# Patient Record
Sex: Male | Born: 1945 | Race: Black or African American | Hispanic: No | Smoking: Current every day smoker
Health system: Southern US, Community
[De-identification: ages and names within clinical notes are randomized; demographics above are authoritative.]

## PROBLEM LIST (undated history)

## (undated) DIAGNOSIS — L409 Psoriasis, unspecified: Secondary | ICD-10-CM

## (undated) DIAGNOSIS — F329 Major depressive disorder, single episode, unspecified: Secondary | ICD-10-CM

## (undated) DIAGNOSIS — E119 Type 2 diabetes mellitus without complications: Secondary | ICD-10-CM

## (undated) DIAGNOSIS — L989 Disorder of the skin and subcutaneous tissue, unspecified: Secondary | ICD-10-CM

## (undated) HISTORY — DX: Type 2 diabetes mellitus without complications: E11.9

## (undated) HISTORY — DX: Psoriasis, unspecified: L40.9

## (undated) HISTORY — DX: Major depressive disorder, single episode, unspecified: F32.9

---

## 1993-04-12 HISTORY — PX: COLONOSCOPY: SHX174

## 2011-09-29 ENCOUNTER — Ambulatory Visit: Payer: Self-pay | Admitting: Family Medicine

## 2012-05-11 ENCOUNTER — Emergency Department: Payer: Self-pay | Admitting: Emergency Medicine

## 2012-05-21 LAB — WOUND CULTURE

## 2014-09-16 ENCOUNTER — Encounter (INDEPENDENT_AMBULATORY_CARE_PROVIDER_SITE_OTHER): Payer: Self-pay

## 2014-09-16 ENCOUNTER — Encounter: Payer: Self-pay | Admitting: Internal Medicine

## 2014-09-16 ENCOUNTER — Ambulatory Visit (INDEPENDENT_AMBULATORY_CARE_PROVIDER_SITE_OTHER): Payer: PPO | Admitting: Internal Medicine

## 2014-09-16 VITALS — BP 136/70 | HR 104 | Ht 69.0 in | Wt 95.4 lb

## 2014-09-16 DIAGNOSIS — E119 Type 2 diabetes mellitus without complications: Secondary | ICD-10-CM

## 2014-09-16 DIAGNOSIS — R634 Abnormal weight loss: Secondary | ICD-10-CM | POA: Diagnosis not present

## 2014-09-16 DIAGNOSIS — L409 Psoriasis, unspecified: Secondary | ICD-10-CM

## 2014-09-16 MED ORDER — CEPHALEXIN 500 MG PO CAPS
500.0000 mg | ORAL_CAPSULE | Freq: Four times a day (QID) | ORAL | Status: DC
Start: 2014-09-16 — End: 2014-12-15

## 2014-09-16 MED ORDER — METFORMIN HCL 500 MG PO TABS: 500.0000 mg | ORAL_TABLET | Freq: Two times a day (BID) | ORAL | Status: DC

## 2014-09-16 NOTE — Progress Notes (Signed)
Date:  09/16/2014   Name:  Stephen [name].   DOB:  1946/03/14   MRN:  [id number]  History of Present Illness:  This is a 69 y.o. male who is presenting with bleeding.  He fell yesterday in his house and hit his left side.  He noticed bruising and bleeding on ribs.  Now there is tenderness but no bleeding. It hurts to breath but he does not think his ribs are broken.  Excessive weight loss - weight 128 lbs in 07/2013 (last visit).  Diabetes - currently on metformin daily.  He only checks BS occasionally - sometimes 130 often higher.  The highest recent reading is 190.  His weight is down 35 lbs which he attributes to poor appetite.  He does not have any financial issues that are contributing.  No results found for: HGBA1C  Psoriasis - severe involvement of both hands.  In the past only responded to keflex 500 mg qid for 2 weeks.  Currently his only applying onion juice.  Cortisone topically has not worked for him.  HPI  Review of Systems:  Review of Systems  Constitutional: Positive for appetite change and unexpected weight change.  Respiratory: Negative for cough, chest tightness and shortness of breath.   Gastrointestinal: Negative for nausea, vomiting, abdominal pain, diarrhea and blood in stool.  Skin: Positive for rash (thickening of both palms).  Neurological: Negative for syncope and light-headedness.  Psychiatric/Behavioral: Positive for sleep disturbance. Negative for dysphoric mood.    Patient Active Problem List   Diagnosis Date Noted  . Diabetes 09/16/2014  . Psoriasis 09/16/2014  . Excessive weight loss 09/16/2014    Prior to Admission medications   Medication Sig Start Date End Date Taking? Authorizing Provider  metFORMIN (GLUCOPHAGE) 500 MG tablet Take 1 mg by mouth 2 (two) times daily with a meal.   Yes Historical Provider, MD    No Known Allergies  No past surgical history on file.  History  Substance Use Topics  . Smoking status: Current Some Day Smoker    . Smokeless tobacco: Not on file  . Alcohol Use: 0.0 oz/week    0 Standard drinks or equivalent per week     Comment: socially     Medication list has been reviewed and updated.  Physical Examination:  Physical Exam  Constitutional: He is oriented to person, place, and time. He appears cachectic.  Neck: No thyromegaly present.  Cardiovascular: Normal rate, regular rhythm, normal heart sounds and intact distal pulses.   Pulmonary/Chest: No respiratory distress. He has no wheezes. He has no rales.  Abdominal: Soft. Bowel sounds are normal. He exhibits no mass. There is no tenderness.  Musculoskeletal: Normal range of motion. He exhibits tenderness (over left axillary ribs). He exhibits no edema.  Lymphadenopathy:    He has no cervical adenopathy.  Neurological: He is alert and oriented to person, place, and time.  Skin: No purpura noted. Rash is not pustular.       BP 136/70 mmHg  Pulse 104  Ht 5' 9"  (1.753 m)  Wt 95 lb 6.4 oz (43.273 kg)  BMI 14.08 kg/m2  Assessment and Plan: 1. Psoriasis Severe palmar involvement - pt encouraged to avoid prolonged occussion and stop onion juice - cephALEXin (KEFLEX) 500 MG capsule; Take 1 capsule (500 mg total) by mouth 4 (four) times daily.  Dispense: 40 capsule; Refill: 2 - CBC w/Diff  2. Type 2 diabetes mellitus without complication Likely very poor control with weight loss Pt is not concerned  but I explained that better control is needed - will add medication if needed - HgB A1c - Comp Met (CMET)  3. Excessive weight loss Patient attributes this to decreased appetite - TSH   [name], MD [facility]  09/16/2014   [name], MD [facility]  09/16/2014

## 2014-09-17 LAB — COMPREHENSIVE METABOLIC PANEL
ALBUMIN: 4.3 g/dL (ref 3.5–4.8)
ALT: 17 IU/L (ref 0–44)
AST: 21 IU/L (ref 0–40)
Albumin/Globulin Ratio: 1.9 (ref 1.1–2.5)
Alkaline Phosphatase: 106 IU/L (ref 39–117)
BUN / CREAT RATIO: 14 (ref 10–22)
BUN: 15 mg/dL (ref 8–27)
Bilirubin Total: 0.7 mg/dL (ref 0.0–1.2)
CALCIUM: 9.7 mg/dL (ref 8.6–10.2)
CO2: 23 mmol/L (ref 18–29)
CREATININE: 1.08 mg/dL (ref 0.76–1.27)
Chloride: 86 mmol/L — ABNORMAL LOW (ref 97–108)
GFR calc non Af Amer: 69 mL/min/{1.73_m2} (ref >59)
GFR, EST AFRICAN AMERICAN: 80 mL/min/{1.73_m2} (ref >59)
GLOBULIN, TOTAL: 2.3 g/dL (ref 1.5–4.5)
Glucose: 515 mg/dL (ref 65–99)
POTASSIUM: 5 mmol/L (ref 3.5–5.2)
Sodium: 132 mmol/L — ABNORMAL LOW (ref 134–144)
TOTAL PROTEIN: 6.6 g/dL (ref 6.0–8.5)

## 2014-09-17 LAB — CBC WITH DIFFERENTIAL/PLATELET
BASOS ABS: 0.1 10*3/uL (ref 0.0–0.2)
Basos: 1 %
EOS (ABSOLUTE): 0.1 10*3/uL (ref 0.0–0.4)
Eos: 1 %
Hematocrit: 50 % (ref 37.5–51.0)
Hemoglobin: 16.4 g/dL (ref 12.6–17.7)
Immature Grans (Abs): 0 10*3/uL (ref 0.0–0.1)
Immature Granulocytes: 0 %
Lymphocytes Absolute: 1.6 10*3/uL (ref 0.7–3.1)
Lymphs: 19 %
MCH: 31 pg (ref 26.6–33.0)
MCHC: 32.8 g/dL (ref 31.5–35.7)
MCV: 95 fL (ref 79–97)
MONOCYTES: 5 %
MONOS ABS: 0.4 10*3/uL (ref 0.1–0.9)
NEUTROS ABS: 6.3 10*3/uL (ref 1.4–7.0)
Neutrophils: 74 %
PLATELETS: 172 10*3/uL (ref 150–379)
RBC: 5.29 x10E6/uL (ref 4.14–5.80)
RDW: 13.6 % (ref 12.3–15.4)
WBC: 8.5 10*3/uL (ref 3.4–10.8)

## 2014-09-17 LAB — HEMOGLOBIN A1C
Est. average glucose Bld gHb Est-mCnc: 479 mg/dL
HEMOGLOBIN A1C: 18.3 % — AB (ref 4.8–5.6)

## 2014-09-18 ENCOUNTER — Encounter: Payer: Self-pay | Admitting: Internal Medicine

## 2014-09-18 DIAGNOSIS — IMO0002 Reserved for concepts with insufficient information to code with codable children: Secondary | ICD-10-CM | POA: Insufficient documentation

## 2014-09-18 DIAGNOSIS — E1165 Type 2 diabetes mellitus with hyperglycemia: Secondary | ICD-10-CM | POA: Insufficient documentation

## 2014-09-20 ENCOUNTER — Encounter: Payer: Self-pay | Admitting: Internal Medicine

## 2014-09-20 ENCOUNTER — Ambulatory Visit (INDEPENDENT_AMBULATORY_CARE_PROVIDER_SITE_OTHER): Payer: PPO | Admitting: Internal Medicine

## 2014-09-20 VITALS — BP 120/64 | HR 96 | Ht 70.0 in | Wt 97.2 lb

## 2014-09-20 DIAGNOSIS — IMO0002 Reserved for concepts with insufficient information to code with codable children: Secondary | ICD-10-CM

## 2014-09-20 DIAGNOSIS — L409 Psoriasis, unspecified: Secondary | ICD-10-CM | POA: Diagnosis not present

## 2014-09-20 DIAGNOSIS — E1165 Type 2 diabetes mellitus with hyperglycemia: Secondary | ICD-10-CM | POA: Diagnosis not present

## 2014-09-20 MED ORDER — "PEN NEEDLES 3/16"" 31G X 5 MM MISC": 1.0000 | Freq: Two times a day (BID) | Status: DC

## 2014-09-20 MED ORDER — CLOBETASOL PROPIONATE 0.05 % EX OINT: 1.0000 "application " | TOPICAL_OINTMENT | Freq: Two times a day (BID) | CUTANEOUS | Status: DC

## 2014-09-20 MED ORDER — INSULIN GLARGINE 100 UNIT/ML SOLOSTAR PEN: 5.0000 [IU] | PEN_INJECTOR | Freq: Two times a day (BID) | SUBCUTANEOUS | Status: DC

## 2014-09-20 NOTE — Progress Notes (Signed)
Date:  09/20/2014   Name:  Stephen [name].   DOB:  08-09-1945   MRN:  [id number]   Chief Complaint: Diabetes   History of Present Illness:  This is a 69 y.o. male who is presenting for diabetes. Diabetes He presents for his follow-up diabetic visit. He has type 2 diabetes mellitus. His disease course has been worsening. Hypoglycemia symptoms include confusion. Associated symptoms include fatigue, weakness and weight loss. Pertinent negatives for diabetes include no chest pain. Symptoms are worsening. There are no diabetic complications.   Lab Results  Component Value Date   HGBA1C 18.3* 09/16/2014     Review of Systems:  Review of Systems  Constitutional: Positive for weight loss, appetite change, fatigue and unexpected weight change.  Respiratory: Negative for chest tightness and shortness of breath.   Cardiovascular: Negative for chest pain.  Gastrointestinal: Negative for abdominal pain.  Musculoskeletal: Positive for arthralgias.  Skin: Positive for rash.  Neurological: Positive for weakness and numbness (both feet with dead dry skin).  Psychiatric/Behavioral: Positive for confusion.    Patient Active Problem List   Diagnosis Date Noted  . Diabetes mellitus type 2, uncontrolled 09/18/2014  . Psoriasis 09/16/2014  . Excessive weight loss 09/16/2014    Prior to Admission medications   Medication Sig Start Date End Date Taking? Authorizing Provider  cephALEXin (KEFLEX) 500 MG capsule Take 1 capsule (500 mg total) by mouth 4 (four) times daily. 09/16/14  Yes Glean Hess, MD  metFORMIN (GLUCOPHAGE) 500 MG tablet Take 1 tablet (500 mg total) by mouth 2 (two) times daily with a meal. 09/16/14  Yes Glean Hess, MD    No Known Allergies  Past Surgical History  Procedure Laterality Date  . Colonoscopy  1995    one polyp    History  Substance Use Topics  . Smoking status: Current Every Day Smoker  . Smokeless tobacco: Not on file     Comment: patient not  ready to quit  . Alcohol Use: 0.0 oz/week    0 Standard drinks or equivalent per week     Comment: socially     Medication list has been reviewed and updated.  Physical Examination:  Physical Exam  Constitutional: He is oriented to person, place, and time. He appears well-developed. No distress.  HENT:  Head: Normocephalic and atraumatic.  Eyes: Conjunctivae are normal. Right eye exhibits no discharge. Left eye exhibits no discharge. No scleral icterus.  Pulmonary/Chest: Effort normal. No respiratory distress.  Musculoskeletal: Normal range of motion.  Neurological: He is alert and oriented to person, place, and time.  Skin: Rash noted.  Thick moist peeling skin on palms Thick dry skin on feet  Psychiatric: He has a normal mood and affect. His behavior is normal. Thought content normal.  Nursing note and vitals reviewed.   BP 120/64 mmHg  Pulse 96  Ht 5\' 10"  (1.778 m)  Wt 97 lb 3.2 oz (44.09 kg)  BMI 13.95 kg/m2  Assessment and Plan: 1. Diabetes mellitus type 2, uncontrolled Need to resume insulin therapy - will give lantus bid starting with low dose and titrate up to 15 units bid - Insulin Glargine (LANTUS SOLOSTAR) 100 UNIT/ML Solostar Pen; Inject 5 Units into the skin 2 (two) times daily.  Dispense: 5 pen; Refill: PRN - Insulin Pen Needle (PEN NEEDLES 3/16") 31G X 5 MM MISC; 1 each by Does not apply route 2 (two) times daily.  Dispense: 100 each; Refill: 3  2. Psoriasis Involving feet and hands  -  clobetasol ointment (TEMOVATE) 0.05 %; Apply 1 application topically 2 (two) times daily.  Dispense: 30 g; Refill: 0   [name], MD [facility]  09/20/2014

## 2014-09-20 NOTE — Patient Instructions (Signed)
Increase insulin each month - 5 units twice a day for one month, then 10 units twice a day for one month, then 15 units twice a day until return visit.

## 2014-11-05 ENCOUNTER — Other Ambulatory Visit: Payer: Self-pay | Admitting: Internal Medicine

## 2014-12-14 ENCOUNTER — Encounter: Payer: Self-pay | Admitting: *Deleted

## 2014-12-14 ENCOUNTER — Inpatient Hospital Stay
Admission: EM | Admit: 2014-12-14 | Discharge: 2014-12-15 | DRG: 638 | Disposition: A | Discharge status: Home / Self Care | Payer: PPO | Attending: Internal Medicine | Admitting: Internal Medicine

## 2014-12-14 ENCOUNTER — Inpatient Hospital Stay: Payer: PPO

## 2014-12-14 DIAGNOSIS — Z794 Long term (current) use of insulin: Secondary | ICD-10-CM | POA: Diagnosis not present

## 2014-12-14 DIAGNOSIS — L601 Onycholysis: Secondary | ICD-10-CM | POA: Diagnosis present

## 2014-12-14 DIAGNOSIS — E1100 Type 2 diabetes mellitus with hyperosmolarity without nonketotic hyperglycemic-hyperosmolar coma (NKHHC): Principal | ICD-10-CM | POA: Diagnosis present

## 2014-12-14 DIAGNOSIS — I7090 Unspecified atherosclerosis: Secondary | ICD-10-CM | POA: Diagnosis present

## 2014-12-14 DIAGNOSIS — R21 Rash and other nonspecific skin eruption: Secondary | ICD-10-CM | POA: Diagnosis present

## 2014-12-14 DIAGNOSIS — E871 Hypo-osmolality and hyponatremia: Secondary | ICD-10-CM | POA: Diagnosis present

## 2014-12-14 DIAGNOSIS — E46 Unspecified protein-calorie malnutrition: Secondary | ICD-10-CM | POA: Diagnosis present

## 2014-12-14 DIAGNOSIS — F1010 Alcohol abuse, uncomplicated: Secondary | ICD-10-CM

## 2014-12-14 DIAGNOSIS — F17210 Nicotine dependence, cigarettes, uncomplicated: Secondary | ICD-10-CM | POA: Diagnosis present

## 2014-12-14 DIAGNOSIS — F22 Delusional disorders: Secondary | ICD-10-CM | POA: Diagnosis present

## 2014-12-14 DIAGNOSIS — E1165 Type 2 diabetes mellitus with hyperglycemia: Secondary | ICD-10-CM | POA: Diagnosis present

## 2014-12-14 DIAGNOSIS — Z720 Tobacco use: Secondary | ICD-10-CM

## 2014-12-14 DIAGNOSIS — D696 Thrombocytopenia, unspecified: Secondary | ICD-10-CM

## 2014-12-14 DIAGNOSIS — Z79899 Other long term (current) drug therapy: Secondary | ICD-10-CM | POA: Diagnosis not present

## 2014-12-14 DIAGNOSIS — K8020 Calculus of gallbladder without cholecystitis without obstruction: Secondary | ICD-10-CM | POA: Diagnosis present

## 2014-12-14 DIAGNOSIS — D6959 Other secondary thrombocytopenia: Secondary | ICD-10-CM | POA: Diagnosis present

## 2014-12-14 DIAGNOSIS — R1900 Intra-abdominal and pelvic swelling, mass and lump, unspecified site: Secondary | ICD-10-CM | POA: Diagnosis present

## 2014-12-14 DIAGNOSIS — L299 Pruritus, unspecified: Secondary | ICD-10-CM | POA: Diagnosis present

## 2014-12-14 DIAGNOSIS — L409 Psoriasis, unspecified: Secondary | ICD-10-CM | POA: Diagnosis present

## 2014-12-14 DIAGNOSIS — R739 Hyperglycemia, unspecified: Secondary | ICD-10-CM

## 2014-12-14 DIAGNOSIS — K861 Other chronic pancreatitis: Secondary | ICD-10-CM | POA: Diagnosis present

## 2014-12-14 DIAGNOSIS — K3189 Other diseases of stomach and duodenum: Secondary | ICD-10-CM

## 2014-12-14 DIAGNOSIS — E860 Dehydration: Secondary | ICD-10-CM | POA: Diagnosis present

## 2014-12-14 DIAGNOSIS — R634 Abnormal weight loss: Secondary | ICD-10-CM

## 2014-12-14 DIAGNOSIS — L509 Urticaria, unspecified: Secondary | ICD-10-CM

## 2014-12-14 DIAGNOSIS — Z681 Body mass index (BMI) 19 or less, adult: Secondary | ICD-10-CM

## 2014-12-14 LAB — URINALYSIS COMPLETE WITH MICROSCOPIC (ARMC ONLY)
BACTERIA UA: NONE SEEN
BILIRUBIN URINE: NEGATIVE
Glucose, UA: >500 mg/dL — AB
Hgb urine dipstick: NEGATIVE
Ketones, ur: NEGATIVE mg/dL
LEUKOCYTES UA: NEGATIVE
Nitrite: NEGATIVE
PH: 6 (ref 5.0–8.0)
PROTEIN: NEGATIVE mg/dL
RBC / HPF: NONE SEEN RBC/hpf (ref 0–5)
SQUAMOUS EPITHELIAL / LPF: NONE SEEN
Specific Gravity, Urine: 1.026 (ref 1.005–1.030)
WBC, UA: NONE SEEN WBC/hpf (ref 0–5)

## 2014-12-14 LAB — COMPREHENSIVE METABOLIC PANEL
ALT: 18 U/L (ref 17–63)
ANION GAP: 11 (ref 5–15)
AST: 34 U/L (ref 15–41)
Albumin: 3.8 g/dL (ref 3.5–5.0)
Alkaline Phosphatase: 117 U/L (ref 38–126)
BILIRUBIN TOTAL: 1 mg/dL (ref 0.3–1.2)
BUN: 28 mg/dL — ABNORMAL HIGH (ref 6–20)
CALCIUM: 9.4 mg/dL (ref 8.9–10.3)
CO2: 29 mmol/L (ref 22–32)
CREATININE: 0.98 mg/dL (ref 0.61–1.24)
Chloride: 84 mmol/L — ABNORMAL LOW (ref 101–111)
GFR calc non Af Amer: >60 mL/min (ref >60)
GLUCOSE: 745 mg/dL — AB (ref 65–99)
Potassium: 5 mmol/L (ref 3.5–5.1)
Sodium: 124 mmol/L — ABNORMAL LOW (ref 135–145)
TOTAL PROTEIN: 6.5 g/dL (ref 6.5–8.1)

## 2014-12-14 LAB — GLUCOSE, CAPILLARY
GLUCOSE-CAPILLARY: 453 mg/dL — AB (ref 65–99)
GLUCOSE-CAPILLARY: 313 mg/dL — AB (ref 65–99)
GLUCOSE-CAPILLARY: 181 mg/dL — AB (ref 65–99)
GLUCOSE-CAPILLARY: 128 mg/dL — AB (ref 65–99)
GLUCOSE-CAPILLARY: 158 mg/dL — AB (ref 65–99)
GLUCOSE-CAPILLARY: 167 mg/dL — AB (ref 65–99)
GLUCOSE-CAPILLARY: 216 mg/dL — AB (ref 65–99)
GLUCOSE-CAPILLARY: 205 mg/dL — AB (ref 65–99)
GLUCOSE-CAPILLARY: 138 mg/dL — AB (ref 65–99)
Glucose-Capillary: 178 mg/dL — ABNORMAL HIGH (ref 65–99)
Glucose-Capillary: 194 mg/dL — ABNORMAL HIGH (ref 65–99)
Glucose-Capillary: 73 mg/dL (ref 65–99)

## 2014-12-14 LAB — HEMOGLOBIN A1C: HEMOGLOBIN A1C: 17 % — AB (ref 4.0–6.0)

## 2014-12-14 LAB — CBC
HEMATOCRIT: 46.8 % (ref 40.0–52.0)
HEMOGLOBIN: 15.8 g/dL (ref 13.0–18.0)
MCH: 32.3 pg (ref 26.0–34.0)
MCHC: 33.7 g/dL (ref 32.0–36.0)
MCV: 95.7 fL (ref 80.0–100.0)
Platelets: 124 10*3/uL — ABNORMAL LOW (ref 150–440)
RBC: 4.89 MIL/uL (ref 4.40–5.90)
RDW: 13.1 % (ref 11.5–14.5)
WBC: 8.5 10*3/uL (ref 3.8–10.6)

## 2014-12-14 LAB — BASIC METABOLIC PANEL
ANION GAP: 9 (ref 5–15)
BUN: 24 mg/dL — ABNORMAL HIGH (ref 6–20)
CHLORIDE: 91 mmol/L — AB (ref 101–111)
CO2: 29 mmol/L (ref 22–32)
CREATININE: 0.82 mg/dL (ref 0.61–1.24)
Calcium: 8.8 mg/dL — ABNORMAL LOW (ref 8.9–10.3)
GFR calc non Af Amer: >60 mL/min (ref >60)
Glucose, Bld: 420 mg/dL — ABNORMAL HIGH (ref 65–99)
POTASSIUM: 4.7 mmol/L (ref 3.5–5.1)
Sodium: 129 mmol/L — ABNORMAL LOW (ref 135–145)

## 2014-12-14 LAB — PHOSPHORUS: PHOSPHORUS: 4.8 mg/dL — AB (ref 2.5–4.6)

## 2014-12-14 LAB — PROTIME-INR
INR: 0.92
Prothrombin Time: 12.6 seconds (ref 11.4–15.0)

## 2014-12-14 LAB — MAGNESIUM: Magnesium: 2.3 mg/dL (ref 1.7–2.4)

## 2014-12-14 LAB — OSMOLALITY: OSMOLALITY: 309 mosm/kg — AB (ref 275–295)

## 2014-12-14 LAB — AMMONIA: AMMONIA: 9 umol/L (ref 9–35)

## 2014-12-14 LAB — MRSA PCR SCREENING: MRSA BY PCR: NEGATIVE

## 2014-12-14 LAB — TROPONIN I: Troponin I: <0.03 ng/mL (ref <0.031)

## 2014-12-14 MED ORDER — ENOXAPARIN SODIUM 30 MG/0.3ML ~~LOC~~ SOLN
30.0000 mg | SUBCUTANEOUS | Status: DC
  Administered 2014-12-15: 09:00:00 30 mg via SUBCUTANEOUS
  Filled 2014-12-14: qty 0.3

## 2014-12-14 MED ORDER — NICOTINE 14 MG/24HR TD PT24
14.0000 mg | MEDICATED_PATCH | Freq: Every day | TRANSDERMAL | Status: DC
  Administered 2014-12-14 – 2014-12-15 (×2): 14 mg via TRANSDERMAL
  Filled 2014-12-14 (×2): qty 1

## 2014-12-14 MED ORDER — CALAMINE EX LOTN
TOPICAL_LOTION | CUTANEOUS | Status: DC | PRN
  Filled 2014-12-14: qty 118

## 2014-12-14 MED ORDER — INSULIN ASPART 100 UNIT/ML ~~LOC~~ SOLN: 0.0000 [IU] | SUBCUTANEOUS | Status: DC

## 2014-12-14 MED ORDER — SODIUM CHLORIDE 0.9 % IV BOLUS (SEPSIS)
1000.0000 mL | Freq: Once | INTRAVENOUS | Status: AC
  Administered 2014-12-14: 1000 mL via INTRAVENOUS

## 2014-12-14 MED ORDER — DEXTROSE 50 % IV SOLN: 25.0000 mL | INTRAVENOUS | Status: DC | PRN

## 2014-12-14 MED ORDER — INSULIN REGULAR BOLUS VIA INFUSION
0.0000 [IU] | Freq: Three times a day (TID) | INTRAVENOUS | Status: DC
  Administered 2014-12-14: 0.3 [IU] via INTRAVENOUS
  Filled 2014-12-14: qty 10

## 2014-12-14 MED ORDER — SODIUM CHLORIDE 0.9 % IV SOLN
INTRAVENOUS | Status: DC
  Administered 2014-12-14: 0.5 [IU]/h via INTRAVENOUS
  Administered 2014-12-14: 3.9 [IU]/h via INTRAVENOUS
  Filled 2014-12-14: qty 2.5

## 2014-12-14 MED ORDER — INSULIN ASPART 100 UNIT/ML ~~LOC~~ SOLN
0.0000 [IU] | Freq: Three times a day (TID) | SUBCUTANEOUS | Status: DC
  Administered 2014-12-15: 5 [IU] via SUBCUTANEOUS
  Filled 2014-12-14: qty 5

## 2014-12-14 MED ORDER — SODIUM CHLORIDE 0.9 % IJ SOLN
3.0000 mL | Freq: Two times a day (BID) | INTRAMUSCULAR | Status: DC
  Administered 2014-12-14 – 2014-12-15 (×2): 3 mL via INTRAVENOUS

## 2014-12-14 MED ORDER — DIPHENHYDRAMINE HCL 25 MG PO CAPS: 25.0000 mg | ORAL_CAPSULE | ORAL | Status: DC | PRN

## 2014-12-14 MED ORDER — INSULIN GLARGINE 100 UNIT/ML ~~LOC~~ SOLN
6.0000 [IU] | Freq: Once | SUBCUTANEOUS | Status: AC
  Administered 2014-12-14: 6 [IU] via SUBCUTANEOUS
  Filled 2014-12-14: qty 0.06

## 2014-12-14 MED ORDER — SODIUM CHLORIDE 0.9 % IV SOLN
INTRAVENOUS | Status: AC
  Administered 2014-12-14: 23:00:00 via INTRAVENOUS

## 2014-12-14 MED ORDER — IOHEXOL 240 MG/ML SOLN
25.0000 mL | INTRAMUSCULAR | Status: AC
  Administered 2014-12-14: 25 mL via ORAL
  Administered 2014-12-14: 50 mL via ORAL

## 2014-12-14 MED ORDER — SODIUM CHLORIDE 0.9 % IV SOLN
INTRAVENOUS | Status: DC
  Administered 2014-12-14: 12:00:00 via INTRAVENOUS

## 2014-12-14 MED ORDER — ENOXAPARIN SODIUM 40 MG/0.4ML ~~LOC~~ SOLN
40.0000 mg | SUBCUTANEOUS | Status: DC
  Administered 2014-12-14: 40 mg via SUBCUTANEOUS
  Filled 2014-12-14: qty 0.4

## 2014-12-14 MED ORDER — FAMOTIDINE IN NACL 20-0.9 MG/50ML-% IV SOLN
20.0000 mg | Freq: Once | INTRAVENOUS | Status: AC
  Administered 2014-12-14: 20 mg via INTRAVENOUS
  Filled 2014-12-14: qty 50

## 2014-12-14 MED ORDER — INSULIN GLARGINE 100 UNIT/ML ~~LOC~~ SOLN
12.0000 [IU] | Freq: Every day | SUBCUTANEOUS | Status: DC
  Administered 2014-12-14: 12 [IU] via SUBCUTANEOUS
  Filled 2014-12-14 (×2): qty 0.12

## 2014-12-14 MED ORDER — CLOBETASOL PROPIONATE 0.05 % EX CREA
TOPICAL_CREAM | Freq: Two times a day (BID) | CUTANEOUS | Status: DC
  Administered 2014-12-14 (×2): via TOPICAL
  Filled 2014-12-14: qty 15

## 2014-12-14 MED ORDER — CLOBETASOL PROPIONATE 0.05 % EX OINT
TOPICAL_OINTMENT | Freq: Two times a day (BID) | CUTANEOUS | Status: DC
  Filled 2014-12-14: qty 15

## 2014-12-14 MED ORDER — DEXTROSE-NACL 5-0.45 % IV SOLN
INTRAVENOUS | Status: DC
  Administered 2014-12-14: 13:00:00 via INTRAVENOUS

## 2014-12-14 MED ORDER — INSULIN ASPART 100 UNIT/ML ~~LOC~~ SOLN: 0.0000 [IU] | Freq: Every day | SUBCUTANEOUS | Status: DC

## 2014-12-14 MED ORDER — DIPHENHYDRAMINE HCL 50 MG/ML IJ SOLN
25.0000 mg | Freq: Once | INTRAMUSCULAR | Status: AC
  Administered 2014-12-14: 25 mg via INTRAVENOUS
  Filled 2014-12-14: qty 1

## 2014-12-14 MED ORDER — NICOTINE 10 MG IN INHA: 1.0000 | RESPIRATORY_TRACT | Status: DC | PRN

## 2014-12-14 NOTE — H&P (Signed)
[facility] [place] NAME: Stephen [name]    MR#:  [id number]  DATE OF BIRTH:  July 17, 1945  DATE OF ADMISSION:  12/14/2014  PRIMARY CARE PHYSICIAN: [name], MD   REQUESTING/REFERRING PHYSICIAN:   CHIEF COMPLAINT:   Chief Complaint  Patient presents with  . Rash    HISTORY OF PRESENT ILLNESS: Stephen [name]  is a 69 y.o. male with a known history of diabetes mellitus, which was not treated, questionable psoriasis , who presents to the hospital with complains of rash in upper extremities. Apparently patient has been having this rash for while, although it is difficult to know how long he's been having it because of patient's inability to provide information, however, his rash seemed to exacerbate over the past 3 or 4 days. Patient describes the little bumps on his hands and his anterior forearms , itchy rash and gives him significant discomfort and pain whenever he scratches it. He thinks that it is a parasite in his body. While in the hospital. He was noted to have a higher glucose levels at around 700s. He tells me that he was on insulin at one point, he took only for week and that his blood glucose level dropped down, so he stopped his medication. Hospitalist services were contacted for admission. Patient admits of losing weight, poor appetite  PAST MEDICAL HISTORY:   Past Medical History  Diagnosis Date  . Diabetes mellitus without complication   . Psoriasis     PAST SURGICAL HISTORY:  Past Surgical History  Procedure Laterality Date  . Colonoscopy  1995    one polyp    SOCIAL HISTORY:  Social History  Substance Use Topics  . Smoking status: Current Every Day Smoker  . Smokeless tobacco: Not on file     Comment: patient not ready to quit  . Alcohol Use: 0.0 oz/week    0 Standard drinks or equivalent per week     Comment: socially    FAMILY HISTORY:  Family History  Problem Relation Age of Onset  . Migraines  Father     DRUG ALLERGIES: No Known Allergies  Review of Systems  Constitutional: Positive for weight loss. Negative for fever, chills and malaise/fatigue.  HENT: Negative for congestion.   Eyes: Negative for blurred vision and double vision.  Respiratory: Positive for cough and sputum production. Negative for shortness of breath and wheezing.   Cardiovascular: Negative for chest pain, palpitations, orthopnea, leg swelling and PND.  Gastrointestinal: Positive for constipation and blood in stool. Negative for nausea, vomiting, abdominal pain, diarrhea and melena.  Genitourinary: Positive for frequency. Negative for dysuria, urgency and hematuria.  Musculoskeletal: Negative for falls.  Skin: Positive for itching and rash.  Neurological: Negative for dizziness and weakness.  Psychiatric/Behavioral: Negative for depression and memory loss. The patient is not nervous/anxious.     MEDICATIONS AT HOME:  Prior to Admission medications   Medication Sig Start Date End Date Taking? Authorizing Provider  cephALEXin (KEFLEX) 500 MG capsule Take 1 capsule (500 mg total) by mouth 4 (four) times daily. 09/16/14  Yes Glean Hess, MD  clobetasol ointment (TEMOVATE) 0.05 % APPLY TO AFFECTED AREA 2 (TWO) TIMES DAILY. 11/06/14  Yes Glean Hess, MD  metFORMIN (GLUCOPHAGE) 500 MG tablet Take 1 tablet (500 mg total) by mouth 2 (two) times daily with a meal. 09/16/14  Yes Glean Hess, MD      PHYSICAL EXAMINATION:   VITAL SIGNS: Blood pressure 124/88,  pulse 87, temperature 98.1 F (36.7 C), temperature source Oral, resp. rate 18, height 5\' 4"  (1.626 m), weight 40.824 kg (90 lb), SpO2 100 %.  GENERAL:  69 y.o.-year-old patient lying in the bed with no acute distress, reluctant to talk and covers his head into the blankets . Marland Kitchen  EYES: Pupils equal, round, reactive to light and accommodation. No scleral icterus. Extraocular muscles intact.  HEENT: Head atraumatic, normocephalic. Oropharynx and  nasopharynx clear.  NECK:  Supple, no jugular venous distention. No thyroid enlargement, no tenderness.  LUNGS: Markedly diminished breath sounds bilaterally, intermittent scattered wheezing, no rales, few  rhonchi , . No crepitation. No use of accessory muscles of respiration.  CARDIOVASCULAR: S1, S2 normal. No murmurs, rubs, or gallops. Distant  ABDOMEN: Soft, nontender, nondistended. Bowel sounds present. No organomegaly or mass.  EXTREMITIES: No pedal edema, cyanosis, or clubbing.  NEUROLOGIC: Cranial nerves II through XII are intact. Muscle strength 5/5 in all extremities. Sensation intact. Gait not checked.  PSYCHIATRIC: The patient is alert and oriented x 3. Talks about parasites living in his body and causing him rash  SKIN:Very fine sandpaper like  rash in inner forearms,  peeling of the skin of his fingers and dark, thickened callus looking lesions in the palms. Patient scratches his forearms. Excoriations were noted  LABORATORY PANEL:   CBC  Recent Labs Lab 12/14/14 0710  WBC 8.5  HGB 15.8  HCT 46.8  PLT 124*  MCV 95.7  MCH 32.3  MCHC 33.7  RDW 13.1   ------------------------------------------------------------------------------------------------------------------  Chemistries   Recent Labs Lab 12/14/14 0710  NA 124*  K 5.0  CL 84*  CO2 29  GLUCOSE 745*  BUN 28*  CREATININE 0.98  CALCIUM 9.4  MG 2.3  AST 34  ALT 18  ALKPHOS 117  BILITOT 1.0   ------------------------------------------------------------------------------------------------------------------  Cardiac Enzymes  Recent Labs Lab 12/14/14 0710  TROPONINI <0.03   ------------------------------------------------------------------------------------------------------------------  RADIOLOGY: No results found.  EKG: No orders found for this or any previous visit.  IMPRESSION AND PLAN:  Active Problems:   Hyperosmolar non-ketotic state in patient with type 2 diabetes mellitus   Rash 1.  Hyperosmolar nonketotic state in patient with diabetes mellitus type 2, admitted patient to intensive care unit for insulin IV drip. Get a hemoglobin A1c, initiate him on IV fluids, follow blood glucose levels and transition to insulin Lantus. Get diabetic education 2. Rash, unclear etiology. Continue patient on steroids topically, get dermatologist involved and get more information from primary care physician 3. Altered mental status, questionable patient's baseline psychiatric disease, we will get psychiatrist involved for recommendations. Get ammonia level 4. Hyponatremia. Continue to follow with IV fluid administration, likely dehydration related 5. Thrombocytopenia, get pro time INR, questionable baseline, possibly alcohol abuse related 6. Alcohol abuse. Watch for withdrawal. Initiate CIWA scale if needed 7. Tobacco abuse. Discussed this patient for approximately 4 minutes. Nicotine replacement therapy will be initiated      All the records are reviewed and case discussed with ED provider. Management plans discussed with the patient, family and they are in agreement.  CODE STATUS:  Full code  TOTAL TIME TAKING CARE OF THIS PATIENT: 60 minutes.  Discussed with patient's brother  Theodoro Grist M.D on 12/14/2014 at 9:47 AM  Between 7am to 6pm - Pager - [phone] After 6pm go to [email or web address] - password [email or web address] [facility]  Office  [phone]  CC: Primary care physician; [name], MD

## 2014-12-14 NOTE — ED Provider Notes (Signed)
Patient was turned over my my colleague Dr. Dahlia Client. Patient was found to be significantly hyperglycemic without indications of obvious nonketotic coma at this time. He does not have any bones in his urine. Patient appears Cachectic and dehydrated. I started patient on IV fluid bolus and subcutaneous insulin. Serum osmolality was added. Patient's case was reviewed with the hospitalist team, further disposition and management depends upon their evaluation likely admission. The patient's diffuse rash which was pruritic and seemed to be of allergy for age and has resolved with Benadryl. The source of his allergic reaction is unknown at this time.  Stephen Larsen, MD 12/14/14 [phone]

## 2014-12-14 NOTE — Consult Note (Signed)
[facility] Face-to-Face Psychiatry Consult   Reason for Consult:  Patient believes his body is infected by parasites.  Referring Physician:  Theodoro Grist, MD  Patient Identification: Stephen [name]. MRN:  [id number] Principal Diagnosis: Delusional disorder Diagnosis:   Patient Active Problem List   Diagnosis Date Noted  . Hyperosmolar non-ketotic state in patient with type 2 diabetes mellitus [E11.01] 12/14/2014  . Rash [R21] 12/14/2014  . Delusional disorder, unspecified type [F22] 12/14/2014  . Diabetes mellitus type 2, uncontrolled [E11.65] 09/18/2014  . Psoriasis [L40.9] 09/16/2014  . Excessive weight loss [R63.4] 09/16/2014    Total Time spent with patient: 30 minutes  Subjective:   Stephen Reeves. is a 69 y.o. male patient admitted with Hyperosmolar non-ketotic state in patient with type 2 diabetes mellitus.  Per ED physician notes, The pt was brought in to the ED by a friend for weight loss and a rash over his entire body. The pt reported developing a rash that is pruritic and painful on his arms, legs and abdomen. He has some peeling skin on his hands; this has been ongoing for 4-5 years.  He is drinking only Ensure and has a decreased appetite.  At home, he wore medical gloves and puts onions in them to help draw out any parasite or infection.  Stephen [name] told Dr. Dahlia [name], he thought something was laying eggs in his hands.  Later during the ED visit, the pt was found to be significantly hyperglycemic. Pt appeared cachetic and dehydrated. He was started on IV fluid bolus with subcutaneous insulin.  Pt also had a rash which resolved with diphenhydramine use. His serum osmolality on 12-13-14 was 309. Serum glucose was 745 with a BUN of 28. Creatinine was WNL of 0.98. Sodium was low at 124. Serum sodium at 10:19am on 12-14-14 has increased to 129. Glucose at the same decreased to 420.  At 12:18pm today, the pt's serum glucose decreased to 181.   12-14-2014 CT abdomen and Pelvis revealed the  following: IMPRESSION: 1. Mass-like thickening throughout the gastric wall in the region of the body of the stomach, concerning for potential infiltrative gastric neoplasm. This is poorly evaluated on today's non-contrast CT examination, particularly given the under distention of the stomach. However, further evaluation with non-emergent endoscopy is recommended in the near future to exclude neoplasm. 2. Sequela of chronic pancreatitis - Pancreas: Pancreas is atrophic and densely calcified, likely sequela of chronic pancreatitis. 3. Cholelithiasis without evidence of acute cholecystitis at this time. 4. Extensive atherosclerosis.  Today on interview, Stephen [name] states he is doing well. He denies having any psychiatric history and notes that he saw a psychiatrist/therapist once when he was getting a divorce from his wife. The pt is not certain why psychiatry is visiting him today and when explained to the pt about concerns that he believes he is infected with parasites, the pt understood. Stephen [name] shared that he used to get drunk frequently when he was younger. He would pass out in various locations including outside in the woods. He shared he felt he picked up a parasite when he was caring for his mother some years ago in [place], [place].   When he began talking about the parasites, he felt the palms of his and feet were affected the most severely. He showed this provider his hands and the palms of his hands are uniformly dark, with darker patches. The volar skin on his thumbs and index fingers is peeling. The skin on his right middle finger is  peeling as well. Onycholysis is present with scaring of the knuckles on both hands. The left pinky digit is somewhat be fixed with tightening of the skin over that digit. The pt states this started 5 years ago and since he believed he had an infection of parasites in his hands, he wore medical gloves and placed an onion inside of the glove to help  "Draw out" the infection.   The then pointed to his chin and showed this provider a small well healed scar on the right side of his chin, just lateral the midline where he noted a long, clear worm came out of that scar about 5 years ago. The pt did share when this happened he was intoxicated. He also shared that he did not keep the "worm" which came out of the hole but he has not seen anything like it since that time.   Stephen [name] discussed he washes his hands frequently due to his upbringing. He denies the an obsession and compulsion with handwashing. He denies other symptoms of OCD as well. He also shared feeling like something is crawling underneath his skin at times. He notes when he places onions in the gloves on his hands, small holes will open (about a 16th of an inch) and he will squeeze those holes and whitish puss will be released.  He denies seeing parasites coming from his hands.  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   The pt denies depression, mania, anxiety, paranoia, thought insertion, thought withdrawal, thought broadcasting and feeling as thought his body is under external control. Stephen [name] denies SI, HI and AVH. He shared he is happy to be alive and understood why he needed to be hospitalized at this time.   HPI:  See above HPI Elements:   Quality:  gradually improving. Severity:  mild. Timing:  since being hospitalized. Duration:  at least 5 years. Context:  uncontrolled type 2 DM.  Past Medical History:  Past Medical History  Diagnosis Date  . Diabetes mellitus without complication   . Psoriasis     Past Surgical History  Procedure Laterality Date  . Colonoscopy  1995    one polyp   Family History:  Family History  Problem Relation Age of Onset  . Migraines Father    Social History:  History   Alcohol Use  . 0.0 oz/week  . 0 Standard drinks or equivalent per week    Comment: socially     History  Drug Use  . 2.00 per week    Comment: marijuana    Social History   Social History  . Marital Status: Single    Spouse Name: N/A  . Number of Children: N/A  . Years of Education: N/A   Social History Main Topics  . Smoking status: Current Every Day Smoker  . Smokeless tobacco: Not on file     Comment: patient not ready to quit  . Alcohol Use: 0.0 oz/week    0 Standard drinks or equivalent per week     Comment: socially  . Drug Use: 2.00 per week     Comment: marijuana  . Sexual Activity: Not on file   Other Topics Concern  . Not on file   Social History Narrative   Additional Social History: Lives alone.   Allergies:  No  Known Allergies  Labs:  Results for orders placed or performed during the hospital encounter of 12/14/14 (from the past 48 hour(s))  CBC     Status: Abnormal   Collection Time: 12/14/14  7:10 AM  Result Value Ref Range   WBC 8.5 3.8 - 10.6 K/uL   RBC 4.89 4.40 - 5.90 MIL/uL   Hemoglobin 15.8 13.0 - 18.0 g/dL   HCT 46.8 40.0 - 52.0 %   MCV 95.7 80.0 - 100.0 fL   MCH 32.3 26.0 - 34.0 pg   MCHC 33.7 32.0 - 36.0 g/dL   RDW 13.1 11.5 - 14.5 %   Platelets 124 (L) 150 - 440 K/uL  Comprehensive metabolic panel     Status: Abnormal   Collection Time: 12/14/14  7:10 AM  Result Value Ref Range   Sodium 124 (L) 135 - 145 mmol/L   Potassium 5.0 3.5 - 5.1 mmol/L   Chloride 84 (L) 101 - 111 mmol/L   CO2 29 22 - 32 mmol/L   Glucose, Bld 745 (HH) 65 - 99 mg/dL    Comment: CRITICAL RESULT CALLED TO, READ BACK BY AND VERIFIED WITH DERRICK POTTS AT 1610 12/14/14 DAS    BUN 28 (H) 6 - 20 mg/dL   Creatinine, Ser 0.98 0.61 - 1.24 mg/dL   Calcium 9.4 8.9 - 10.3 mg/dL   Total Protein 6.5 6.5 - 8.1 g/dL   Albumin 3.8 3.5 - 5.0 g/dL   AST 34 15 - 41 U/L   ALT 18 17 - 63 U/L   Alkaline Phosphatase 117 38 - 126 U/L   Total Bilirubin 1.0 0.3 - 1.2 mg/dL    GFR calc non Af Amer >60 >60 mL/min   GFR calc Af Amer >60 >60 mL/min    Comment: (NOTE) The eGFR has been calculated using the CKD EPI equation. This calculation has not been validated in all clinical situations. eGFR's persistently <60 mL/min signify possible Chronic Kidney Disease.    Anion gap 11 5 - 15  Troponin I     Status: None   Collection Time: 12/14/14  7:10 AM  Result Value Ref Range   Troponin I <0.03 <0.031 ng/mL    Comment:        NO INDICATION OF MYOCARDIAL INJURY.   Urinalysis complete, with microscopic ([facility] only)     Status: Abnormal   Collection Time: 12/14/14  7:10 AM  Result Value Ref Range   Color, Urine STRAW (A) YELLOW   APPearance CLEAR (A) CLEAR   Glucose, UA >500 (A) NEGATIVE mg/dL   Bilirubin Urine NEGATIVE NEGATIVE   Ketones, ur NEGATIVE NEGATIVE mg/dL   Specific Gravity, Urine 1.026 1.005 - 1.030   Hgb urine dipstick NEGATIVE NEGATIVE   pH 6.0 5.0 - 8.0   Protein, ur NEGATIVE NEGATIVE mg/dL   Nitrite NEGATIVE NEGATIVE   Leukocytes, UA NEGATIVE NEGATIVE   RBC / HPF NONE SEEN 0 - 5 RBC/hpf   WBC, UA NONE SEEN 0 - 5 WBC/hpf   Bacteria, UA NONE SEEN NONE SEEN   Squamous Epithelial / LPF NONE SEEN NONE SEEN  Magnesium     Status: None   Collection Time: 12/14/14  7:10 AM  Result Value Ref Range   Magnesium 2.3 1.7 - 2.4 mg/dL  Phosphorus     Status: Abnormal   Collection Time: 12/14/14  7:10 AM  Result Value Ref Range   Phosphorus 4.8 (H) 2.5 - 4.6 mg/dL  Osmolality     Status: Abnormal   Collection  Time: 12/14/14  7:10 AM  Result Value Ref Range   Osmolality 309 (H) 275 - 295 mOsm/kg  Protime-INR     Status: None   Collection Time: 12/14/14  7:10 AM  Result Value Ref Range   Prothrombin Time 12.6 11.4 - 15.0 seconds   INR 0.92   Glucose, capillary     Status: Abnormal   Collection Time: 12/14/14  9:40 AM  Result Value Ref Range   Glucose-Capillary 453 (H) 65 - 99 mg/dL  MRSA PCR Screening     Status: None   Collection Time:  12/14/14 10:18 AM  Result Value Ref Range   MRSA by PCR NEGATIVE NEGATIVE    Comment:        The GeneXpert MRSA Assay (FDA approved for NASAL specimens only), is one component of a comprehensive MRSA colonization surveillance program. It is not intended to diagnose MRSA infection nor to guide or monitor treatment for MRSA infections.   Basic metabolic panel     Status: Abnormal   Collection Time: 12/14/14 10:19 AM  Result Value Ref Range   Sodium 129 (L) 135 - 145 mmol/L   Potassium 4.7 3.5 - 5.1 mmol/L   Chloride 91 (L) 101 - 111 mmol/L   CO2 29 22 - 32 mmol/L   Glucose, Bld 420 (H) 65 - 99 mg/dL   BUN 24 (H) 6 - 20 mg/dL   Creatinine, Ser 0.82 0.61 - 1.24 mg/dL   Calcium 8.8 (L) 8.9 - 10.3 mg/dL   GFR calc non Af Amer >60 >60 mL/min   GFR calc Af Amer >60 >60 mL/min    Comment: (NOTE) The eGFR has been calculated using the CKD EPI equation. This calculation has not been validated in all clinical situations. eGFR's persistently <60 mL/min signify possible Chronic Kidney Disease.    Anion gap 9 5 - 15  Ammonia     Status: None   Collection Time: 12/14/14 10:27 AM  Result Value Ref Range   Ammonia 9 9 - 35 umol/L  Glucose, capillary     Status: Abnormal   Collection Time: 12/14/14 11:14 AM  Result Value Ref Range   Glucose-Capillary 313 (H) 65 - 99 mg/dL  Glucose, capillary     Status: Abnormal   Collection Time: 12/14/14 12:18 PM  Result Value Ref Range   Glucose-Capillary 181 (H) 65 - 99 mg/dL    Vitals: Blood pressure 135/93, pulse 81, temperature 98.3 F (36.8 C), temperature source Oral, resp. rate 16, height 5' 4"  (1.626 m), weight 40.824 kg (90 lb), SpO2 100 %.  Risk to Self: Is patient at risk for suicide?: No Risk to Others:   Prior Inpatient Therapy:   Prior Outpatient Therapy:    Current Facility-Administered Medications  Medication Dose Route Frequency Provider Last Rate Last Dose  . 0.9 %  sodium chloride infusion   Intravenous Continuous Theodoro Grist, MD   Stopped at 12/14/14 1249  . calamine lotion   Topical PRN Theodoro Grist, MD      . clobetasol cream (TEMOVATE) 0.05 %   Topical BID Theodoro Grist, MD      . dextrose 5 %-0.45 % sodium chloride infusion   Intravenous Continuous Theodoro Grist, MD 100 mL/hr at 12/14/14 1249    . dextrose 50 % solution 25 mL  25 mL Intravenous PRN Theodoro Grist, MD      . diphenhydrAMINE (BENADRYL) capsule 25 mg  25 mg Oral Q4H PRN Theodoro Grist, MD      . [  START ON 12/15/2014] enoxaparin (LOVENOX) injection 30 mg  30 mg Subcutaneous Q24H Theodoro Grist, MD      . insulin regular (NOVOLIN R,HUMULIN R) 250 Units in sodium chloride 0.9 % 250 mL (1 Units/mL) infusion   Intravenous Continuous Theodoro Grist, MD 2.5 mL/hr at 12/14/14 1125 2.5 Units/hr at 12/14/14 1125  . insulin regular bolus via infusion 0-10 Units  0-10 Units Intravenous TID WC Theodoro Grist, MD   0 Units at 12/14/14 1200  . nicotine (NICODERM CQ - dosed in mg/24 hours) patch 14 mg  14 mg Transdermal Daily Theodoro Grist, MD   14 mg at 12/14/14 1147  . nicotine (NICOTROL) 10 MG inhaler 1 continuous puffing  1 continuous puffing Inhalation PRN Theodoro Grist, MD      . sodium chloride 0.9 % injection 3 mL  3 mL Intravenous Q12H Theodoro Grist, MD   3 mL at 12/14/14 1045    Musculoskeletal: Strength & Muscle Tone: flaccid and decreased Gait & Station: not assessed due to pt's condition Patient leans: N/A  Psychiatric Specialty Exam: Physical Exam  ROS  Blood pressure 135/93, pulse 81, temperature 98.3 F (36.8 C), temperature source Oral, resp. rate 16, height 5' 4"  (1.626 m), weight 40.824 kg (90 lb), SpO2 100 %.Body mass index is 15.44 kg/(m^2).  General Appearance: resting confortably in bed with a headband around his ears.   Eye Contact::  Good  Speech:  Clear and Coherent and Normal Rate, vulgar at times  Volume:  Normal  Mood:  Euthymic, irritable at times  Affect:  Full Range  Thought Process:  Coherent, Goal Directed, Intact,  Linear and Logical  Orientation:  Full (Time, Place, and Person)  Thought Content:  questionable delusions  Suicidal Thoughts:  No  Homicidal Thoughts:  No  Memory:  Immediate;   Good Recent;   Good Remote;   Good  Judgement:  Fair  Insight:  Fair  Psychomotor Activity:  Normal  Concentration:  Good  Recall:  Good  Fund of Knowledge:Good  Language: Good  Akathisia:  Negative  Handed:  Right  AIMS (if indicated):     Assets:  Communication Skills Desire for Improvement Financial Resources/Insurance Housing Leisure Time Resilience Social Support Talents/Skills   ADL's:  Intact  Cognition: WNL  Sleep:  Stable & adequate   Medical Decision Making: Established Problem, Stable/Improving (1), Review of Psycho-Social Stressors (1), Review or order clinical lab tests (1), Review or order medicine tests (1) and Review of Medication Regimen & Side Effects (2)  Treatment Plan Summary: Plan No psychiatric admission necessary at this time.   1. Continue to correct underlying medical conditions.  2. Will not recommended psychotropic medications at this time.  3. [facility] Psychiatry will follow.  4. Please check the following labs: TSH, urine drug screen and a heavy metal screen.  5. Will need to rule out delusional parasitosis and anxiety disorders.   Plan:  No evidence of imminent risk to self or others at present.   Patient does not meet criteria for psychiatric inpatient admission. Supportive therapy provided about ongoing stressors. Discussed crisis plan, support from social network, calling 911, coming to the Emergency Department, and calling Suicide Hotline.  Disposition: Home when medically stable.   Donita Brooks 12/14/2014 1:17 PM

## 2014-12-14 NOTE — Progress Notes (Signed)
Weaned from Insulin pump.  FSBS stable.  Pt transferred to Rm 117. Pt informed of impending transfer. Report called to [facility].  Pt left floor with personal belongings as well as medications.

## 2014-12-14 NOTE — ED Notes (Signed)
Lab called with critical value, Glucose 745; Dr Marcelene Butte notified.

## 2014-12-14 NOTE — ED Provider Notes (Signed)
[facility] Emergency Department Provider Note  ____________________________________________  Time seen: Approximately 621 AM  I have reviewed the triage vital signs and the nursing notes.   HISTORY  Chief Complaint Rash    HPI Stephen [name]. is a 69 y.o. male who was brought in by his friend for weight loss and rash on entire body. The patient reports in the last 3-4 days he's developed a rash this pruritic and painful on his arms legs and abdomen. The patient reports he also has some peeling on his hands which has been going on for 4-5 years. The patient denies taking anything for the rashes on his body. When asked if the patient had any recent contacts of food he reports that he hasn't eaten much of anything is only drank Ensure. The patient reports that he's also had a decreased appetite. He reports that he typically wears medical gloves and he puts onions in them to help draw out any parasite or infection. The patient reports that he thinks something is laying eggs in his hands. The patient denies any fever denies any chest pain denies any shortness of breath or abdominal pain. He also denies any vomiting or frequent urination. The patient has a friend who is visiting who was concerned as the patient has lost a significant amount of weight and does not wake much. He wanted the patient evaluated by a physician to determine what may be going on causing his symptoms.   Past Medical History  Diagnosis Date  . Diabetes mellitus without complication   . Psoriasis     Patient Active Problem List   Diagnosis Date Noted  . Diabetes mellitus type 2, uncontrolled 09/18/2014  . Psoriasis 09/16/2014  . Excessive weight loss 09/16/2014    Past Surgical History  Procedure Laterality Date  . Colonoscopy  1995    one polyp    Current Outpatient Rx  Name  Route  Sig  Dispense  Refill  . cephALEXin (KEFLEX) 500 MG capsule   Oral   Take 1 capsule (500 mg total)  by mouth 4 (four) times daily.   40 capsule   2   . clobetasol ointment (TEMOVATE) 0.05 %      APPLY TO AFFECTED AREA 2 (TWO) TIMES DAILY.   30 g   0   . Insulin Glargine (LANTUS SOLOSTAR) 100 UNIT/ML Solostar Pen   Subcutaneous   Inject 5 Units into the skin 2 (two) times daily.   5 pen   PRN   . Insulin Pen Needle (PEN NEEDLES 3/16") 31G X 5 MM MISC   Does not apply   1 each by Does not apply route 2 (two) times daily.   100 each   3   . metFORMIN (GLUCOPHAGE) 500 MG tablet   Oral   Take 1 tablet (500 mg total) by mouth 2 (two) times daily with a meal.   180 tablet   1     Allergies Review of patient's allergies indicates no known allergies.  Family History  Problem Relation Age of Onset  . Migraines Father     Social History Social History  Substance Use Topics  . Smoking status: Current Every Day Smoker  . Smokeless tobacco: Not on file     Comment: patient not ready to quit  . Alcohol Use: 0.0 oz/week    0 Standard drinks or equivalent per week     Comment: socially    Review of Systems Constitutional: Weight loss No fever/chills  Eyes: No visual changes. ENT: No sore throat. Cardiovascular: Denies chest pain. Respiratory: Denies shortness of breath. Gastrointestinal: No abdominal pain.  No nausea,  Genitourinary: Negative for dysuria. Musculoskeletal: Negative for back pain. Skin: Rash on body Neurological: Negative for headaches, focal weakness or numbness.  10-point ROS otherwise negative.  ____________________________________________   PHYSICAL EXAM:  VITAL SIGNS: ED Triage Vitals  Enc Vitals Group     BP 12/14/14 0552 118/74 mmHg     Pulse Rate 12/14/14 0552 105     Resp 12/14/14 0552 18     Temp 12/14/14 0552 98.1 F (36.7 C)     Temp Source 12/14/14 0552 Oral     SpO2 12/14/14 0552 98 %     Weight 12/14/14 0552 90 lb (40.824 kg)     Height 12/14/14 0552 5\' 4"  (1.626 m)     Head Cir --      Peak Flow --      Pain Score  12/14/14 0553 6     Pain Loc --      Pain Edu? --      Excl. in [place]? --     Constitutional: Alert and oriented. Well appearing and in no acute distress. Eyes: Conjunctivae are normal. PERRL. EOMI. Head: Atraumatic. Nose: No congestion/rhinnorhea. Mouth/Throat: Mucous membranes are moist.  Oropharynx non-erythematous. Cardiovascular: Normal rate, regular rhythm. Grossly normal heart sounds.  Good peripheral circulation. Respiratory: Normal respiratory effort.  No retractions. Lungs CTAB. Gastrointestinal: Soft and nontender. No distention.  Musculoskeletal: No lower extremity tenderness nor edema.   Neurologic:  Normal speech and language.  Skin:  Raised hived on bilateral arms, legs and abdomen Psychiatric: Mood and affect are normal.   ____________________________________________   LABS (all labs ordered are listed, but only abnormal results are displayed)  Labs Reviewed  CBC - Abnormal; Notable for the following:    Platelets 124 (*)    All other components within normal limits  URINALYSIS COMPLETEWITH MICROSCOPIC ([facility] ONLY) - Abnormal; Notable for the following:    Color, Urine STRAW (*)    APPearance CLEAR (*)    Glucose, UA >500 (*)    All other components within normal limits  COMPREHENSIVE METABOLIC PANEL  TROPONIN I  MAGNESIUM  PHOSPHORUS   ____________________________________________  EKG  None ____________________________________________  RADIOLOGY  None ____________________________________________   PROCEDURES  Procedure(s) performed: None  Critical Care performed: No  ____________________________________________   INITIAL IMPRESSION / ASSESSMENT AND PLAN / ED COURSE  Pertinent labs & imaging results that were available during my care of the patient were reviewed by me and considered in my medical decision making (see chart for details).  This is a 68 year old male who comes in with weight loss especially 30 pounds in the last few months,  itchy rash all over his body. The patient has not been eating much and has some dark skin on his bilateral hands. I will evaluate the patient for Y abnormality and reassess the patient once I received the blood work.  The patient's care was signed out to Dr. Marcelene Butte who will follow-up the results of the patient's blood work. Looking at the patient's hands there is a possibility that he may have some adrenal disease given the discoloration and the thickening of the patient's skin. 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 The patient is to be on clobetasol steroid cream which she reports has not been working. ____________________________________________   FINAL CLINICAL IMPRESSION(S) /  ED DIAGNOSES  Final diagnoses:  Hives      Loney Hering, MD 12/14/14 [phone]

## 2014-12-14 NOTE — ED Notes (Signed)
Pt states rash off and on for four years. Pt states rash has been worse for 4 days. Pt with open skin noted to bilateral hands. Pt states rash is present on bilateral hands, forearms and feet. Pt denies fever, has been using "onions" and calamine lotion for rash.

## 2014-12-15 ENCOUNTER — Encounter: Payer: Self-pay | Admitting: Gastroenterology

## 2014-12-15 DIAGNOSIS — E871 Hypo-osmolality and hyponatremia: Secondary | ICD-10-CM

## 2014-12-15 DIAGNOSIS — K3189 Other diseases of stomach and duodenum: Secondary | ICD-10-CM

## 2014-12-15 DIAGNOSIS — K8020 Calculus of gallbladder without cholecystitis without obstruction: Secondary | ICD-10-CM

## 2014-12-15 DIAGNOSIS — E46 Unspecified protein-calorie malnutrition: Secondary | ICD-10-CM

## 2014-12-15 DIAGNOSIS — Z720 Tobacco use: Secondary | ICD-10-CM

## 2014-12-15 DIAGNOSIS — D696 Thrombocytopenia, unspecified: Secondary | ICD-10-CM

## 2014-12-15 DIAGNOSIS — F1010 Alcohol abuse, uncomplicated: Secondary | ICD-10-CM

## 2014-12-15 DIAGNOSIS — R21 Rash and other nonspecific skin eruption: Secondary | ICD-10-CM

## 2014-12-15 LAB — GLUCOSE, CAPILLARY
GLUCOSE-CAPILLARY: 65 mg/dL (ref 65–99)
GLUCOSE-CAPILLARY: 169 mg/dL — AB (ref 65–99)
GLUCOSE-CAPILLARY: 289 mg/dL — AB (ref 65–99)

## 2014-12-15 LAB — COMPREHENSIVE METABOLIC PANEL
ALBUMIN: 3.1 g/dL — AB (ref 3.5–5.0)
ALK PHOS: 84 U/L (ref 38–126)
ALT: 19 U/L (ref 17–63)
ANION GAP: 8 (ref 5–15)
AST: 41 U/L (ref 15–41)
BILIRUBIN TOTAL: 1 mg/dL (ref 0.3–1.2)
BUN: 13 mg/dL (ref 6–20)
CALCIUM: 8.2 mg/dL — AB (ref 8.9–10.3)
CO2: 26 mmol/L (ref 22–32)
CREATININE: 0.75 mg/dL (ref 0.61–1.24)
Chloride: 98 mmol/L — ABNORMAL LOW (ref 101–111)
GFR calc Af Amer: >60 mL/min (ref >60)
GFR calc non Af Amer: >60 mL/min (ref >60)
GLUCOSE: 207 mg/dL — AB (ref 65–99)
Potassium: 4.1 mmol/L (ref 3.5–5.1)
Sodium: 132 mmol/L — ABNORMAL LOW (ref 135–145)
TOTAL PROTEIN: 5.6 g/dL — AB (ref 6.5–8.1)

## 2014-12-15 LAB — CBC
HCT: 40.1 % (ref 40.0–52.0)
HEMOGLOBIN: 13.7 g/dL (ref 13.0–18.0)
MCH: 32 pg (ref 26.0–34.0)
MCHC: 34.2 g/dL (ref 32.0–36.0)
MCV: 93.7 fL (ref 80.0–100.0)
PLATELETS: 112 10*3/uL — AB (ref 150–440)
RBC: 4.28 MIL/uL — ABNORMAL LOW (ref 4.40–5.90)
RDW: 13 % (ref 11.5–14.5)
WBC: 7.8 10*3/uL (ref 3.8–10.6)

## 2014-12-15 LAB — TSH: TSH: 1.437 u[IU]/mL (ref 0.350–4.500)

## 2014-12-15 MED ORDER — NICOTINE 10 MG IN INHA: 1.0000 | RESPIRATORY_TRACT | Status: DC | PRN

## 2014-12-15 MED ORDER — CALAMINE EX LOTN: TOPICAL_LOTION | CUTANEOUS | Status: DC | PRN

## 2014-12-15 MED ORDER — INSULIN ASPART 100 UNIT/ML ~~LOC~~ SOLN: 0.0000 [IU] | Freq: Three times a day (TID) | SUBCUTANEOUS | Status: DC

## 2014-12-15 MED ORDER — INSULIN ASPART 100 UNIT/ML ~~LOC~~ SOLN: 0.0000 [IU] | Freq: Every day | SUBCUTANEOUS | Status: DC

## 2014-12-15 MED ORDER — SODIUM CHLORIDE 0.9 % IJ SOLN
INTRAMUSCULAR | Status: AC
  Filled 2014-12-15: qty 10

## 2014-12-15 MED ORDER — DIPHENHYDRAMINE HCL 25 MG PO CAPS: 25.0000 mg | ORAL_CAPSULE | ORAL | Status: DC | PRN

## 2014-12-15 MED ORDER — INSULIN GLARGINE 100 UNIT/ML ~~LOC~~ SOLN
8.0000 [IU] | Freq: Every day | SUBCUTANEOUS | Status: DC
  Administered 2014-12-15: 09:00:00 8 [IU] via SUBCUTANEOUS
  Filled 2014-12-15 (×2): qty 0.08

## 2014-12-15 MED ORDER — NICOTINE 14 MG/24HR TD PT24: 14.0000 mg | MEDICATED_PATCH | Freq: Every day | TRANSDERMAL | Status: DC

## 2014-12-15 MED ORDER — CLOBETASOL PROPIONATE 0.05 % EX CREA
TOPICAL_CREAM | Freq: Two times a day (BID) | CUTANEOUS | Status: DC
Start: 2014-12-15 — End: 2015-05-01

## 2014-12-15 MED ORDER — INSULIN GLARGINE 100 UNIT/ML ~~LOC~~ SOLN: 8.0000 [IU] | Freq: Every day | SUBCUTANEOUS | Status: DC

## 2014-12-15 NOTE — Progress Notes (Signed)
Home Health RN to monitor general health and insulin administration, and to provide Diabetic Disease Process teaching. V.O. Dr Darryl Nestle, RN, BSN, CM on 12/15/14 @ 12noon.

## 2014-12-15 NOTE — Progress Notes (Signed)
Pt being discharged home today, pt eduacted on importance of taking insulin as prescribed, also had pt demonstrate ability to draw up his own insulin and inject himself, pt demonstrated correct method to draw up and inject insulin, pt also given some inulin syringes to take home

## 2014-12-15 NOTE — Care Management Note (Signed)
Case Management Note  Patient Details  Name: Stephen [name]. MRN: [id number] Date of Birth: 1945/05/24  Subjective/Objective:     Discussed home health with Dr Ether Griffins who gave this writer a verbal order for home health RN to follow up with [name] at home with managing his insulin and other meds. [name] has been using onion juice on his feet and hands to kill parasites. [name] only ingests Ensure and has lost weight recently. [name] is not a new insulin patient but he reports that he only uses insulin "when I feel like it." Mr Mustard was unable to recall whether or not he has any insulin syringes at home but states that he does have insulin.               Action/Plan:   Expected Discharge Date:                  Expected Discharge Plan:     In-House Referral:     Discharge planning Services     Post Acute Care Choice:    Choice offered to:     DME Arranged:    DME Agency:     HH Arranged:    [place] Agency:     Status of Service:     Medicare Important Message Given:    Date Medicare IM Given:    Medicare IM give by:    Date Additional Medicare IM Given:    Additional Medicare Important Message give by:     If discussed at [place] of Stay Meetings, dates discussed:    Additional Comments:  Mazi Schuff A, RN 12/15/2014, 5:11 PM

## 2014-12-15 NOTE — Consult Note (Signed)
GI Inpatient Consult Note  Reason for Consult:   Attending Requesting Consult:  History of Present Illness: Stephen [name]. is a 69 y.o. male with abnormal CT of stomach with possible infiltrative mass. Initially admitted with wt loss, possible skin rash, and BG out of control. Pt is thin. Lost significant wt but denies any abdominal pain, nausea, or loss of appetite. Is edentulous, so not able to chew foods well. Sometimes gets full easily. No heartburn either.  Past Medical History:  Past Medical History  Diagnosis Date  . Diabetes mellitus without complication   . Psoriasis     Problem List: Patient Active Problem List   Diagnosis Date Noted  . Rash and nonspecific skin eruption 12/15/2014  . Hyponatremia 12/15/2014  . Thrombocytopenia 12/15/2014  . 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 Malnutrition 12/15/2014  . Alcohol abuse 12/15/2014  . Tobacco abuse 12/15/2014  . Cholelithiasis 12/15/2014  . Hyperosmolar non-ketotic state in patient with type 2 diabetes mellitus 12/14/2014  . Rash 12/14/2014  . Delusional disorder, unspecified type 12/14/2014  . Diabetes mellitus type 2, uncontrolled 09/18/2014  . Psoriasis 09/16/2014  . Excessive weight loss 09/16/2014    Past Surgical History: Past Surgical History  Procedure Laterality Date  . Colonoscopy  1995    one polyp    Allergies: No Known Allergies  Home Medications: Prescriptions prior to admission  Medication Sig Dispense Refill Last Dose  . cephALEXin (KEFLEX) 500 MG capsule Take 1 capsule (500 mg total) by mouth 4 (four) times daily. 40 capsule 2 12/11/2014 at Unknown time  . clobetasol ointment (TEMOVATE) 0.05 % APPLY TO AFFECTED AREA 2 (TWO) TIMES DAILY. 30 g 0 12/14/2014 at Unknown time  . metFORMIN (GLUCOPHAGE) 500 MG tablet Take 1 tablet (500 mg total) by mouth 2 (two) times daily with a meal. 180 tablet 1 12/07/2014   Home medication reconciliation was completed with the patient.   Scheduled Inpatient  Medications:   . clobetasol cream   Topical BID  . enoxaparin (LOVENOX) injection  30 mg Subcutaneous Q24H  . insulin aspart  0-5 Units Subcutaneous QHS  . insulin aspart  0-9 Units Subcutaneous TID WC  . insulin glargine  8 Units Subcutaneous Daily  . nicotine  14 mg Transdermal Daily  . sodium chloride  3 mL Intravenous Q12H  . sodium chloride        Continuous Inpatient Infusions:   . sodium chloride Stopped (12/15/14 1006)    PRN Inpatient Medications:  calamine, diphenhydrAMINE, nicotine  Family History: family history includes Migraines in his father.  The patient's family history is negative for inflammatory bowel disorders, GI malignancy, or solid organ transplantation.  Social History:   reports that he has been smoking.  He does not have any smokeless tobacco history on file. He reports that he drinks alcohol. He reports that he uses illicit drugs about twice per week. The patient denies ETOH, tobacco, or drug use.   Review of Systems: Constitutional: Weight loss..  Eyes: No changes in vision. ENT: No oral lesions, sore throat.  GI: see HPI.  Heme/Lymph: No easy bruising.  CV: No chest pain.  GU: No hematuria.  Integumentary: No rashes.  Neuro: No headaches.  Psych: No depression/anxiety.  Endocrine: No heat/cold intolerance.  Allergic/Immunologic: No urticaria.  Resp: No cough, SOB.  Musculoskeletal: No joint swelling.    Physical Examination: BP 123/68 mmHg  Pulse 91  Temp(Src) 98.6 F (37 C) (Oral)  Resp 18  Ht 5\' 9"  (1.753 m)  Wt 48.036  kg (105 lb 14.4 oz)  BMI 15.63 kg/m2  SpO2 100% Gen: NAD, alert and oriented x 4 but fairly thin.  HEENT: PEERLA, EOMI, Neck: supple, no JVD or thyromegaly Chest: CTA bilaterally, no wheezes, crackles, or other adventitious sounds CV: RRR, no m/g/c/r Abd: soft, NT, ND, +BS in all four quadrants; no HSM, guarding, ridigity, or rebound tenderness Ext: no edema, well perfused with 2+ pulses, Skin: no rash or  lesions noted Lymph: no LAD  Data: Lab Results  Component Value Date   WBC 7.8 12/15/2014   HGB 13.7 12/15/2014   HCT 40.1 12/15/2014   MCV 93.7 12/15/2014   PLT 112* 12/15/2014    Recent Labs Lab 12/14/14 0710 12/15/14 0607  HGB 15.8 13.7   Lab Results  Component Value Date   NA 132* 12/15/2014   K 4.1 12/15/2014   CL 98* 12/15/2014   CO2 26 12/15/2014   BUN 13 12/15/2014   CREATININE 0.75 12/15/2014   Lab Results  Component Value Date   ALT 19 12/15/2014   AST 41 12/15/2014   ALKPHOS 84 12/15/2014   BILITOT 1.0 12/15/2014    Recent Labs Lab 12/14/14 0710  INR 0.92   Assessment/Plan: [name] is a 69 y.o. male with wt loss and abnormal CT of stomach.   Recommendations: Discussed with pt about scheduling EGD to evaluate stomach. Pt adamant about not getting EGD now. Does not care if daughter insists about pt doing tests at this point. States daughter does not power of attorney and that he is the one to make any decisions for himself.  If stomach starts to bother him, then pt may contact our office later for further evaluation. Will sign off.  Thank you for the consult. Please call with questions or concerns.  Janeene Sand, Lupita Dawn, MD

## 2014-12-15 NOTE — Progress Notes (Signed)
Clinical Education officer, museum (CSW) received medication assistance consult. RN Case Manager aware of above. Please reconsult if future social work needs arise. CSW signing off.   Blima Rich, [place] [phone]

## 2014-12-15 NOTE — Progress Notes (Signed)
MD order received to discharge pt home today; verbally reviewed AVS with pt, no further questions voiced at this time; pt's discharge pending Security personnel delivering his valuables locked in the hospital safe and his ride arriving for discharge

## 2014-12-15 NOTE — Progress Notes (Signed)
Pt being discharged home at this time, personal belonging returned to pt from safe, discharge reviewed with pt by R.T RN, pt with no noted complaints at discharge, refused wheelchair, ambulated to exit

## 2014-12-15 NOTE — Progress Notes (Signed)
PT Cancellation Note  Patient Details Name: Stephen [name]. MRN: [id number] DOB: 09-08-1945   Cancelled Treatment:     Chart reviewed and RN consulted. RN reports that pt is discharging home today and does not need a PT consult. RN reports that pt is safe and steady with ambulation in room and PT order can be completed. PT order will be completed at this time. Please enter new order if status changes.  Lyndel Safe Huprich PT, DPT   Huprich,Jason 12/15/2014, 10:47 AM

## 2014-12-15 NOTE — Care Management Note (Addendum)
Case Management Note  Patient Details  Name: Stephen [name]. MRN: [id number] Date of Birth: Jul 14, 1945  Subjective/Objective:  Attempted to discuss discharge planning with Stephen [name] who is insisting that he is going home today. His daughter is in his room crying and saying that her father will not follow up with anything after he leaves the hospital. Stephen [name] is unable to identify the type/name of the insulin that he reports that he has at home. When asked if he takes his insulin daily he responded "I take it once in [name] while when I feel like it." When asked if he has any insulin syringes at home Stephen [name] responded "I don't know what I have."  Stephen [name] has Medicare medical insurance. This Probation officer is calling Dr Ether [name] to discuss discharge planning and possible home health follow-up. Requested to Stephen [name] that he be provided with insulin syringes and that he demonstrate to her that he can draw up the prescribed amount of insulin into [name] syringe as demonstrated by drawing up saline into [name] syringe in the same amounts that he would draw up insulin at home.               Action/Plan:   Expected Discharge Date:                  Expected Discharge Plan:     In-House Referral:     Discharge planning Services     Post Acute Care Choice:    Choice offered to:     DME Arranged:    DME [name]:     HH Arranged:    [name]:     Status of Service:     Medicare Important Message Given:    Date Medicare IM Given:    Medicare IM give by:    Date Additional Medicare IM Given:    Additional Medicare Important Message give by:     If discussed at [place] of Stay Meetings, dates discussed:    Additional Comments:  Stephen Litaker A, RN 12/15/2014, 9:39 AM

## 2014-12-15 NOTE — Discharge Summary (Signed)
[facility] NAME: Stephen [name]    MR#:  [id number]  DATE OF BIRTH:  07-29-1945  DATE OF ADMISSION:  12/14/2014 ADMITTING PHYSICIAN: Theodoro Grist, MD  DATE OF DISCHARGE: No discharge date for patient encounter.  PRIMARY CARE PHYSICIAN: [name], MD     ADMISSION DIAGNOSIS:  Hives [L50.9] Weight loss [R63.4] Hyperglycemia [R73.9]  DISCHARGE DIAGNOSIS:  Principal Problem:   Delusional disorder, unspecified type Active Problems:   Hyperosmolar non-ketotic state in patient with type 2 diabetes mellitus   Rash   Rash and nonspecific skin eruption   Hyponatremia   Thrombocytopenia   Gastric mass   Malnutrition   Alcohol abuse   Tobacco abuse   Cholelithiasis   SECONDARY DIAGNOSIS:   Past Medical History  Diagnosis Date  . Diabetes mellitus without complication   . Psoriasis     .pro HOSPITAL COURSE:    Stephen [name] is 69 year old male with history of diabetes, psoriasis who presents to the hospital with severe weight loss,  Rash in upper extremities.  On arrival to emergency room, his blood glucose level was found to be around 700 and he was admitted for hyperosmolar state.  Patient's hemoglobin A1c was checked was found to be 17.0.  Patient was initiated on insulin IV drip which was transitioned to insulin Lantus.  Because of his weight loss he underwent CT scan of his abdomen and pelvis which revealed  masslike thickening throughout the gastric wall in the region of the body of stomach, concerning for possible infiltrative gastric neoplasm further evaluation with EGD was recommended to exclude neoplasm.  There was also noted sequelae of chronic pancreatitis and cholelithiasis without evidence of acute cholecystitis.  Gastroenterology consultation was obtained,  , but patient was not interested in any interventional evaluation   despite  please from physician as well as family members.  Patient was  evaluated by psychiatrist, Dr. Tami Ribas,  , who felt that patient has delusional disorder,  By did not recommend  Psychotropic medications or psychiatric admission,  However, he recommended  To get urine drug screen as well as heavy metal screen and TSH.  TSH was normal at 1.437.  Urine drug screen as well as heavy metal screens are pending.  Patient requested to be discharged home.     Discussion by problem  1.  Hyperosmolar nonketotic state in patient with diabetes mellitus type 2  With hemoglobin A1c 17.0,  Patient was initially admitted to intensive care unit for insulin IV drip.  Then transition to  Insulin Lantus.  Unfortunately, unable to get diabetic education due to being weekend /holidays.  I discussed patient's case was  Patient's daughter and recommended [facility] as outpatient.  Patient is to continue low-dose of insulin Lantus and sliding scale short-acting insulin 2. 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 Continue patient on steroids topically,  Benadryl and  calamine lotion,  Patient's condition improved and he is ready to  go home.  3.  Delusional  disorder, per psychiatrist, questionable  Underlying  psychiatric disease,  appreciate psychiatrist  Input,  Getting urine drug screen as well as heavy metal screen TSH was normal 4. Hyponatremia.  Improved with IV fluid administration, likely dehydration related,  Follow-up as outpatient.  TSH was normal.  May need to rule out SIADH.  5. Thrombocytopenia,  likely alcohol abuse related,  Follow-up as outpatient,  Some worse today as compared to yesterday or 3 months ago 6. Alcohol  abuse.  No signs of withdrawal.  7. Tobacco abuse. Discussed this patient for approximately 4 minutes  On admission. Nicotine replacement therapy  To be continued 8.  Gastric mass of unclear etiology, rule out a cancerous condition. Gastroenterology consultation is appreciated, although patient is reluctant to  Proceed to. Evaluation,  We will  recommend outpatient follow-up with Dr. Candace Cruise 9.  Malnutrition, likely due to poorly controlled diabetes mellitus. However, cannot rule out nutritional deficiency due to gastric mass,   The case was discussed this patient's daughter,  All questions answered, she voiced understanding.  Patient is competent to make decisions for his own medical care,  Although would benefit from her support visiting [facility] for diabetes management.  He would also benefit from follow-up with Dr. Candace Cruise,  If  agreeable  DISCHARGE CONDITIONS:    stable  CONSULTS OBTAINED:     DRUG ALLERGIES:  No Known Allergies  DISCHARGE MEDICATIONS:   Current Discharge Medication List    START taking these medications   Details  calamine lotion Apply topically as needed for itching. Qty: 120 mL, Refills: 0    clobetasol cream (TEMOVATE) 0.05 % Apply topically 2 (two) times daily. Qty: 30 g, Refills: 0    diphenhydrAMINE (BENADRYL) 25 mg capsule Take 1 capsule (25 mg total) by mouth every 4 (four) hours as needed for itching. Qty: 30 capsule, Refills: 0    !! insulin aspart (NOVOLOG) 100 UNIT/ML injection Inject 0-5 Units into the skin at bedtime. Qty: 10 mL, Refills: 11    !! insulin aspart (NOVOLOG) 100 UNIT/ML injection Inject 0-9 Units into the skin 3 (three) times daily with meals. Qty: 10 mL, Refills: 11    insulin glargine (LANTUS) 100 UNIT/ML injection Inject 0.08 mLs (8 Units total) into the skin daily. Qty: 10 mL, Refills: 11    nicotine (NICODERM CQ - DOSED IN MG/24 HOURS) 14 mg/24hr patch Place 1 patch (14 mg total) onto the skin daily. Qty: 28 patch, Refills: 0    nicotine (NICOTROL) 10 MG inhaler Inhale 1 cartridge (1 continuous puffing total) into the lungs as needed for smoking cessation. Qty: 42 each, Refills: 0     !! - Potential duplicate medications found. Please discuss with provider.    CONTINUE these medications which have NOT CHANGED   Details  clobetasol ointment (TEMOVATE) 0.05 %  APPLY TO AFFECTED AREA 2 (TWO) TIMES DAILY. Qty: 30 g, Refills: 0      STOP taking these medications     cephALEXin (KEFLEX) 500 MG capsule      metFORMIN (GLUCOPHAGE) 500 MG tablet          DISCHARGE INSTRUCTIONS:     patient is to follow-up with Dr. Army Melia in 2-3 days after discharge,  [facility] and Dr. Candace Cruise  If you experience worsening of your admission symptoms, develop shortness of breath, life threatening emergency, suicidal or homicidal thoughts you must seek medical attention immediately by calling 911 or calling your MD immediately  if symptoms less severe.  You Must read complete instructions/literature along with all the possible adverse reactions/side effects for all the Medicines you take and that have been prescribed to you. Take any new Medicines after you have completely understood and accept all the possible adverse reactions/side effects.   Please note  You were cared for by a hospitalist during your hospital stay. If you have any questions about your discharge medications or the care you received while you were in the hospital after you are discharged,  you can call the unit and asked to speak with the hospitalist on call if the hospitalist that took care of you is not available. Once you are discharged, your primary care physician will handle any further medical issues. Please note that NO REFILLS for any discharge medications will be authorized once you are discharged, as it is imperative that you return to your primary care physician (or establish a relationship with a primary care physician if you do not have one) for your aftercare needs so that they can reassess your need for medications and monitor your lab values.    Today   CHIEF COMPLAINT:   Chief Complaint  Patient presents with  . Rash    HISTORY OF PRESENT ILLNESS:  Stephen [name]  is a 69 y.o. male with a known history of diabetes, psoriasis who presents to the hospital with severe weight  loss,  Rash in upper extremities.  On arrival to emergency room, his blood glucose level was found to be around 700 and he was admitted for hyperosmolar state.  Patient's hemoglobin A1c was checked was found to be 17.0.  Patient was initiated on insulin IV drip which was transitioned to insulin Lantus.  Because of his weight loss he underwent CT scan of his abdomen and pelvis which revealed  masslike thickening throughout the gastric wall in the region of the body of stomach, concerning for possible infiltrative gastric neoplasm further evaluation with EGD was recommended to exclude neoplasm.  There was also noted sequelae of chronic pancreatitis and cholelithiasis without evidence of acute cholecystitis.  Gastroenterology consultation was obtained,  , but patient was not interested in any interventional evaluation   despite  please from physician as well as family members.  Patient was evaluated by psychiatrist, Dr. Tami Ribas,  , who felt that patient has delusional disorder,  By did not recommend  Psychotropic medications or psychiatric admission,  However, he recommended  To get urine drug screen as well as heavy metal screen and TSH.  TSH was normal at 1.437.  Urine drug screen as well as heavy metal screens are pending.  Patient requested to be discharged home.     Discussion by problem  1.  Hyperosmolar nonketotic state in patient with diabetes mellitus type 2  With hemoglobin A1c 17.0,  Patient was initially admitted to intensive care unit for insulin IV drip.  Then transition to  Insulin Lantus.  Unfortunately, unable to get diabetic education due to being weekend /holidays.  I discussed patient's case was  Patient's daughter and recommended [facility] as outpatient.  Patient is to continue low-dose of insulin Lantus and sliding scale short-acting insulin 2. 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 Continue patient on steroids topically,  Benadryl and  calamine lotion,  Patient's  condition improved and he is ready to  go home.  3.  Delusional  disorder, per psychiatrist, questionable  Underlying  psychiatric disease,  appreciate psychiatrist  Input,  Getting urine drug screen as well as heavy metal screen TSH was normal 4. Hyponatremia.  Improved with IV fluid administration, likely dehydration related,  Follow-up as outpatient.  TSH was normal.  May need to rule out SIADH.  5. Thrombocytopenia,  likely alcohol abuse related,  Follow-up as outpatient,  Some worse today as compared to yesterday or 3 months ago 6. Alcohol abuse.  No signs of withdrawal.  7. Tobacco abuse. Discussed this patient for approximately 4 minutes  On admission. Nicotine replacement therapy  To be continued 8.  Gastric mass of unclear etiology, rule out a cancerous condition. Gastroenterology consultation is appreciated, although patient is reluctant to  Proceed to. Evaluation,  We will recommend outpatient follow-up with Dr. Candace Cruise 9.  Malnutrition, likely due to poorly controlled diabetes mellitus. However, cannot rule out nutritional deficiency due to gastric mass,   The case was discussed this patient's daughter,  All questions answered, she voiced understanding.  Patient is competent to make decisions for his own medical care,  Although would benefit from her support visiting [facility] for diabetes management.  He would also benefit from follow-up with Dr. Candace Cruise,  If   Patient is agreeable    VITAL SIGNS:  Blood pressure 123/68, pulse 91, temperature 98.6 F (37 C), temperature source Oral, resp. rate 18, height 5\' 9"  (1.753 m), weight 48.036 kg (105 lb 14.4 oz), SpO2 100 %.  I/O:   Intake/Output Summary (Last 24 hours) at 12/15/14 1234 Last data filed at 12/15/14 0800  Gross per 24 hour  Intake 1027.8 ml  Output   1300 ml  Net -272.2 ml    PHYSICAL EXAMINATION:  GENERAL:  69 y.o.-year-old patient lying in the bed with no acute distress.    Wasted,  Decreased muscle mass EYES: Pupils  equal, round, reactive to light and accommodation. No scleral icterus. Extraocular muscles intact.  HEENT: Head atraumatic, normocephalic. Oropharynx and nasopharynx clear.  NECK:  Supple, no jugular venous distention. No thyroid enlargement, no tenderness.  LUNGS: Normal breath sounds bilaterally, no wheezing, rales,rhonchi or crepitation. No use of accessory muscles of respiration.  CARDIOVASCULAR: S1, S2 normal. No murmurs, rubs, or gallops.  ABDOMEN: Soft, non-tender, non-distended. Bowel sounds present. No organomegaly or mass.  EXTREMITIES: No pedal edema, cyanosis, or clubbing.  NEUROLOGIC: Cranial nerves II through XII are intact. Muscle strength 5/5 in all extremities. Sensation intact. Gait not checked.  PSYCHIATRIC: The patient is alert and oriented x 3.  SKIN: No obvious rash, lesion, or ulcer.   DATA REVIEW:   CBC  Recent Labs Lab 12/15/14 0607  WBC 7.8  HGB 13.7  HCT 40.1  PLT 112*    Chemistries   Recent Labs Lab 12/14/14 0710  12/15/14 0607  NA 124*  < > 132*  K 5.0  < > 4.1  CL 84*  < > 98*  CO2 29  < > 26  GLUCOSE 745*  < > 207*  BUN 28*  < > 13  CREATININE 0.98  < > 0.75  CALCIUM 9.4  < > 8.2*  MG 2.3  --   --   AST 34  --  41  ALT 18  --  19  ALKPHOS 117  --  84  BILITOT 1.0  --  1.0  < > = values in this interval not displayed.  Cardiac Enzymes  Recent Labs Lab 12/14/14 0710  TROPONINI <0.03    Microbiology Results  Results for orders placed or performed during the hospital encounter of 12/14/14  MRSA PCR Screening     Status: None   Collection Time: 12/14/14 10:18 AM  Result Value Ref Range Status   MRSA by PCR NEGATIVE NEGATIVE Final    Comment:        The GeneXpert MRSA Assay (FDA approved for NASAL specimens only), is one component of a comprehensive MRSA colonization surveillance program. It is not intended to diagnose MRSA infection nor to guide or monitor treatment for MRSA infections.     RADIOLOGY:  Ct Abdomen  Pelvis Wo Contrast  12/14/2014  CLINICAL DATA:  69 year old male with rash on the upper extremities. Recent loss of weight and poor appetite.  EXAM: CT ABDOMEN AND PELVIS WITHOUT CONTRAST  TECHNIQUE: Multidetector CT imaging of the abdomen and pelvis was performed following the standard protocol without IV contrast.  COMPARISON:  No priors.  FINDINGS: Lower chest: Mild subsegmental atelectasis and/or scarring throughout the lung bases bilaterally.  Hepatobiliary: No definite cystic or solid hepatic lesions are identified on today's noncontrast CT examination. Small calcified gallstone lying dependently in the gallbladder. No current findings to suggest an acute cholecystitis at this time.  Pancreas: Pancreas is atrophic and densely calcified, likely sequela of chronic pancreatitis.  Spleen: Unremarkable.  Adrenals/Urinary Tract: Unenhanced appearance of the kidneys and bilateral adrenal glands is normal. No hydroureteronephrosis. The unenhanced appearance of the urinary bladder is normal.  Stomach/Bowel: The stomach is decompressed, and poorly evaluated on today's noncontrast CT examination. With these limitations in mind, however, the gastric wall appears diffusely thickened throughout the gastric body, best appreciated on image 16 of series 2. No pathologic dilatation of small bowel or colon.  Vascular/Lymphatic: Atherosclerotic calcifications are noted throughout the abdominal and pelvic vasculature, without definite aneurysm. Multiple borderline enlarged retroperitoneal lymph nodes measuring up to 9 mm in short axis (nonspecific). No definite lymphadenopathy noted elsewhere in the abdomen or pelvis on today's noncontrast CT examination.  Reproductive: The unenhanced appearance of the prostate gland and seminal vesicles is unremarkable.  Other: No significant volume of ascites.  No pneumoperitoneum.  Musculoskeletal: There are no aggressive appearing lytic or blastic lesions noted in the visualized portions of  the skeleton.  IMPRESSION: 1. Mass-like thickening throughout the gastric wall in the region of the body of the stomach, concerning for potential infiltrative gastric neoplasm. This is poorly evaluated on today's noncontrast CT examination, particularly given the under distention of the stomach. However, further evaluation with nonemergent endoscopy is recommended in the near future to exclude neoplasm. 2. Sequela of chronic pancreatitis, as above. 3. Cholelithiasis without evidence of acute cholecystitis at this time. 4. Extensive atherosclerosis.   Electronically Signed   By: Vinnie Langton M.D.   On: 12/14/2014 13:19    EKG:  No orders found for this or any previous visit.    Management plans discussed with the patient, family and they are in agreement.  CODE STATUS:     Code Status Orders        Start     Ordered   12/14/14 1036  Full code   Continuous     12/14/14 1035      TOTAL TIME TAKING CARE OF THIS PATIENT: 40 minutes.    Theodoro Grist M.D on 12/15/2014 at 12:34 PM  Between 7am to 6pm - Pager - [phone]  After 6pm go to [email or web address] - password [email or web address] [facility]  Office  [phone]  CC: Primary care physician; [name], MD

## 2014-12-15 NOTE — Plan of Care (Signed)
Problem: Discharge Progression Outcomes Goal: Other Discharge Outcomes/Goals Outcome: Progressing Patient plans to discharge home when able, no complaints of pain, VSS IVF infusing per md order, CBG checks as ordered. Not eating well, per report has lost 30 pounds in last 3 months.

## 2014-12-18 LAB — GLUCOSE, CAPILLARY: GLUCOSE-CAPILLARY: 389 mg/dL — AB (ref 65–99)

## 2014-12-18 LAB — HEAVY METALS, BLOOD
ARSENIC: 4 ug/L (ref 2–23)
Lead: NOT DETECTED ug/dL (ref 0–19)
MERCURY: 1.5 ug/L (ref 0.0–14.9)

## 2014-12-24 NOTE — Patient Outreach (Signed)
[facility]) Care Management  12/24/2014  Stephen [name]. 21-Feb-1946 [id number]   Referral from [facility], assigned to [name], NP and Harlow Asa, PharmD for patient outreach.  [name] L. Danne Vasek, [facility] Care Management Assistant

## 2014-12-27 ENCOUNTER — Other Ambulatory Visit: Payer: Self-pay | Admitting: *Deleted

## 2014-12-27 NOTE — Patient Outreach (Signed)
Attempted telephone outreach. Pt did not answer the phone. I left a brief message and requested a return call.  [name] [facility] [place] [phone]

## 2014-12-30 ENCOUNTER — Other Ambulatory Visit: Payer: Self-pay | Admitting: *Deleted

## 2014-12-30 NOTE — Patient Outreach (Signed)
Pt reports he is not checking his glucose because he doesn't have any testing supplies. He does have a monitor. He decided he didn't want to talk to me any more because he states he has an appt with his primary care provider tomorrow and he wants to make sure that this is what she wanted. I told him that was fine and if it is OK I will call him next week. He agreed to this.  [name] [facility] [place] [phone]

## 2014-12-31 ENCOUNTER — Encounter: Payer: Self-pay | Admitting: Internal Medicine

## 2014-12-31 ENCOUNTER — Ambulatory Visit (INDEPENDENT_AMBULATORY_CARE_PROVIDER_SITE_OTHER): Payer: PPO | Admitting: Internal Medicine

## 2014-12-31 VITALS — BP 110/56 | HR 80 | Ht 69.0 in | Wt 100.4 lb

## 2014-12-31 DIAGNOSIS — K319 Disease of stomach and duodenum, unspecified: Secondary | ICD-10-CM

## 2014-12-31 DIAGNOSIS — IMO0002 Reserved for concepts with insufficient information to code with codable children: Secondary | ICD-10-CM

## 2014-12-31 DIAGNOSIS — K3189 Other diseases of stomach and duodenum: Secondary | ICD-10-CM

## 2014-12-31 DIAGNOSIS — L409 Psoriasis, unspecified: Secondary | ICD-10-CM

## 2014-12-31 DIAGNOSIS — R634 Abnormal weight loss: Secondary | ICD-10-CM | POA: Diagnosis not present

## 2014-12-31 DIAGNOSIS — E1165 Type 2 diabetes mellitus with hyperglycemia: Secondary | ICD-10-CM | POA: Diagnosis not present

## 2014-12-31 MED ORDER — DIPHENHYDRAMINE HCL 25 MG PO CAPS: 25.0000 mg | ORAL_CAPSULE | ORAL | Status: DC | PRN

## 2014-12-31 MED ORDER — METFORMIN HCL 500 MG PO TABS: 500.0000 mg | ORAL_TABLET | Freq: Two times a day (BID) | ORAL | Status: DC

## 2014-12-31 NOTE — Progress Notes (Signed)
Date:  12/31/2014   Name:  Stephen [name].   DOB:  08-12-1945   MRN:  [id number]   Chief Complaint: Hospitalization Follow-up  patient was admitted to the hospital with severe hyperglycemia. He was noted to have weight loss which the patient had declined workup in the past however this time CT showed a gastric mass. The patient declined any other workup. He was initiated on insulin therapy and urged to follow up here for recheck. He is not taking insulin and he is not checking his blood sugars. He is back on metformin 500 mg twice a day. He says he feels well and is not interested in any more evaluation at this time. His palm are rash is much improved - he is using Benadryl for itching and home remedies.  Review of Systems:  Review of Systems  HENT: Negative for sore throat and trouble swallowing.   Respiratory: Negative for chest tightness and shortness of breath.   Cardiovascular: Negative for chest pain and palpitations.  Gastrointestinal: Negative for nausea, vomiting, abdominal pain, blood in stool and abdominal distention.  Genitourinary: Negative for difficulty urinating.  Skin: Positive for rash.  Neurological: Negative for syncope, light-headedness and headaches.  Psychiatric/Behavioral: Negative for confusion.    Patient Active Problem List   Diagnosis Date Noted  . Rash and nonspecific skin eruption 12/15/2014  . Hyponatremia 12/15/2014  . Thrombocytopenia 12/15/2014  . 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 Malnutrition 12/15/2014  . Alcohol abuse 12/15/2014  . Tobacco abuse 12/15/2014  . Cholelithiasis 12/15/2014  . Hyperosmolar non-ketotic state in patient with type 2 diabetes mellitus 12/14/2014  . Rash 12/14/2014  . Delusional disorder, unspecified type 12/14/2014  . Diabetes mellitus type 2, uncontrolled 09/18/2014  . Psoriasis 09/16/2014  . Excessive weight loss 09/16/2014    Prior to Admission medications   Medication Sig Start Date End Date Taking? Authorizing  Provider  clobetasol cream (TEMOVATE) 0.05 % Apply topically 2 (two) times daily. 12/15/14  Yes Theodoro Grist, MD  clobetasol ointment (TEMOVATE) 0.05 % APPLY TO AFFECTED AREA 2 (TWO) TIMES DAILY. 11/06/14  Yes Glean Hess, MD  diphenhydrAMINE (BENADRYL) 25 mg capsule Take 1 capsule (25 mg total) by mouth every 4 (four) hours as needed for itching. 12/15/14  Yes Theodoro Grist, MD  calamine lotion Apply topically as needed for itching. Patient not taking: Reported on 12/31/2014 12/15/14   Theodoro Grist, MD  insulin aspart (NOVOLOG) 100 UNIT/ML injection Inject 0-5 Units into the skin at bedtime. Patient not taking: Reported on 12/31/2014 12/15/14   Theodoro Grist, MD  insulin aspart (NOVOLOG) 100 UNIT/ML injection Inject 0-9 Units into the skin 3 (three) times daily with meals. Patient not taking: Reported on 12/31/2014 12/15/14   Theodoro Grist, MD  insulin glargine (LANTUS) 100 UNIT/ML injection Inject 0.08 mLs (8 Units total) into the skin daily. Patient not taking: Reported on 12/31/2014 12/15/14   Theodoro Grist, MD  nicotine (NICODERM CQ - DOSED IN MG/24 HOURS) 14 mg/24hr patch Place 1 patch (14 mg total) onto the skin daily. Patient not taking: Reported on 12/31/2014 12/15/14   Theodoro Grist, MD  nicotine (NICOTROL) 10 MG inhaler Inhale 1 cartridge (1 continuous puffing total) into the lungs as needed for smoking cessation. Patient not taking: Reported on 12/31/2014 12/15/14   Theodoro Grist, MD    No Known Allergies  Past Surgical History  Procedure Laterality Date  . Colonoscopy  1995    one polyp    Social History  Substance Use Topics  .  Smoking status: Current Every Day Smoker  . Smokeless tobacco: None     Comment: patient not ready to quit  . Alcohol Use: 0.0 oz/week    0 Standard drinks or equivalent per week     Comment: socially     Medication list has been reviewed and updated.  Physical Examination:  Physical Exam  Constitutional: He is oriented to person, place, and time. He  appears well-developed and well-nourished.  Eyes: Pupils are equal, round, and reactive to light.  Neck: Normal range of motion. Neck supple. No thyromegaly present.  Cardiovascular: Normal rate, regular rhythm and normal heart sounds.   Pulmonary/Chest: Effort normal and breath sounds normal.  Musculoskeletal: He exhibits no edema.  Lymphadenopathy:    He has no cervical adenopathy.  Neurological: He is alert and oriented to person, place, and time.  Skin:  Palm are rash is much improved - today there is just pigmentation changes and slight scaling but no pustules or significant thickening  Psychiatric: He has a normal mood and affect. His behavior is normal.  Nursing note and vitals reviewed.   BP 110/56 mmHg  Pulse 80  Ht 5\' 9"  (1.753 m)  Wt 100 lb 6.4 oz (45.541 kg)  BMI 14.82 kg/m2  Assessment and Plan: 1. Diabetes mellitus type 2, uncontrolled Patient declines insulin therapy - metFORMIN (GLUCOPHAGE) 500 MG tablet; Take 1 tablet (500 mg total) by mouth 2 (two) times daily with a meal.  Dispense: 60 tablet; Refill: 5  2. Excessive weight loss He's gained a few pounds; is not interested in any further evaluation  3. Gastric mass This was found on CT while in the hospital however patient is adamant he does not want any further evaluation. We did discuss if he begins to have any symptoms he should return for follow-up as soon as possible and he agrees.  4. Psoriasis - diphenhydrAMINE (BENADRYL) 25 mg capsule; Take 1 capsule (25 mg total) by mouth every 4 (four) hours as needed for itching.  Dispense: 100 capsule; Refill: [place], MD [facility]  12/31/2014

## 2015-01-01 ENCOUNTER — Other Ambulatory Visit: Payer: Self-pay | Admitting: Pharmacist

## 2015-01-01 NOTE — Patient Outreach (Signed)
Stephen [name]. is a 69 y.o. male referred to pharmacy for medication adherence and diabetes management. Patient reports that he is currently not taking his insulin because of affordability. Discuss Extra Help Application and Patient Assistance programs. Patient reports that he would appreciate help with completing these. However, patient reports that he is not interested in further pharmacy counseling, review or assistance.  PLAN:  1) Will send an InBasket message to Care Management Assistant Damita Rhodie asking her to reach out to the patient to assist with the Extra Help Application and patient assistance applications for Novolog and Lantus.  2) Will follow up with patient in 1 month.  Harlow Asa, PharmD Clinical Pharmacist [facility] [phone]

## 2015-01-06 ENCOUNTER — Other Ambulatory Visit: Payer: Self-pay | Admitting: *Deleted

## 2015-01-06 NOTE — Patient Outreach (Signed)
Second outreach call. Pt did not answer and I left a message to return my call. (1:10 pm).

## 2015-01-08 NOTE — Patient Outreach (Signed)
[facility]) Care Management  01/08/2015  Jacobe Study. [birth year] [id number]   Telephone outreach to patient to assist in applying for extra help and completing patient assistance applications. He was willing to do it over the phone.  We completed the application online and submitted it today.  He should hear from social security administration in the next 2 to 4 weeks.  He is going to call me back when he hears from them.  If he gets denied, I will begin the patient assistance application process for his Lantus and Novolog medications. I will follow up in 4 weeks.  Damita L. Rhodie, [place] Care Management Assistant

## 2015-01-22 ENCOUNTER — Other Ambulatory Visit: Payer: Self-pay | Admitting: Pharmacist

## 2015-01-22 ENCOUNTER — Other Ambulatory Visit: Payer: Self-pay | Admitting: *Deleted

## 2015-01-22 NOTE — Patient Outreach (Signed)
I was able to talk to Stephen [name] today. He reports he was turned down for LIS. I noted that he told his provider he was not interested in taking insulin and he wants to continue taking metformin. He told me he really isn't a diabetic, his sugars went up because he was put on steroids for his psoriasis. He says he just want to go on like he is and be his own boss and not have other people tell him how to take care of his own body.  I encouraged him to monitor his glucose levels and see his provider routinely.  I will notify Stephen [name] and Stephen [name] to close the case.  [name] [facility] [place] [phone]

## 2015-01-22 NOTE — Patient Outreach (Signed)
Received notification from [place] that patient received a denial letter in response to his Extra Help application and is not interested in pursuing further patient assistance for his insulins. Will close pharmacy episode in response to this communication.  Harlow Asa, PharmD Clinical Pharmacist [facility] [phone]

## 2015-01-23 NOTE — Patient Outreach (Signed)
[facility]) Care Management  01/23/2015  Joneric Streight. Mar 17, 1946 [id number]   Notification from [name], NP to close case due to patient refused [place] Management services.  Thanks, Ronnell Freshwater. [place], [place] Assistant Phone: [phone] Fax: [phone]

## 2015-01-23 NOTE — Patient Outreach (Signed)
[facility]) Care Management  01/23/2015  Stephen [name]. 17-Dec-1945 [id number]   Notification received from [name], NP that patient was denied extra help. Patient is also refusing any additional assistance from [facility]. I will close my case in assisting with patient assistance applications.  Meckenzie Balsley L. Alaysia Lightle, [place] Care Management Assistant

## 2015-01-29 ENCOUNTER — Other Ambulatory Visit: Payer: Self-pay

## 2015-01-29 MED ORDER — METFORMIN HCL 500 MG PO TABS: 500.0000 mg | ORAL_TABLET | Freq: Three times a day (TID) | ORAL | Status: DC

## 2015-02-24 ENCOUNTER — Other Ambulatory Visit: Payer: Self-pay | Admitting: Internal Medicine

## 2015-04-08 ENCOUNTER — Other Ambulatory Visit: Payer: Self-pay | Admitting: Internal Medicine

## 2015-04-22 ENCOUNTER — Other Ambulatory Visit: Payer: Self-pay | Admitting: Internal Medicine

## 2015-04-22 ENCOUNTER — Ambulatory Visit (INDEPENDENT_AMBULATORY_CARE_PROVIDER_SITE_OTHER): Payer: PPO | Admitting: Internal Medicine

## 2015-04-22 ENCOUNTER — Encounter: Payer: Self-pay | Admitting: Internal Medicine

## 2015-04-22 VITALS — BP 98/50 | HR 64 | Ht 69.0 in | Wt 97.2 lb

## 2015-04-22 DIAGNOSIS — L409 Psoriasis, unspecified: Secondary | ICD-10-CM

## 2015-04-22 DIAGNOSIS — R21 Rash and other nonspecific skin eruption: Secondary | ICD-10-CM | POA: Diagnosis not present

## 2015-04-22 DIAGNOSIS — E1101 Type 2 diabetes mellitus with hyperosmolarity with coma: Secondary | ICD-10-CM

## 2015-04-22 DIAGNOSIS — E1100 Type 2 diabetes mellitus with hyperosmolarity without nonketotic hyperglycemic-hyperosmolar coma (NKHHC): Secondary | ICD-10-CM

## 2015-04-22 MED ORDER — CEPHALEXIN 500 MG PO CAPS: 500.0000 mg | ORAL_CAPSULE | Freq: Four times a day (QID) | ORAL | Status: DC

## 2015-04-22 MED ORDER — INSULIN NPH ISOPHANE & REGULAR (70-30) 100 UNIT/ML ~~LOC~~ SUSP: 15.0000 [IU] | Freq: Every day | SUBCUTANEOUS | Status: DC

## 2015-04-22 MED ORDER — FLUCONAZOLE 100 MG PO TABS: 100.0000 mg | ORAL_TABLET | Freq: Every day | ORAL | Status: DC

## 2015-04-22 NOTE — Progress Notes (Signed)
Date:  04/22/2015   Name:  Stephen [name].   DOB:  Jun 21, 1945   MRN:  [id number]   Chief Complaint: Rash [name] comes in because the rash on his hands and feet is slightly worse. He still treating these with cortisone and onion application. He got concerned because his right foot was red and the skin was weeping.  He states that the skin on his palms is improving with topical cortisone cream and sometimes raw onion. He keeps his hands covered with latex gloves. He denies any drainage, purulence, or odor. Still declines insulin therapy for diabetes. He states that he does not want to be told what to do for his own health care. He also states that insulin is too expensive but when questioned about patient assistance he says he was turned down. We have never tried a expensive insulin such as Novolin 70/30. He would consider this medication if it were cost-effective. He also states that he needs additional test strips and supplies but will call back with the name of his meter.   Review of Systems  Constitutional: Negative for fever, chills, fatigue and unexpected weight change.  Respiratory: Negative for chest tightness and shortness of breath.   Cardiovascular: Negative for chest pain and leg swelling.  Gastrointestinal: Negative for nausea, vomiting and abdominal pain.  Skin: Positive for color change, rash and wound.  Neurological: Negative for tremors and weakness.  Psychiatric/Behavioral: Negative for confusion and dysphoric mood.    Patient Active Problem List   Diagnosis Date Noted  . Rash and nonspecific skin eruption 12/15/2014  . Hyponatremia 12/15/2014  . Thrombocytopenia ([place]) 12/15/2014  . 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 Malnutrition ([place]) 12/15/2014  . Alcohol abuse 12/15/2014  . Tobacco abuse 12/15/2014  . Cholelithiasis 12/15/2014  . Hyperosmolar non-ketotic state in patient with type 2 diabetes mellitus ([place]) 12/14/2014  . Rash 12/14/2014  . 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 Diabetes mellitus type 2, uncontrolled ([place]) 09/18/2014  . Psoriasis 09/16/2014  . Excessive weight loss 09/16/2014    Prior to Admission medications   Medication Sig Start Date End Date Taking? Authorizing Provider  calamine lotion Apply topically as needed for itching. 12/15/14  Yes Theodoro Grist, MD  clobetasol cream (TEMOVATE) 0.05 % Apply topically 2 (two) times daily. 12/15/14  Yes Theodoro Grist, MD  clobetasol ointment (TEMOVATE) 0.05 % APPLY TO AFFECTED AREA 2 (TWO) TIMES DAILY. 11/06/14  Yes Glean Hess, MD  [facility] ALLERGY 25 MG capsule TAKE 1 CAPSULE (25 MG TOTAL) BY MOUTH EVERY 4 (FOUR) HOURS AS NEEDED FOR ITCHING. 04/08/15  Yes Glean Hess, MD  metFORMIN (GLUCOPHAGE) 500 MG tablet Take 1 tablet (500 mg total) by mouth 3 (three) times daily with meals. 01/29/15  Yes Glean Hess, MD  mometasone (ELOCON) 0.1 % cream APPLY 1 APPLICATION TWO TIMES A DAY 02/24/15  Yes Glean Hess, MD  nicotine (NICODERM CQ - DOSED IN MG/24 HOURS) 14 mg/24hr patch Place 1 patch (14 mg total) onto the skin daily. Patient not taking: Reported on 12/31/2014 12/15/14   Theodoro Grist, MD  nicotine (NICOTROL) 10 MG inhaler Inhale 1 cartridge (1 continuous puffing total) into the lungs as needed for smoking cessation. Patient not taking: Reported on 12/31/2014 12/15/14   Theodoro Grist, MD    No Known Allergies  Past Surgical History  Procedure Laterality Date  . Colonoscopy  1995    one polyp    Social History  Substance Use Topics  .  Smoking status: Current Every Day Smoker  . Smokeless tobacco: None     Comment: patient not ready to quit  . Alcohol Use: 0.0 oz/week    0 Standard drinks or equivalent per week     Comment: socially     Medication list has been reviewed and updated.   Physical Exam  Constitutional: He is oriented to person, place, and time. He appears cachectic. No distress.  HENT:  Head: Normocephalic and atraumatic.  Eyes: Conjunctivae  are normal. Right eye exhibits no discharge. Left eye exhibits no discharge. No scleral icterus.  Cardiovascular: Normal rate, regular rhythm and normal heart sounds.   Pulmonary/Chest: Effort normal and breath sounds normal. No respiratory distress.  Musculoskeletal: Normal range of motion.  Neurological: He is alert and oriented to person, place, and time.  Skin: Skin is warm and dry. No rash noted.  Peeling and sloughing of skin on palms of both hands.  Yeasty odor noted.  Redness, warmth and serous oozing of skin of the right foot.  Pulses 2+.  Minimally tender to touch.  Psychiatric: He has a normal mood and affect. His behavior is normal. Thought content normal.    BP 98/50 mmHg  Pulse 64  Ht 5\' 9"  (1.753 m)  Wt 97 lb 3.2 oz (44.09 kg)  BMI 14.35 kg/m2  Assessment and Plan: 1. Psoriasis Continue topical cortisone Keflex for possible bacterial infection - cephALEXin (KEFLEX) 500 MG capsule; Take 1 capsule (500 mg total) by mouth 4 (four) times daily.  Dispense: 40 capsule; Refill: 0  2. Rash and nonspecific skin eruption Suspect yeast superinfection of hands - fluconazole (DIFLUCAN) 100 MG tablet; Take 1 tablet (100 mg total) by mouth daily.  Dispense: 7 tablet; Refill: 0  3. Hyperosmolar non-ketotic state in patient with type 2 diabetes mellitus ([place]) He agrees to try 70/30 insulin Start at 15 units daily Call with name of meter for test strips and lancets - insulin NPH-regular Human (NOVOLIN 70/30) (70-30) 100 UNIT/ML injection; Inject 15 Units into the skin daily with supper.  Dispense: 10 mL; Refill: [place], MD [facility]  04/22/2015

## 2015-05-01 ENCOUNTER — Encounter: Payer: Self-pay | Admitting: Emergency Medicine

## 2015-05-01 ENCOUNTER — Ambulatory Visit
Admission: EM | Admit: 2015-05-01 | Discharge: 2015-05-01 | Payer: PPO | Attending: Family Medicine | Admitting: Family Medicine

## 2015-05-01 ENCOUNTER — Observation Stay
Admission: EM | Admit: 2015-05-01 | Discharge: 2015-05-03 | Disposition: A | Discharge status: Home / Self Care | Payer: PPO | Attending: Internal Medicine | Admitting: Internal Medicine

## 2015-05-01 DIAGNOSIS — K269 Duodenal ulcer, unspecified as acute or chronic, without hemorrhage or perforation: Secondary | ICD-10-CM | POA: Insufficient documentation

## 2015-05-01 DIAGNOSIS — E46 Unspecified protein-calorie malnutrition: Secondary | ICD-10-CM | POA: Diagnosis not present

## 2015-05-01 DIAGNOSIS — E1169 Type 2 diabetes mellitus with other specified complication: Secondary | ICD-10-CM

## 2015-05-01 DIAGNOSIS — E1065 Type 1 diabetes mellitus with hyperglycemia: Secondary | ICD-10-CM

## 2015-05-01 DIAGNOSIS — E1165 Type 2 diabetes mellitus with hyperglycemia: Secondary | ICD-10-CM | POA: Diagnosis not present

## 2015-05-01 DIAGNOSIS — F17210 Nicotine dependence, cigarettes, uncomplicated: Secondary | ICD-10-CM | POA: Insufficient documentation

## 2015-05-01 DIAGNOSIS — Z86010 Personal history of colonic polyps: Secondary | ICD-10-CM | POA: Insufficient documentation

## 2015-05-01 DIAGNOSIS — E162 Hypoglycemia, unspecified: Secondary | ICD-10-CM

## 2015-05-01 DIAGNOSIS — K8020 Calculus of gallbladder without cholecystitis without obstruction: Secondary | ICD-10-CM | POA: Insufficient documentation

## 2015-05-01 DIAGNOSIS — R9431 Abnormal electrocardiogram [ECG] [EKG]: Secondary | ICD-10-CM | POA: Diagnosis not present

## 2015-05-01 DIAGNOSIS — R634 Abnormal weight loss: Secondary | ICD-10-CM | POA: Diagnosis not present

## 2015-05-01 DIAGNOSIS — D696 Thrombocytopenia, unspecified: Secondary | ICD-10-CM | POA: Insufficient documentation

## 2015-05-01 DIAGNOSIS — E108 Type 1 diabetes mellitus with unspecified complications: Secondary | ICD-10-CM

## 2015-05-01 DIAGNOSIS — R112 Nausea with vomiting, unspecified: Secondary | ICD-10-CM | POA: Insufficient documentation

## 2015-05-01 DIAGNOSIS — R739 Hyperglycemia, unspecified: Secondary | ICD-10-CM | POA: Diagnosis not present

## 2015-05-01 DIAGNOSIS — L409 Psoriasis, unspecified: Secondary | ICD-10-CM | POA: Insufficient documentation

## 2015-05-01 DIAGNOSIS — R55 Syncope and collapse: Secondary | ICD-10-CM | POA: Diagnosis not present

## 2015-05-01 DIAGNOSIS — R21 Rash and other nonspecific skin eruption: Secondary | ICD-10-CM | POA: Diagnosis not present

## 2015-05-01 DIAGNOSIS — K2980 Duodenitis without bleeding: Secondary | ICD-10-CM | POA: Diagnosis not present

## 2015-05-01 DIAGNOSIS — Z79899 Other long term (current) drug therapy: Secondary | ICD-10-CM | POA: Diagnosis not present

## 2015-05-01 DIAGNOSIS — E11649 Type 2 diabetes mellitus with hypoglycemia without coma: Secondary | ICD-10-CM | POA: Insufficient documentation

## 2015-05-01 DIAGNOSIS — F22 Delusional disorders: Secondary | ICD-10-CM | POA: Insufficient documentation

## 2015-05-01 DIAGNOSIS — E1069 Type 1 diabetes mellitus with other specified complication: Secondary | ICD-10-CM | POA: Diagnosis not present

## 2015-05-01 DIAGNOSIS — R42 Dizziness and giddiness: Secondary | ICD-10-CM | POA: Insufficient documentation

## 2015-05-01 DIAGNOSIS — R1013 Epigastric pain: Secondary | ICD-10-CM | POA: Diagnosis not present

## 2015-05-01 DIAGNOSIS — E41 Nutritional marasmus: Secondary | ICD-10-CM

## 2015-05-01 DIAGNOSIS — M25562 Pain in left knee: Secondary | ICD-10-CM | POA: Insufficient documentation

## 2015-05-01 DIAGNOSIS — R627 Adult failure to thrive: Secondary | ICD-10-CM | POA: Diagnosis not present

## 2015-05-01 DIAGNOSIS — E871 Hypo-osmolality and hyponatremia: Secondary | ICD-10-CM | POA: Diagnosis not present

## 2015-05-01 DIAGNOSIS — R531 Weakness: Secondary | ICD-10-CM | POA: Insufficient documentation

## 2015-05-01 DIAGNOSIS — IMO0002 Reserved for concepts with insufficient information to code with codable children: Secondary | ICD-10-CM

## 2015-05-01 DIAGNOSIS — E119 Type 2 diabetes mellitus without complications: Secondary | ICD-10-CM | POA: Diagnosis not present

## 2015-05-01 DIAGNOSIS — Z794 Long term (current) use of insulin: Secondary | ICD-10-CM | POA: Diagnosis not present

## 2015-05-01 DIAGNOSIS — Z681 Body mass index (BMI) 19 or less, adult: Secondary | ICD-10-CM | POA: Insufficient documentation

## 2015-05-01 DIAGNOSIS — E43 Unspecified severe protein-calorie malnutrition: Secondary | ICD-10-CM

## 2015-05-01 DIAGNOSIS — Z820 Family history of epilepsy and other diseases of the nervous system: Secondary | ICD-10-CM | POA: Insufficient documentation

## 2015-05-01 DIAGNOSIS — K319 Disease of stomach and duodenum, unspecified: Secondary | ICD-10-CM | POA: Diagnosis not present

## 2015-05-01 DIAGNOSIS — K861 Other chronic pancreatitis: Secondary | ICD-10-CM | POA: Insufficient documentation

## 2015-05-01 DIAGNOSIS — K3189 Other diseases of stomach and duodenum: Secondary | ICD-10-CM | POA: Diagnosis present

## 2015-05-01 DIAGNOSIS — R008 Other abnormalities of heart beat: Secondary | ICD-10-CM | POA: Diagnosis not present

## 2015-05-01 DIAGNOSIS — F1010 Alcohol abuse, uncomplicated: Secondary | ICD-10-CM | POA: Diagnosis not present

## 2015-05-01 DIAGNOSIS — E870 Hyperosmolality and hypernatremia: Secondary | ICD-10-CM | POA: Insufficient documentation

## 2015-05-01 DIAGNOSIS — E11641 Type 2 diabetes mellitus with hypoglycemia with coma: Secondary | ICD-10-CM | POA: Diagnosis not present

## 2015-05-01 HISTORY — DX: Disorder of the skin and subcutaneous tissue, unspecified: L98.9

## 2015-05-01 LAB — CBC
HCT: 43.4 % (ref 40.0–52.0)
Hemoglobin: 14.6 g/dL (ref 13.0–18.0)
MCH: 30.4 pg (ref 26.0–34.0)
MCHC: 33.5 g/dL (ref 32.0–36.0)
MCV: 90.6 fL (ref 80.0–100.0)
PLATELETS: 151 10*3/uL (ref 150–440)
RBC: 4.79 MIL/uL (ref 4.40–5.90)
RDW: 13.5 % (ref 11.5–14.5)
WBC: 8.9 10*3/uL (ref 3.8–10.6)

## 2015-05-01 LAB — COMPREHENSIVE METABOLIC PANEL
ALBUMIN: 3.6 g/dL (ref 3.5–5.0)
ALT: 18 U/L (ref 17–63)
ANION GAP: 10 (ref 5–15)
AST: 27 U/L (ref 15–41)
Alkaline Phosphatase: 85 U/L (ref 38–126)
BILIRUBIN TOTAL: 0.8 mg/dL (ref 0.3–1.2)
BUN: 22 mg/dL — AB (ref 6–20)
CHLORIDE: 92 mmol/L — AB (ref 101–111)
CO2: 30 mmol/L (ref 22–32)
Calcium: 9.3 mg/dL (ref 8.9–10.3)
Creatinine, Ser: 0.86 mg/dL (ref 0.61–1.24)
GFR calc Af Amer: >60 mL/min (ref >60)
GFR calc non Af Amer: >60 mL/min (ref >60)
GLUCOSE: 322 mg/dL — AB (ref 65–99)
POTASSIUM: 4.7 mmol/L (ref 3.5–5.1)
SODIUM: 132 mmol/L — AB (ref 135–145)
TOTAL PROTEIN: 6.3 g/dL — AB (ref 6.5–8.1)

## 2015-05-01 LAB — TSH: TSH: 1.717 u[IU]/mL (ref 0.350–4.500)

## 2015-05-01 LAB — GLUCOSE, CAPILLARY
GLUCOSE-CAPILLARY: 325 mg/dL — AB (ref 65–99)
GLUCOSE-CAPILLARY: 299 mg/dL — AB (ref 65–99)
Glucose-Capillary: 154 mg/dL — ABNORMAL HIGH (ref 65–99)

## 2015-05-01 LAB — TROPONIN I: Troponin I: <0.03 ng/mL (ref <0.031)

## 2015-05-01 LAB — SEDIMENTATION RATE: Sed Rate: 7 mm/hr (ref 0–20)

## 2015-05-01 MED ORDER — INSULIN ASPART 100 UNIT/ML ~~LOC~~ SOLN
0.0000 [IU] | Freq: Three times a day (TID) | SUBCUTANEOUS | Status: DC
  Administered 2015-05-02: 5 [IU] via SUBCUTANEOUS
  Administered 2015-05-02: 2 [IU] via SUBCUTANEOUS
  Administered 2015-05-03: 3 [IU] via SUBCUTANEOUS
  Administered 2015-05-03: 5 [IU] via SUBCUTANEOUS
  Filled 2015-05-01: qty 2
  Filled 2015-05-01 (×2): qty 5
  Filled 2015-05-01: qty 3

## 2015-05-01 MED ORDER — SODIUM CHLORIDE 0.9 % IJ SOLN
3.0000 mL | Freq: Two times a day (BID) | INTRAMUSCULAR | Status: DC
  Administered 2015-05-01 – 2015-05-03 (×3): 3 mL via INTRAVENOUS

## 2015-05-01 MED ORDER — SODIUM CHLORIDE 0.9 % IV SOLN
INTRAVENOUS | Status: DC
  Administered 2015-05-01: 21:00:00 via INTRAVENOUS

## 2015-05-01 MED ORDER — ACETAMINOPHEN 650 MG RE SUPP
650.0000 mg | Freq: Four times a day (QID) | RECTAL | Status: DC | PRN
Start: 2015-05-01 — End: 2015-05-03

## 2015-05-01 MED ORDER — INSULIN ASPART 100 UNIT/ML ~~LOC~~ SOLN
0.0000 [IU] | Freq: Every day | SUBCUTANEOUS | Status: DC
  Administered 2015-05-02: 2 [IU] via SUBCUTANEOUS
  Filled 2015-05-01: qty 2

## 2015-05-01 MED ORDER — HEPARIN SODIUM (PORCINE) 5000 UNIT/ML IJ SOLN
5000.0000 [IU] | Freq: Three times a day (TID) | INTRAMUSCULAR | Status: DC
  Administered 2015-05-01 – 2015-05-03 (×4): 5000 [IU] via SUBCUTANEOUS
  Filled 2015-05-01 (×4): qty 1

## 2015-05-01 MED ORDER — SODIUM CHLORIDE 0.9 % IV BOLUS (SEPSIS)
1000.0000 mL | Freq: Once | INTRAVENOUS | Status: AC
  Administered 2015-05-01: 1000 mL via INTRAVENOUS

## 2015-05-01 MED ORDER — ACETAMINOPHEN 325 MG PO TABS: 650.0000 mg | ORAL_TABLET | Freq: Four times a day (QID) | ORAL | Status: DC | PRN

## 2015-05-01 NOTE — ED Notes (Signed)
Admitting MD at bedside.

## 2015-05-01 NOTE — ED Notes (Signed)
Dr. Alveta Heimlich examined patient and EMS called to transport patient. Ice water po given to patient to sip

## 2015-05-01 NOTE — Discharge Instructions (Signed)
Dizziness Dizziness is a common problem. It makes you feel unsteady or lightheaded. You may feel like you are about to pass out (faint). Dizziness can lead to injury if you stumble or fall. Anyone can get dizzy, but dizziness is more common in older adults. This condition can be caused by a number of things, including:  Medicines.  Dehydration.  Illness. HOME CARE Following these instructions may help with your condition: Eating and Drinking  Drink enough fluid to keep your pee (urine) clear or pale yellow. This helps to keep you from getting dehydrated. Try to drink more clear fluids, such as water.  Do not drink alcohol.  Limit how much caffeine you drink or eat if told by your doctor.  Limit how much salt you drink or eat if told by your doctor. Activity  Avoid making quick movements.  When you stand up from sitting in a chair, steady yourself until you feel okay.  In the morning, first sit up on the side of the bed. When you feel okay, stand slowly while you hold onto something. Do this until you know that your balance is fine.  Move your legs often if you need to stand in one place for a long time. Tighten and relax your muscles in your legs while you are standing.  Do not drive or use heavy machinery if you feel dizzy.  Avoid bending down if you feel dizzy. Place items in your home so that they are easy for you to reach without leaning over. Lifestyle  Do not use any tobacco products, including cigarettes, chewing tobacco, or electronic cigarettes. If you need help quitting, ask your doctor.  Try to lower your stress level, such as with yoga or meditation. Talk with your doctor if you need help. General Instructions  Watch your dizziness for any changes.  Take medicines only as told by your doctor. Talk with your doctor if you think that your dizziness is caused by a medicine that you are taking.  Tell a friend or a family member that you are feeling dizzy. If he or  she notices any changes in your behavior, have this person call your doctor.  Keep all follow-up visits as told by your doctor. This is important. GET HELP IF:  Your dizziness does not go away.  Your dizziness or light-headedness gets worse.  You feel sick to your stomach (nauseous).  You have trouble hearing.  You have new symptoms.  You are unsteady on your feet or you feel like the room is spinning. GET HELP RIGHT AWAY IF:  You throw up (vomit) or have diarrhea and are unable to eat or drink anything.  You have trouble:  Talking.  Walking.  Swallowing.  Using your arms, hands, or legs.  You feel generally weak.  You are not thinking clearly or you have trouble forming sentences. It may take a friend or family member to notice this.  You have:  Chest pain.  Pain in your belly (abdomen).  Shortness of breath.  Sweating.  Your vision changes.  You are bleeding.  You have a headache.  You have neck pain or a stiff neck.  You have a fever.   This information is not intended to replace advice given to you by your health care provider. Make sure you discuss any questions you have with your health care provider.   Document Released: 03/18/2011 Document Revised: 08/13/2014 Document Reviewed: 03/25/2014 Elsevier Interactive Patient Education 2016 [employer].  How to Avoid Diabetes  Problems You can do a lot to prevent or slow down diabetes problems. Following your diabetes plan and taking care of yourself can reduce your risk of serious or life-threatening complications. Below, you will find certain things you can do to prevent diabetes problems. MANAGE YOUR DIABETES Follow your health care provider's, nurse educator's, and dietitian's instructions for managing your diabetes. They will teach you the basics of diabetes care. They can help answer questions you may have. Learn about diabetes and make healthy choices regarding eating and physical activity. Monitor  your blood glucose level regularly. Your health care provider will help you decide how often to check your blood glucose level depending on your treatment goals and how well you are meeting them.  DO NOT USE NICOTINE Nicotine and diabetes are a dangerous combination. Nicotine raises your risk for diabetes problems. If you quit using nicotine, you will lower your risk for heart attack, stroke, nerve disease, and kidney disease. Your cholesterol and your blood pressure levels may improve. Your blood circulation will also improve. Do not use any tobacco products, including cigarettes, chewing tobacco, or electronic cigarettes. If you need help quitting, ask your health care provider. KEEP YOUR BLOOD PRESSURE UNDER CONTROL Your health care provider will determine your individualized target blood pressure based on your age, your medicines, how long you have had diabetes, and any other medical conditions you have. Blood pressure consists of two numbers. Generally, the goal is to keep your top number (systolic pressure) at or below 130, and your bottom number (diastolic pressure) at or below 80. Your health care provider may recommend a lower target blood pressure reading, if appropriate. Meal planning, medicines, and exercise can help you reach your target blood pressure. Make sure your health care provider checks your blood pressure at every visit. KEEP YOUR CHOLESTEROL UNDER CONTROL Normal cholesterol levels will help prevent heart disease and stroke. These are the biggest health problems for people with diabetes. Keeping cholesterol levels under control can also help with blood flow. Have your cholesterol level checked at least once a year. Your health care provider may prescribe a medicine known as a statin. Statins lower your cholesterol. If you are not taking a statin, ask your health care provider if you should be. Meal planning, exercise, and medicines can help you reach your cholesterol targets.  SCHEDULE  AND KEEP YOUR ANNUAL PHYSICAL EXAMS AND EYE EXAMS Your health care provider will tell you how often he or she wants to see you depending on your plan of treatment. It is important that you keep these appointments so that possible problems can be identified early and complications can be avoided or treated.  Every visit with your health care provider should include your weight, blood pressure, and an evaluation of your blood glucose control.  Your hemoglobin A1c should be checked:  At least twice a year if you are at your goal.  Every 3 months if there are changes in treatment.  If you are not meeting your goals.  Your blood lipids should be checked yearly. You should also be checked yearly to see if you have protein in your urine (microalbumin).  Schedule a dilated eye exam within 5 years of your diagnosis if you have type 1 diabetes, and then yearly. Schedule a dilated eye exam at diagnosis if you have type 2 diabetes, and then yearly. All exams thereafter can be extended to every 2 to 3 years if one or more exams have been normal. KEEP YOUR VACCINES CURRENT It  is recommended that you receive a flu (influenza) vaccine every year. It is also recommended that you receive a pneumonia (pneumococcal) vaccine. If you are 105 years of age or older and have never received a pneumonia vaccine, this vaccine may be given as a series of two separate shots. Ask your health care provider which additional vaccines may be recommended. TAKE CARE OF YOUR FEET  Diabetes may cause you to have a poor blood supply (circulation) to your legs and feet. Because of this, the skin may be thinner, break easier, and heal more slowly. You also may have nerve damage in your legs and feet, causing decreased feeling. You may not notice minor injuries to your feet that could lead to serious problems or infections. Taking care of your feet is very important. Visual foot exams are performed at every routine medical visit. The exams  check for cuts, injuries, or other problems with the feet. A comprehensive foot exam should be done yearly. This includes visual inspection as well as assessing foot pulses and testing for loss of sensation. You should also do the following:  Inspect your feet daily for cuts, calluses, blisters, ingrown toenails, and signs of infection, such as redness, swelling, or pus.  Wash and dry your feet thoroughly, especially between the toes.  Avoid soaking your feet regularly in hot water baths.  Moisturize dry skin with lotion, avoiding areas between your toes.  Cut toenails straight across and file the edges.  Avoid shoes that do not fit well or have areas that irritate your skin.  Avoid going barefooted or wearing only socks. Your feet need protection. TAKE CARE OF YOUR TEETH People with poorly controlled diabetes are more likely to have gum (periodontal) disease. These infections make diabetes harder to control. Periodontal diseases, if left untreated, can lead to tooth loss. Brush your teeth twice a day, floss, and see your dentist for checkups and cleaning every 6 months, or 2 times a year. ASK YOUR HEALTH CARE PROVIDER ABOUT TAKING ASPIRIN Taking aspirin daily is recommended to help prevent cardiovascular disease in people with and without diabetes. Ask your health care provider if this would benefit you and what dose he or she would recommend. DRINK RESPONSIBLY Moderate amounts of alcohol (less than 1 drink per day for adult women and less than 2 drinks per day for adult men) have a minimal effect on blood glucose if ingested with food. It is important to eat food with alcohol to avoid hypoglycemia. People should avoid alcohol if they have a history of alcohol abuse or dependence, if they are pregnant, and if they have liver disease, pancreatitis, advanced neuropathy, or severe hypertriglyceridemia. LESSEN STRESS Living with diabetes can be stressful. When you are under stress, your blood  glucose may be affected in two ways:  Stress hormones may cause your blood glucose to rise.  You may be distracted from taking good care of yourself. It is a good idea to be aware of your stress level and make changes that are necessary to help you better manage challenging situations. Support groups, planned relaxation, a hobby you enjoy, meditation, healthy relationships, and exercise all work to lower your stress level. If your efforts do not seem to be helping, get help from your health care provider or a trained mental health professional.   This information is not intended to replace advice given to you by your health care provider. Make sure you discuss any questions you have with your health care provider.   Document  Released: 12/15/2010 Document Revised: 04/19/2014 Document Reviewed: 05/23/2013 Elsevier Interactive Patient Education 2016 [employer].  Malnutrition Malnutrition is any condition in which nutrition is poor. There are many forms of malnutrition. A common form is having too little of one kind of nutrient (nutritional deficiency). Nutrients include proteins, minerals, carbohydrates, fats, and vitamins. They provide the body with energy and keep the body working normally. Malnutrition ranges from mild to severe. The condition affects the body's defense system (immune system). Because of this, people who are malnourished are more likely to develop health problems and get sick. CAUSES Causes of malnutrition include:  Eating an unbalanced diet.  Eating too much of certain foods.  Eating too little.  Conditions that decrease the body's ability to use nutrients. RISK FACTORS Risk factors include:  Pregnancy and lactation. Women who are pregnant may become malnourished if they do not increase their nutrient intake. They are also susceptible to folic acid deficiency.  Increasing age. The body's ability to absorb nutrients decreases with age. This can contribute to iron,  calcium, and vitamin D deficiencies.  Alcohol or drug dependency. Addiction often leads to a lifestyle in which proper nourishment is ignored. Dependency can also hurt the metabolism and the body's ability to absorb nutrients. Alcoholism is a major cause of thiamine deficiency and can lead to deficiencies of magnesium, zinc, and other vitamins.  Eating disorders, such as anorexia nervosa. People with these disorders may eat too little or too much.  Chewing or swallowing problems. People with these disorders may not eat enough.  Certain diseases, including:  Long-lasting (chronic) diseases. Chronic diseases tend to affect the absorption of calcium, iron, and vitamins B12, A, D, E, and K.  Liver disease. Liver disease affects the storage of vitamins A and B12. It also interferes with the metabolism of protein and energy sources.  Kidney disease. Kidney disease may cause deficiencies of protein, iron, and vitamin D.  Cancer or AIDS. These diseases can cause a loss of appetite.  Cystic fibrosis. This disease can make it difficult for the body to absorb nutrients.  Certain diets, including.  The vegetarian diet. Vegetarians are at risk for iron deficiency.  The vegan diet. Vegans are susceptible to vitamin B12, calcium, iron, vitamin D, and zinc deficiencies.  The fruitarian diet. This diet can be deficient in protein, sodium, and many micronutrients.  Many commercial "fad" diets, including those that claim to enhance well-being and reduce weight.  Very low calorie diets.  Low income. People with a low income may have trouble paying for nutritious foods. SIGNS AND SYMPTOMS Signs and symptoms depend on the kind of malnutrition you have. Common symptoms include:  Fatigue.  Weakness.  Dizziness.  Fainting  Weight loss.  Poor immune response.  Lack of menstruation.  Hair loss.  Poor memory. DIAGNOSIS  Malnutrition may be diagnosed by:  A medical history.  A dietary  history.  A physical exam. This may include a measurement of your body mass index (BMI).  Blood tests. TREATMENT  Treatments vary depending on the cause of the malnutrition. Common treatments include:  Dietary changes.  Dietary supplements, such as vitamins and minerals.  Treatment of any underlying conditions. HOME CARE INSTRUCTIONS  Eat a balanced diet.  Take dietary supplements as directed by your health care provider.  Exercise regularly. Exercising can improve appetite.  Keep all follow-up visits as directed by your health care provider. This is important. PREVENTION Eating a well-balanced diet helps to prevent most forms of malnutrition. [place]  IF:  You have increased weakness or fatigue.  You faint.  You stop menstruating.  You have rapid hair loss.  You have unexpected weight loss.   This information is not intended to replace advice given to you by your health care provider. Make sure you discuss any questions you have with your health care provider.   Document Released: 02/12/2005 Document Revised: 04/19/2014 Document Reviewed: 11/23/2013 Elsevier Interactive Patient Education [employer].

## 2015-05-01 NOTE — ED Provider Notes (Signed)
[facility] Emergency Department Provider Note  ____________________________________________  Time seen: Approximately 2:45 PM  I have reviewed the triage vital signs and the nursing notes.   HISTORY  Chief Complaint Hyperglycemia    HPI Stephen [name]. is a 70 y.o. male with a history of DM sent from urgent care for lightheadedness and hyperglycemia. The patient states that he was feeling well yesterday. This morning he had some postural lightheadedness. It resolves if he sits or lays down. He had blood sugars in the 300s. No other symptoms including headache, visual changes, speech changes, confusion, numbness tickling or weakness, chest pain, shortness breath, palpitations. No recent illness including cough or cold symptoms, nausea or vomiting, diarrhea, or abdominal pain.   Past Medical History  Diagnosis Date  . Diabetes mellitus without complication ([place])   . Psoriasis   . Skin abnormalities     Patient Active Problem List   Diagnosis Date Noted  . Rash and nonspecific skin eruption 12/15/2014  . Hyponatremia 12/15/2014  . Thrombocytopenia ([place]) 12/15/2014  . 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 Malnutrition ([place]) 12/15/2014  . Alcohol abuse 12/15/2014  . Tobacco abuse 12/15/2014  . Cholelithiasis 12/15/2014  . Hyperosmolar non-ketotic state in patient with type 2 diabetes mellitus ([place]) 12/14/2014  . Rash 12/14/2014  . Delusional disorder, unspecified type 12/14/2014  . Diabetes mellitus type 2, uncontrolled ([place]) 09/18/2014  . Psoriasis 09/16/2014  . Excessive weight loss 09/16/2014    Past Surgical History  Procedure Laterality Date  . Colonoscopy  1995    one polyp    Current Outpatient Rx  Name  Route  Sig  Dispense  Refill  . calamine lotion   Topical   Apply topically as needed for itching.   120 mL   0   . cephALEXin (KEFLEX) 500 MG capsule   Oral   Take 1 capsule (500 mg total) by mouth 4 (four) times daily.   40 capsule    0   . clobetasol cream (TEMOVATE) 0.05 %   Topical   Apply topically 2 (two) times daily.   30 g   0   . clobetasol ointment (TEMOVATE) 0.05 %      APPLY TO AFFECTED AREA 2 (TWO) TIMES DAILY.   30 g   0   . [facility] ALLERGY 25 MG capsule      TAKE 1 CAPSULE (25 MG TOTAL) BY MOUTH EVERY 4 (FOUR) HOURS AS NEEDED FOR ITCHING.   100 capsule   3   . fluconazole (DIFLUCAN) 100 MG tablet   Oral   Take 1 tablet (100 mg total) by mouth daily.   7 tablet   0   . insulin NPH-regular Human (NOVOLIN 70/30) (70-30) 100 UNIT/ML injection   Subcutaneous   Inject 15 Units into the skin daily with supper.   10 mL   11   . metFORMIN (GLUCOPHAGE) 500 MG tablet   Oral   Take 1 tablet (500 mg total) by mouth 3 (three) times daily with meals.   90 tablet   5   . mometasone (ELOCON) 0.1 % cream      APPLY 1 APPLICATION TWO TIMES A DAY   60 g   3   . nicotine (NICODERM CQ - DOSED IN MG/24 HOURS) 14 mg/24hr patch   Transdermal   Place 1 patch (14 mg total) onto the skin daily. Patient not taking: Reported on 12/31/2014   28 patch   0   . nicotine (NICOTROL) 10 MG  inhaler   Inhalation   Inhale 1 cartridge (1 continuous puffing total) into the lungs as needed for smoking cessation. Patient not taking: Reported on 12/31/2014   42 each   0   . ONE TOUCH ULTRA TEST test strip      USE 1 STRIP FOUR TIMES A DAY DX 250.03.   400 each   3     Allergies Review of patient's allergies indicates no known allergies.  Family History  Problem Relation Age of Onset  . Migraines Father     Social History Social History  Substance Use Topics  . Smoking status: Current Every Day Smoker -- 1.00 packs/day    Types: Cigarettes  . Smokeless tobacco: None     Comment: patient not ready to quit  . Alcohol Use: 0.0 oz/week    0 Standard drinks or equivalent per week     Comment: socially    Review of Systems Constitutional: No fever/chills.  Positive postural lightheadedness. No  syncope. Eyes: No visual changes. ENT: No sore throat. Cardiovascular: Denies chest pain, palpitations. Respiratory: Denies shortness of breath.  No cough. Gastrointestinal: No abdominal pain.  No nausea, no vomiting.  No diarrhea.  No constipation. Genitourinary: Negative for dysuria. Musculoskeletal: Negative for back pain. Skin: Negative for rash. Neurological: Negative for headaches, focal weakness or numbness.  10-point ROS otherwise negative.  ____________________________________________   PHYSICAL EXAM:  VITAL SIGNS: ED Triage Vitals  Enc Vitals Group     BP 05/01/15 1438 137/83 mmHg     Pulse Rate 05/01/15 1438 91     Resp --      Temp 05/01/15 1438 98.2 F (36.8 C)     Temp Source 05/01/15 1438 Oral     SpO2 05/01/15 1438 97 %     Weight 05/01/15 1438 92 lb 2 oz (41.788 kg)     Height --      Head Cir --      Peak Flow --      Pain Score 05/01/15 1440 6     Pain Loc --      Pain Edu? --      Excl. in [place]? --     Constitutional: Patient is alert and oriented and able to answer questions appropriately. He is chronically ill-appearing and cachectic. Eyes: Conjunctivae are normal.  EOMI. no scleral icterus Head: Atraumatic. Nose: No congestion/rhinnorhea. Mouth/Throat: Mucous membranes are moist.  Neck: No stridor.  Supple.   Cardiovascular: Normal rate, regular rhythm. No murmurs, rubs or gallops.  Respiratory: Normal respiratory effort.  No retractions. Lungs CTAB.  No wheezes, rales or ronchi. Gastrointestinal: Soft and nontender. No distention. No peritoneal signs. Musculoskeletal: No LE edema.  Neurologic:  Normal speech and language. No gross focal neurologic deficits are appreciated.  Skin:  Skin is warm, dry and intact. N diffuse chronic discriminating rash which is most prominent on the palmar aspects of the hand, knuckles, and the feet.  Psychiatric: Mood and affect are normal. Speech and behavior are normal.  Normal  judgement.  ____________________________________________   LABS (all labs ordered are listed, but only abnormal results are displayed)  Labs Reviewed  COMPREHENSIVE METABOLIC PANEL - Abnormal; Notable for the following:    Sodium 132 (*)    Chloride 92 (*)    Glucose, Bld 322 (*)    BUN 22 (*)    Total Protein 6.3 (*)    All other components within normal limits  BLOOD GAS, VENOUS - Abnormal; Notable for the following:  pH, Ven 7.47 (*)    All other components within normal limits  GLUCOSE, CAPILLARY - Abnormal; Notable for the following:    Glucose-Capillary 299 (*)    All other components within normal limits  CBC  TROPONIN I  URINALYSIS COMPLETEWITH MICROSCOPIC ([facility] ONLY)   ____________________________________________  EKG  ED ECG REPORT I, Eula Listen, the attending physician, personally viewed and interpreted this ECG.   Date: 05/01/2015  EKG Time: 1438   Rate: 92  Rhythm: Trigeminy with a rate of 92  Axis: Normal  Intervals:none  ST&T Change: Nonspecific T-wave inversions in V1. No ST elevation. No ischemic changes.  ____________________________________________  RADIOLOGY  No results found.  ____________________________________________   PROCEDURES  Procedure(s) performed: None  Critical Care performed: No ____________________________________________   INITIAL IMPRESSION / ASSESSMENT AND PLAN / ED COURSE  Pertinent labs & imaging results that were available during my care of the patient were reviewed by me and considered in my medical decision making (see chart for details).  70 y.o. male with diabetes sent from urgent care for lightheadedness and hyperglycemia. The patient appears chronically ill appearing, but we will evaluate him for any acute causes. He does have trigeminy on his EKG and I do not have an old for comparison.   ----------------------------------------- 3:48 PM on  05/01/2015 -----------------------------------------  The patient has hyperglycemia without DKA. He does have trigeminy on his EKG and I don't have a previous one for comparison. We will admit him to the hospital for workup of this. He has mild hyponatremia and is receiving fluids. ____________________________________________  FINAL CLINICAL IMPRESSION(S) / ED DIAGNOSES  Final diagnoses:  Hyperglycemia  Lightheadedness  Hypoglycemia  Trigeminy      NEW MEDICATIONS STARTED DURING THIS VISIT:  New Prescriptions   No medications on file     Eula Listen, MD 05/01/15 1548

## 2015-05-01 NOTE — H&P (Signed)
[place] [facility] NAME: Stephen [name]    MR#:  [id number]  DATE OF BIRTH:  1946-01-18  DATE OF ADMISSION:  05/01/2015  PRIMARY CARE PHYSICIAN: [name], MD   REQUESTING/REFERRING PHYSICIAN: Dr Mariea Clonts  CHIEF COMPLAINT:   Chief Complaint  Patient presents with  . Hyperglycemia    HISTORY OF PRESENT ILLNESS:  Stephen [name]  is a 70 y.o. male with a known history of diabetes mellitus poorly controlled, psoriasis, gastric mass visualized on CT scan 12/2014 who presents today with lightheadedness. He initially presented to urgent care but then was directed to the emergency room due to hyperglycemia. He does not have any focal neurologic symptoms, no syncope, no chest pain or shortness of breath. He does report that he has had burping, nausea and vomiting for several weeks and has had a decreased appetite as a result. He estimates he has lost 60-70 pounds over the past year and he does not know why. He is usually active, walks daily but has been unable to do so for the past few weeks due to weakness. He also complains of new left knee pain, no swelling, no trauma. He has not had any fevers or chills. At this point he is so weak that he can apparently get out of bed, cannot walk without assistance.  In review of the notes from his primary care physician is seems that he has declined insulin therapy for diabetes despite very elevated blood sugars. A1c in 12/2014 was 17.0  PAST MEDICAL HISTORY:   Past Medical History  Diagnosis Date  . Diabetes mellitus without complication ([place])   . Psoriasis   . Skin abnormalities     PAST SURGICAL HISTORY:   Past Surgical History  Procedure Laterality Date  . Colonoscopy  1995    one polyp    SOCIAL HISTORY:   Social History  Substance Use Topics  . Smoking status: Current Every Day Smoker -- 1.00 packs/day    Types: Cigarettes  . Smokeless tobacco: Not on file     Comment: patient  not ready to quit  . Alcohol Use: 0.0 oz/week    0 Standard drinks or equivalent per week     Comment: socially    FAMILY HISTORY:   Family History  Problem Relation Age of Onset  . Migraines Father     DRUG ALLERGIES:  No Known Allergies  REVIEW OF SYSTEMS:   Review of Systems  Constitutional: Positive for malaise/fatigue. Negative for fever, chills and weight loss.  HENT: Negative for congestion and hearing loss.   Eyes: Negative for blurred vision and pain.  Respiratory: Negative for cough, hemoptysis, sputum production, shortness of breath and stridor.   Cardiovascular: Negative for chest pain, palpitations, orthopnea and leg swelling.  Gastrointestinal: Negative for nausea, vomiting, abdominal pain, diarrhea, constipation and blood in stool.  Genitourinary: Negative for dysuria and frequency.  Musculoskeletal: Negative for myalgias, back pain, joint pain and neck pain.  Skin: Positive for rash.  Neurological: Positive for weakness. Negative for focal weakness, loss of consciousness and headaches.  Endo/Heme/Allergies: Does not bruise/bleed easily.  Psychiatric/Behavioral: Negative for depression and hallucinations. The patient is not nervous/anxious.     MEDICATIONS AT HOME:   Prior to Admission medications   Medication Sig Start Date End Date Taking? Authorizing Provider  metFORMIN (GLUCOPHAGE) 500 MG tablet Take 1 tablet (500 mg total) by mouth 3 (three) times daily with meals. 01/29/15  Yes Glean Hess, MD  insulin NPH-regular Human (NOVOLIN 70/30) (70-30) 100 UNIT/ML injection Inject 15 Units into the skin daily with supper. 04/22/15   Glean Hess, MD      VITAL SIGNS:  Blood pressure 108/68, pulse 93, temperature 98.2 F (36.8 C), temperature source Oral, resp. rate 17, weight 41.788 kg (92 lb 2 oz), SpO2 97 %.  PHYSICAL EXAMINATION:  GENERAL:  70 y.o.-year-old patient lying in the bed with no acute distress. Cachectic EYES: Pupils equal, round,  reactive to light and accommodation. No scleral icterus. Extraocular muscles intact. Bilateral cataracts HEENT: Head atraumatic, normocephalic. Oropharynx and nasopharynx clear. Oral mucosa are dry NECK:  Supple, no jugular venous distention. No thyroid enlargement, no tenderness.  LUNGS: Normal breath sounds bilaterally, no wheezing, rales,rhonchi or crepitation. No use of accessory muscles of respiration.  CARDIOVASCULAR: S1, S2 normal. No murmurs, rubs, or gallops.  ABDOMEN: Soft, nontender, nondistended. Bowel sounds present. No organomegaly or mass. No guarding no rebound EXTREMITIES: No pedal edema, cyanosis, or clubbing. Pulses 2+ NEUROLOGIC: Cranial nerves II through XII are intact. Muscle strength 5/5 in all extremities. Sensation intact. Gait not checked.  PSYCHIATRIC: The patient is alert and oriented x 3.  SKIN: Desquamating lesions over the palms, soles of both hands and feet, erythema and induration behind both elbows and over the sacral bones  LABORATORY PANEL:   CBC  Recent Labs Lab 05/01/15 1457  WBC 8.9  HGB 14.6  HCT 43.4  PLT 151   ------------------------------------------------------------------------------------------------------------------  Chemistries   Recent Labs Lab 05/01/15 1457  NA 132*  K 4.7  CL 92*  CO2 30  GLUCOSE 322*  BUN 22*  CREATININE 0.86  CALCIUM 9.3  AST 27  ALT 18  ALKPHOS 85  BILITOT 0.8   ------------------------------------------------------------------------------------------------------------------  Cardiac Enzymes  Recent Labs Lab 05/01/15 1457  TROPONINI <0.03   ------------------------------------------------------------------------------------------------------------------  RADIOLOGY:  No results found.  EKG:   Orders placed or performed during the hospital encounter of 05/01/15  . ED EKG  . ED EKG    IMPRESSION AND PLAN:   1. Diabetes mellitus type 2: I suspect that most of his symptoms including  the dizziness today, weight loss, weakness are due to uncontrolled diabetes over a prolonged period of time. Burping nausea and vomiting likely due to gastroparesis. Check hemoglobin A1c. Start sliding scale insulin.  2. Gastric mass: Seen on CT scan in 12/2014. There was concern at that time due to the weight loss, weakness and food intolerance that this may be a malignancy. At that time he declined EGD. He is now willing to have an EGD. Se [facility] consult gastroenterology regarding need for further imaging versus EGD.  3. Rash/psoriasis: No overtly infected areas. Family states that he has been to dermatology at multiple centers without significant improvement in his rash. Continue topical cortisone. Check inflammatory markers and TSH.  4. Left knee pain: Will obtain x-ray of the knee  5. Prophylaxis: Heparin for DVT prophylaxis  6. Weakness: We'll obtain physical therapy consultation. This patient may need skilled nursing placement  All the records are reviewed and case discussed with ED provider. Management plans discussed with the patient and his daughter and they are in agreement.  CODE STATUS: Full  TOTAL TIME TAKING CARE OF THIS PATIENT: 45 minutes.  Greater than 50% of time spent in coordination of care and counseling.  Myrtis Ser M.D on 05/01/2015 at 4:36 PM  Between 7am to 6pm - Pager - [phone]  After 6pm go to [email or web address] - password [email or web address] [facility]  [facility]  Office  [phone]  CC: Primary care physician; [name], MD

## 2015-05-01 NOTE — ED Notes (Signed)
Patient apparently significant weight loss over last 2 years. Now weighs 90 lbs. Skin sloughing of bilateral hands and feet that daughter states "nobody knows what it is". Odor emitted from patient smells gangrenous like. Very emaciated and constantly asking for something to drink. Dr. Alveta Heimlich informed of Blood sugar and EKG given to him. Waiting to be evaluated by provider

## 2015-05-01 NOTE — ED Notes (Signed)
Ems from [name] urgent care for dizziness, hyperglycemic, sacral bedsore, weight loss (weighs 92.2 lbs)

## 2015-05-01 NOTE — ED Notes (Signed)
Brought in by daughter who states "is dizzy" Denies chest pain. "I don't feel right". States is "stumbling when walking today".

## 2015-05-01 NOTE — ED Provider Notes (Signed)
CSN: [id number]     Arrival date & time 05/01/15  1235 History   None    Chief Complaint  Patient presents with  . Dizziness   (Consider location/radiation/quality/duration/timing/severity/associated sxs/prior Treatment) Patient is a 70 y.o. male presenting with dizziness. The history is provided by the patient. No language interpreter was used.  Dizziness Quality:  Lightheadedness Severity:  Moderate Onset quality:  Unable to specify Timing:  Unable to specify Progression:  Worsening Chronicity:  Chronic Context: physical activity and standing up   Relieved by:  Nothing Ineffective treatments:  None tried Associated symptoms: nausea, palpitations and weakness   Associated symptoms: no shortness of breath     Past Medical History  Diagnosis Date  . Diabetes mellitus without complication ([place])   . Psoriasis   . Skin abnormalities    Past Surgical History  Procedure Laterality Date  . Colonoscopy  1995    one polyp   Family History  Problem Relation Age of Onset  . Migraines Father    Social History  Substance Use Topics  . Smoking status: Current Every Day Smoker -- 1.00 packs/day    Types: Cigarettes  . Smokeless tobacco: None     Comment: patient not ready to quit  . Alcohol Use: 0.0 oz/week    0 Standard drinks or equivalent per week     Comment: socially    Review of Systems  Constitutional: Negative for fatigue.  Respiratory: Negative for shortness of breath and wheezing.   Cardiovascular: Positive for palpitations.  Gastrointestinal: Positive for nausea.  Skin: Positive for rash.  Neurological: Positive for dizziness and weakness.  All other systems reviewed and are negative.   Allergies  Review of patient's allergies indicates no known allergies.  Home Medications   Prior to Admission medications   Medication Sig Start Date End Date Taking? Authorizing Provider  metFORMIN (GLUCOPHAGE) 500 MG tablet Take 1 tablet (500 mg total) by mouth 3 (three)  times daily with meals. 01/29/15  Yes Glean Hess, MD  calamine lotion Apply topically as needed for itching. 12/15/14   Theodoro Grist, MD  cephALEXin (KEFLEX) 500 MG capsule Take 1 capsule (500 mg total) by mouth 4 (four) times daily. 04/22/15   Glean Hess, MD  clobetasol cream (TEMOVATE) 0.05 % Apply topically 2 (two) times daily. 12/15/14   Theodoro Grist, MD  clobetasol ointment (TEMOVATE) 0.05 % APPLY TO AFFECTED AREA 2 (TWO) TIMES DAILY. 11/06/14   Glean Hess, MD  [facility] ALLERGY 25 MG capsule TAKE 1 CAPSULE (25 MG TOTAL) BY MOUTH EVERY 4 (FOUR) HOURS AS NEEDED FOR ITCHING. 04/08/15   Glean Hess, MD  fluconazole (DIFLUCAN) 100 MG tablet Take 1 tablet (100 mg total) by mouth daily. 04/22/15   Glean Hess, MD  insulin NPH-regular Human (NOVOLIN 70/30) (70-30) 100 UNIT/ML injection Inject 15 Units into the skin daily with supper. 04/22/15   Glean Hess, MD  mometasone (ELOCON) 0.1 % cream APPLY 1 APPLICATION TWO TIMES A DAY 02/24/15   Glean Hess, MD  nicotine (NICODERM CQ - DOSED IN MG/24 HOURS) 14 mg/24hr patch Place 1 patch (14 mg total) onto the skin daily. Patient not taking: Reported on 12/31/2014 12/15/14   Theodoro Grist, MD  nicotine (NICOTROL) 10 MG inhaler Inhale 1 cartridge (1 continuous puffing total) into the lungs as needed for smoking cessation. Patient not taking: Reported on 12/31/2014 12/15/14   Theodoro Grist, MD  ONE TOUCH ULTRA TEST test strip USE 1 STRIP FOUR TIMES  A DAY DX 250.03. 04/22/15   Glean Hess, MD   Meds Ordered and Administered this Visit  Medications - No data to display  BP 118/73 mmHg  Pulse 113  Temp(Src) 97.9 F (36.6 C) (Tympanic)  Resp 16  Ht 5\' 10"  (1.778 m)  Wt 90 lb (40.824 kg)  BMI 12.91 kg/m2  SpO2 96% No data found.   Physical Exam  Constitutional: He appears lethargic. He appears cachectic. He appears toxic. He has a sickly appearance. He appears ill.  HENT:  Head: Normocephalic.  Right Ear: Hearing normal.   Left Ear: Hearing normal.  Mouth/Throat: Abnormal dentition. Dental caries present.  Eyes: Right conjunctiva is injected. Left conjunctiva is injected.  Cardiovascular: Exam reveals distant heart sounds.   Pulmonary/Chest: He has no decreased breath sounds.  Musculoskeletal: He exhibits no tenderness.  Emaciated black male with marked peeling of the hands and feet  Neurological: He appears lethargic.  Patient is alert but very lethargic  Skin: Rash noted.  Skin peeling both hands feet  Vitals reviewed.   ED Course  Procedures (including critical care time)  Labs Review Labs Reviewed  GLUCOSE, CAPILLARY - Abnormal; Notable for the following:    Glucose-Capillary 325 (*)    All other components within normal limits  CBG MONITORING, ED    Imaging Review No results found.   Visual Acuity Review  Right Eye Distance:   Left Eye Distance:   Bilateral Distance:    Right Eye Near:   Left Eye Near:    Bilateral Near:         MDM   1. Diabetes mellitus with marked emaciation ([place])   2. Dizziness   3. Near syncope   4. Weakness   5. Uncontrolled type 1 diabetes mellitus with complication ([place])   6. Abnormal EKG    Patient has decided to go to [facility] for further treatment evaluation. Asked his daughter why they came here when he was at [facility] about 3 or 4 months ago and the father provided that he thought this was an emergency room. Daughter lives in [place] and recommended to her father that he goes to [facility] state she can visit him and help take care of him but he insists on going to [facility]. Daughter would like him transported by ambulance of possible will call EMS and will not charge nurse Colletta Maryland be aware of eminent transfer.   Patient: Emaciation generalized weakness or probably need IV fluids labs etc. and management we cannot provide here. In further talked to daughter she states that about 3 months ago he was admitted at [facility] had blood sugars over 700 and they found  according to his daughter the hospitalist informed her that #1 he felt his body just shut off because of the long-term need absence of insulin and he was in a cachectic state also based was some GI investigation they want to do but he refused and signed out. Will send him to [facility].  ED ECG REPORT I, Areonna Bran H, the attending physician, personally viewed and interpreted this ECG.   Date: 05/01/2015  EKG Time: 13:08:40  Rate: 98  Rhythm: there are no previous tracings available for comparison, occasional PVC noted, unifocal  Axis: 83  Intervals:none  ST&T Change: non specific ST changes,biatrial enlargement septal infarct w/PVC's  Frederich Cha, MD 05/01/15 1401

## 2015-05-02 ENCOUNTER — Observation Stay: Payer: PPO | Admitting: Anesthesiology

## 2015-05-02 ENCOUNTER — Other Ambulatory Visit: Payer: Self-pay

## 2015-05-02 ENCOUNTER — Encounter: Payer: Self-pay | Admitting: *Deleted

## 2015-05-02 ENCOUNTER — Ambulatory Visit: Payer: Self-pay | Admitting: Internal Medicine

## 2015-05-02 ENCOUNTER — Encounter
Admission: EM | Disposition: A | Discharge status: Home / Self Care | Payer: Self-pay | Source: Home / Self Care | Attending: Emergency Medicine

## 2015-05-02 DIAGNOSIS — K319 Disease of stomach and duodenum, unspecified: Secondary | ICD-10-CM | POA: Diagnosis not present

## 2015-05-02 DIAGNOSIS — K2980 Duodenitis without bleeding: Secondary | ICD-10-CM | POA: Diagnosis not present

## 2015-05-02 DIAGNOSIS — R531 Weakness: Secondary | ICD-10-CM | POA: Diagnosis not present

## 2015-05-02 DIAGNOSIS — R627 Adult failure to thrive: Secondary | ICD-10-CM | POA: Diagnosis not present

## 2015-05-02 DIAGNOSIS — K269 Duodenal ulcer, unspecified as acute or chronic, without hemorrhage or perforation: Secondary | ICD-10-CM | POA: Diagnosis not present

## 2015-05-02 DIAGNOSIS — R933 Abnormal findings on diagnostic imaging of other parts of digestive tract: Secondary | ICD-10-CM | POA: Diagnosis not present

## 2015-05-02 DIAGNOSIS — E119 Type 2 diabetes mellitus without complications: Secondary | ICD-10-CM | POA: Diagnosis not present

## 2015-05-02 HISTORY — PX: ESOPHAGOGASTRODUODENOSCOPY: SHX5428

## 2015-05-02 LAB — CBC
HCT: 39.7 % — ABNORMAL LOW (ref 40.0–52.0)
Hemoglobin: 13.3 g/dL (ref 13.0–18.0)
MCH: 31.1 pg (ref 26.0–34.0)
MCHC: 33.5 g/dL (ref 32.0–36.0)
MCV: 92.9 fL (ref 80.0–100.0)
PLATELETS: 132 10*3/uL — AB (ref 150–440)
RBC: 4.27 MIL/uL — AB (ref 4.40–5.90)
RDW: 13.7 % (ref 11.5–14.5)
WBC: 7.1 10*3/uL (ref 3.8–10.6)

## 2015-05-02 LAB — BASIC METABOLIC PANEL
ANION GAP: 7 (ref 5–15)
BUN: 16 mg/dL (ref 6–20)
CALCIUM: 8 mg/dL — AB (ref 8.9–10.3)
CO2: 27 mmol/L (ref 22–32)
Chloride: 102 mmol/L (ref 101–111)
Creatinine, Ser: 0.66 mg/dL (ref 0.61–1.24)
Glucose, Bld: 131 mg/dL — ABNORMAL HIGH (ref 65–99)
POTASSIUM: 4.3 mmol/L (ref 3.5–5.1)
SODIUM: 136 mmol/L (ref 135–145)

## 2015-05-02 LAB — GLUCOSE, CAPILLARY
GLUCOSE-CAPILLARY: 122 mg/dL — AB (ref 65–99)
GLUCOSE-CAPILLARY: 288 mg/dL — AB (ref 65–99)
GLUCOSE-CAPILLARY: 199 mg/dL — AB (ref 65–99)
Glucose-Capillary: 222 mg/dL — ABNORMAL HIGH (ref 65–99)

## 2015-05-02 LAB — BLOOD GAS, VENOUS
PATIENT TEMPERATURE: 37
PCO2 VEN: 44 mmHg (ref 44.0–60.0)
pH, Ven: 7.47 — ABNORMAL HIGH (ref 7.320–7.430)

## 2015-05-02 LAB — HEMOGLOBIN A1C: HEMOGLOBIN A1C: 14.5 % — AB (ref 4.0–6.0)

## 2015-05-02 SURGERY — EGD (ESOPHAGOGASTRODUODENOSCOPY)
Anesthesia: General

## 2015-05-02 MED ORDER — LIDOCAINE HCL (CARDIAC) 20 MG/ML IV SOLN
INTRAVENOUS | Status: DC | PRN
  Administered 2015-05-02: 30 mg via INTRAVENOUS

## 2015-05-02 MED ORDER — METFORMIN HCL 500 MG PO TABS: 500.0000 mg | ORAL_TABLET | Freq: Three times a day (TID) | ORAL | Status: DC

## 2015-05-02 MED ORDER — DIPHENHYDRAMINE HCL 25 MG PO CAPS: 25.0000 mg | ORAL_CAPSULE | Freq: Every evening | ORAL | Status: DC | PRN

## 2015-05-02 MED ORDER — MEGESTROL ACETATE 400 MG/10ML PO SUSP
400.0000 mg | Freq: Two times a day (BID) | ORAL | Status: DC
  Administered 2015-05-02 – 2015-05-03 (×2): 400 mg via ORAL
  Filled 2015-05-02 (×6): qty 10

## 2015-05-02 MED ORDER — PHENYLEPHRINE HCL 10 MG/ML IJ SOLN
INTRAMUSCULAR | Status: DC | PRN
  Administered 2015-05-02: 300 ug via INTRAVENOUS

## 2015-05-02 MED ORDER — PROPOFOL 500 MG/50ML IV EMUL
INTRAVENOUS | Status: DC | PRN
  Administered 2015-05-02: 150 ug/kg/min via INTRAVENOUS

## 2015-05-02 MED ORDER — METFORMIN HCL 500 MG PO TABS
500.0000 mg | ORAL_TABLET | Freq: Two times a day (BID) | ORAL | Status: DC
  Administered 2015-05-02: 500 mg via ORAL
  Filled 2015-05-02: qty 1

## 2015-05-02 MED ORDER — ZOLPIDEM TARTRATE 5 MG PO TABS
5.0000 mg | ORAL_TABLET | Freq: Every evening | ORAL | Status: DC | PRN
  Administered 2015-05-02: 5 mg via ORAL
  Filled 2015-05-02: qty 1

## 2015-05-02 MED ORDER — INSULIN GLARGINE 100 UNIT/ML ~~LOC~~ SOLN
6.0000 [IU] | Freq: Every day | SUBCUTANEOUS | Status: DC
  Administered 2015-05-02: 6 [IU] via SUBCUTANEOUS
  Filled 2015-05-02 (×2): qty 0.06

## 2015-05-02 MED ORDER — GLUCERNA SHAKE PO LIQD
237.0000 mL | Freq: Three times a day (TID) | ORAL | Status: DC
  Administered 2015-05-02 – 2015-05-03 (×2): 237 mL via ORAL

## 2015-05-02 MED ORDER — PANTOPRAZOLE SODIUM 40 MG PO TBEC
40.0000 mg | DELAYED_RELEASE_TABLET | Freq: Every day | ORAL | Status: DC
  Administered 2015-05-02 – 2015-05-03 (×2): 40 mg via ORAL
  Filled 2015-05-02 (×5): qty 1

## 2015-05-02 MED ORDER — PROPOFOL 10 MG/ML IV BOLUS
INTRAVENOUS | Status: DC | PRN
  Administered 2015-05-02 (×2): 50 mg via INTRAVENOUS

## 2015-05-02 NOTE — Care Management Obs Status (Signed)
[facility] NOTIFICATION   Patient Details  Name: Stephen [name]. MRN: [id number] Date of Birth: [birth year]   Medicare Observation Status Notification Given:  Yes reviewed with patient and daughter.  Signed by daughter and sent to HIM     Beverly Sessions, RN 05/02/2015, 11:08 AM

## 2015-05-02 NOTE — Anesthesia Procedure Notes (Signed)
Date/Time: 05/02/2015 2:56 PM Performed by: Doreen Salvage Pre-anesthesia Checklist: Patient identified, Emergency Drugs available, Suction available and Patient being monitored Patient Re-evaluated:Patient Re-evaluated prior to inductionOxygen Delivery Method: Nasal cannula Intubation Type: IV induction Dental Injury: Teeth and Oropharynx as per pre-operative assessment  Comments: Nasal cannula with etCO2 monitoring

## 2015-05-02 NOTE — Evaluation (Cosign Needed)
Physical Therapy Evaluation Patient Details Name: Stephen [name]. MRN: [id number] DOB: 11/21/1945 Today's Date: 05/02/2015   History of Present Illness  Pt is admitted for hyperglycemia. Pt with poorly controlled DM along with known gastric mass since 12/2014.  Clinical Impression  Pt is a pleasant 70 y/o M who was admitted for hyperglycemia. Pt is currently is at baseline level. He is independent with all mobility without AD. No LOB noted during ambulation and safe technique performed. Pt has no further needs for PT services at this time. Plan to discharge current orders.     Follow Up Recommendations No PT follow up    Equipment Recommendations       Recommendations for Other Services       Precautions / Restrictions Precautions Precautions: None Restrictions Weight Bearing Restrictions: No      Mobility  Bed Mobility Overal bed mobility: Independent             General bed mobility comments: safe technique performed  Transfers Overall transfer level: Independent Equipment used: None             General transfer comment: transfers performed with safe technique. No LOB at this time  Ambulation/Gait Ambulation/Gait assistance: Supervision Ambulation Distance (Feet): 200 Feet Assistive device:  (held IV pole) Gait Pattern/deviations: Step-through pattern     General Gait Details: ambulated using IV pole and safe technique. Reciprocal gait pattern performed. Pt able to complete 10 walk test in 7 seconds demonstrating good speed.  Stairs            Wheelchair Mobility    Modified Rankin (Stroke Patients Only)       Balance Overall balance assessment: Modified Independent                                           Pertinent Vitals/Pain Pain Assessment: No/denies pain    Home Living Family/patient expects to be discharged to:: Private residence Living Arrangements: Alone   Type of Home: House Home Access: Stairs to  enter   Technical brewer of Steps: 1 Home Layout: One level Home Equipment: None Additional Comments: has access to equipment if necessary    Prior Function Level of Independence: Independent               Hand Dominance        Extremity/Trunk Assessment   Upper Extremity Assessment: Overall WFL for tasks assessed           Lower Extremity Assessment: Overall WFL for tasks assessed         Communication   Communication: No difficulties  Cognition Arousal/Alertness: Awake/alert Behavior During Therapy: WFL for tasks assessed/performed Overall Cognitive Status: Within Functional Limits for tasks assessed                      General Comments      Exercises        Assessment/Plan    PT Assessment Patent does not need any further PT services  PT Diagnosis     PT Problem List    PT Treatment Interventions     PT Goals (Current goals can be found in the Care Plan section) Acute Rehab PT Goals Patient Stated Goal: to get some food PT Goal Formulation: With patient Time For Goal Achievement: 05/02/15 Potential to Achieve Goals: Good    Frequency  Barriers to discharge        Co-evaluation               End of Session   Activity Tolerance: Patient tolerated treatment well Patient left: in bed;with bed alarm set Nurse Communication: Mobility status    Functional Assessment Tool Used: 10' walk gait speed Functional Limitation: Mobility: Walking and moving around Mobility: Walking and Moving Around Current Status [phone]): 0 percent impaired, limited or restricted Mobility: Walking and Moving Around Goal Status [phone]): 0 percent impaired, limited or restricted Mobility: Walking and Moving Around Discharge Status [id number]): 0 percent impaired, limited or restricted    Time: 1310-1333 PT Time Calculation (min) (ACUTE ONLY): 23 min   Charges:   PT Evaluation $PT Eval Low Complexity: 1 Procedure     PT G Codes:   PT  G-Codes **NOT FOR INPATIENT CLASS** Functional Assessment Tool Used: 10' walk gait speed Functional Limitation: Mobility: Walking and moving around Mobility: Walking and Moving Around Current Status [id number]): 0 percent impaired, limited or restricted Mobility: Walking and Moving Around Goal Status [id number]): 0 percent impaired, limited or restricted Mobility: Walking and Moving Around Discharge Status [id number]): 0 percent impaired, limited or restricted     Greggory Stallion performed and wrote up this initial evaluation. She was unable to run the note secondary to patient being off the floor. Therefore, supervisor ran the note. Greggory Stallion, PT DPT 05/02/2015 3:52 PM   Murphy,Dorriea 05/02/2015, 3:51 PM

## 2015-05-02 NOTE — Consult Note (Signed)
  Pt seen and examined. Please see D. Martin's notes. Abnormal CT done in 9/16. Refused EGD then. Now willing. Had clear liquids at 7 AM but NPO since. Will proceed with EGD later today. Thanks.

## 2015-05-02 NOTE — Anesthesia Preprocedure Evaluation (Signed)
Anesthesia Evaluation  Patient identified by MRN, date of birth, ID band Patient awake    Reviewed: Allergy & Precautions, NPO status , Patient's Chart, lab work & pertinent test results  Airway Mallampati: II  TM Distance: >3 FB Neck ROM: Limited    Dental  (+) Edentulous Upper, Edentulous Lower   Pulmonary Current Smoker,    Pulmonary exam normal breath sounds clear to auscultation       Cardiovascular Exercise Tolerance: Poor  Rhythm:Regular Rate:Normal     Neuro/Psych    GI/Hepatic   Endo/Other  diabetes, Poorly Controlled, Type 2Newly diagnosed--HbA1C was 14--BG today is 288. Was hyperosmolar, hyperglycemic.  Renal/GU      Musculoskeletal   Abdominal Very cachectic--BMI 14, weighs 45 kg--massive weight loss over the past year--suspected gastric mass.  Peds  Hematology   Anesthesia Other Findings   Reproductive/Obstetrics                             Anesthesia Physical Anesthesia Plan  ASA: IV  Anesthesia Plan: General   Post-op Pain Management:    Induction: Intravenous  Airway Management Planned: Simple Face Mask  Additional Equipment:   Intra-op Plan:   Post-operative Plan:   Informed Consent: I have reviewed the patients History and Physical, chart, labs and discussed the procedure including the risks, benefits and alternatives for the proposed anesthesia with the patient or authorized representative who has indicated his/her understanding and acceptance.     Plan Discussed with: CRNA and Surgeon  Anesthesia Plan Comments:         Anesthesia Quick Evaluation

## 2015-05-02 NOTE — Consult Note (Signed)
GI Inpatient Consult Note  Reason for Consult: Gastric Mass noted on CT   Attending Requesting Consult: Dr. Posey Pronto  History of Present Illness: Stephen [name]. is a 70 y.o. male with a known history of diabetes mellitus poorly controlled, psoriasis, and a gastric mass visualized on CT scan from 12/2014 who was admitted with lightheadedness. He gives the history of a 60-70 lb weight lost over the last year stating he doesn't have teeth making it hard for him to eat. He will drink Ensure at times. He denies reflux symptoms during my visit but I noted he endorsed burping, nausea and vomiting over several weeks a decreased appetite in his history and physical.  He tells me he went to Urgent care because he was extremely weak. He then directed to the Emergency department for further evaluation because of an elevated blood sugar. He has a known history of uncontrolled diabetes.    He was seen by Dr. Verdie Shire during his September 2016 admission for evaluation of his abnormal CT of his stomach with possible infiltrative mall. [name] at that time adamantly declined having the recommended EGD to evaluate his stomach.  He tells me today that he is willing to have the EGD because his daughter is insisting that he have it.    Past Medical History:  Past Medical History  Diagnosis Date  . Diabetes mellitus without complication ([place])   . Psoriasis   . Skin abnormalities     Problem List: Patient Active Problem List   Diagnosis Date Noted  . Rash and nonspecific skin eruption 12/15/2014  . Hyponatremia 12/15/2014  . Thrombocytopenia ([place]) 12/15/2014  . 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 Malnutrition ([place]) 12/15/2014  . Alcohol abuse 12/15/2014  . Tobacco abuse 12/15/2014  . Cholelithiasis 12/15/2014  . Hyperosmolar non-ketotic state in patient with type 2 diabetes mellitus ([place]) 12/14/2014  . Rash 12/14/2014  . Delusional disorder, unspecified type 12/14/2014  . Diabetes mellitus type 2, uncontrolled  ([place]) 09/18/2014  . Psoriasis 09/16/2014  . Excessive weight loss 09/16/2014    Past Surgical History: Past Surgical History  Procedure Laterality Date  . Colonoscopy  1995    one polyp    Allergies: No Known Allergies  Home Medications: Prescriptions prior to admission  Medication Sig Dispense Refill Last Dose  . metFORMIN (GLUCOPHAGE) 500 MG tablet Take 1 tablet (500 mg total) by mouth 3 (three) times daily with meals. 90 tablet 5 04/30/2015 at Unknown time  . insulin NPH-regular Human (NOVOLIN 70/30) (70-30) 100 UNIT/ML injection Inject 15 Units into the skin daily with supper. 10 mL 11 Unknown at Unknown time   Home medication reconciliation was completed with the patient.   Scheduled Inpatient Medications:   . heparin  5,000 Units Subcutaneous 3 times per day  . insulin aspart  0-5 Units Subcutaneous QHS  . insulin aspart  0-9 Units Subcutaneous TID WC  . insulin glargine  6 Units Subcutaneous QHS  . sodium chloride  3 mL Intravenous Q12H    Continuous Inpatient Infusions:   . sodium chloride 75 mL/hr at 05/01/15 2104    PRN Inpatient Medications:  acetaminophen **OR** acetaminophen  Family History: family history includes Migraines in his father.  The patient's family history is negative for inflammatory bowel disorders, GI malignancy, or solid organ transplantation.  Social History:   reports that he has been smoking Cigarettes.  He has been smoking about 1.00 pack per day. He does not have any smokeless tobacco history on file. He reports  that he drinks alcohol. He reports that he uses illicit drugs about twice per week. The patient denies ETOH, tobacco, or drug use.    Review of Systems: Constitutional: Weight is stable.  Eyes: No changes in vision. ENT: No oral lesions, sore throat.  GI: see HPI.  Heme/Lymph: No easy bruising.  CV: No chest pain.  GU: No hematuria.  Integumentary: No rashes.  Neuro: No headaches.  Psych: No depression/anxiety.   Endocrine: No heat/cold intolerance.  Allergic/Immunologic: No urticaria.  Resp: No cough, SOB.  Musculoskeletal: No joint swelling.   Physical Examination: BP 105/63 mmHg  Pulse 87  Temp(Src) 98.7 F (37.1 C) (Oral)  Resp 17  Ht 5\' 10"  (1.778 m)  Wt 44.906 kg (99 lb)  BMI 14.20 kg/m2  SpO2 98% Gen: NAD, alert and oriented x 4. Cachetic.  HEENT: Normocephalic.Atraumatic. anicteric.  Neck: supple, no JVD or thyromegaly Chest: CTA bilaterally, no wheezes, crackles, or other adventitious sounds CV: RRR, no m/g/c/r Abd: soft, NT, ND, +BS in all four quadrants; no HSM, guarding, ridigity, or rebound tenderness Ext: no edema, well perfused with 2+ pulses Skin: no rash or lesions noted Lymph: no LAD  Data: Lab Results  Component Value Date   WBC 7.1 05/02/2015   HGB 13.3 05/02/2015   HCT 39.7* 05/02/2015   MCV 92.9 05/02/2015   PLT 132* 05/02/2015    Recent Labs Lab 05/01/15 1457 05/02/15 0513  HGB 14.6 13.3   Lab Results  Component Value Date   NA 136 05/02/2015   K 4.3 05/02/2015   CL 102 05/02/2015   CO2 27 05/02/2015   BUN 16 05/02/2015   CREATININE 0.66 05/02/2015   Lab Results  Component Value Date   ALT 18 05/01/2015   AST 27 05/01/2015   ALKPHOS 85 05/01/2015   BILITOT 0.8 05/01/2015   No results for input(s): APTT, INR, PTT in the last 168 hours.  Sept 03, 2016 CT of Abd IMPRESSION: 1. Mass-like thickening throughout the gastric wall in the region of the body of the stomach, concerning for potential infiltrative gastric neoplasm. This is poorly evaluated on today's noncontrast CT examination, particularly given the under distention of the stomach. However, further evaluation with nonemergent endoscopy is recommended in the near future to exclude neoplasm. 2. Sequela of chronic pancreatitis, as above. 3. Cholelithiasis without evidence of acute cholecystitis at this time. 4. Extensive atherosclerosis.  Assessment/Plan: Mr. Camera is a 70  y.o. male with a gastric mass who has agreed to have an EGD for further evaluation to rule out malignancy.  I advised him on the indication, procedure details, benefits, limitations and potential complications such as infection, bleeding, perforation or reaction to medications. He agreed to proceed with EGD.   Recommendations:  EGD has been scheduled for this afternoon. Further recommendations to follow.    Thank you for the consult. Please call with questions or concerns. Patient seen with Dr. Verdie Shire under collaborative agreement.  Faye Ramsay, FNP

## 2015-05-02 NOTE — Progress Notes (Signed)
Pt states he urinated during his EGD. No output noted in chart.

## 2015-05-02 NOTE — Progress Notes (Signed)
[place] at [facility]                                                                                                                                                                                            Patient Demographics   Stephen [name], is a 70 y.o. male, DOB - 02-16-1946, [id number]  Admit date - 05/01/2015   Admitting Physician Aldean Jewett, MD  Outpatient Primary MD for the patient is [name], MD   LOS -   Subjective: Patient upset about not getting any food. States he is hungry denies any chest pain or shortness of breath     Review of Systems:   CONSTITUTIONAL: No documented fever. No fatigue, weakness. No weight gain, positive weight loss.  EYES: No blurry or double vision.  ENT: No tinnitus. No postnasal drip. No redness of the oropharynx.  RESPIRATORY: No cough, no wheeze, no hemoptysis. No dyspnea.  CARDIOVASCULAR: No chest pain. No orthopnea. No palpitations. No syncope.  GASTROINTESTINAL: No nausea, no vomiting or diarrhea. No abdominal pain. No melena or hematochezia.  GENITOURINARY: No dysuria or hematuria.  ENDOCRINE: No polyuria or nocturia. No heat or cold intolerance.  HEMATOLOGY: No anemia. No bruising. No bleeding.  INTEGUMENTARY: Positive rashes. No lesions.  MUSCULOSKELETAL: No arthritis. No swelling. No gout.  NEUROLOGIC: No numbness, tingling, or ataxia. No seizure-type activity.  PSYCHIATRIC: No anxiety. No insomnia. No ADD.    Vitals:   Filed Vitals:   05/01/15 1912 05/01/15 2027 05/02/15 0600 05/02/15 1238  BP: 121/77 114/67 117/70 105/63  Pulse: 89 95 87 87  Temp: 98.6 F (37 C) 98.7 F (37.1 C) 98.5 F (36.9 C) 98.7 F (37.1 C)  TempSrc: Oral Oral Oral Oral  Resp: 18 17 18 17   Height: 5\' 10"  (1.778 m)     Weight: 44.906 kg (99 lb)     SpO2: 97% 96% 98% 98%    Wt Readings from Last 3 Encounters:  05/01/15 44.906 kg (99 lb)  05/01/15 40.824 kg (90 lb)  04/22/15 44.09 kg  (97 lb 3.2 oz)     Intake/Output Summary (Last 24 hours) at 05/02/15 1334 Last data filed at 05/02/15 0900  Gross per 24 hour  Intake   2038 ml  Output    300 ml  Net   1738 ml    Physical Exam:   GENERAL: Pleasant-appearing in no apparent distress.  HEAD, EYES, EARS, NOSE AND THROAT: Atraumatic, normocephalic. Extraocular muscles are intact. Pupils equal and reactive to light. Sclerae anicteric. No conjunctival injection. No oro-pharyngeal erythema.  NECK: Supple. There is no jugular venous distention.  No bruits, no lymphadenopathy, no thyromegaly.  HEART: Regular rate and rhythm,. No murmurs, no rubs, no clicks.  LUNGS: Clear to auscultation bilaterally. No rales or rhonchi. No wheezes.  ABDOMEN: Soft, flat, nontender, nondistended. Has good bowel sounds. No hepatosplenomegaly appreciated.  EXTREMITIES: No evidence of any cyanosis, clubbing, or peripheral edema.  +2 pedal and radial pulses bilaterally.  NEUROLOGIC: The patient is alert, awake, and oriented x3 with no focal motor or sensory deficits appreciated bilaterally.  SKIN: Desquamating lesions over the palms, soles of both hands and feet, erythema and induration behind both elbows and over the sacral bones  Psych: Not anxious, depressed LN: No inguinal LN enlargement    Antibiotics   Anti-infectives    None      Medications   Scheduled Meds: . heparin  5,000 Units Subcutaneous 3 times per day  . insulin aspart  0-5 Units Subcutaneous QHS  . insulin aspart  0-9 Units Subcutaneous TID WC  . insulin glargine  6 Units Subcutaneous QHS  . sodium chloride  3 mL Intravenous Q12H   Continuous Infusions: . sodium chloride 75 mL/hr at 05/01/15 2104   PRN Meds:.acetaminophen **OR** acetaminophen   Data Review:   Micro Results No results found for this or any previous visit (from the past 240 hour(s)).  Radiology Reports No results found.   CBC  Recent Labs Lab 05/01/15 1457 05/02/15 0513  WBC 8.9 7.1  HGB  14.6 13.3  HCT 43.4 39.7*  PLT 151 132*  MCV 90.6 92.9  MCH 30.4 31.1  MCHC 33.5 33.5  RDW 13.5 13.7    Chemistries   Recent Labs Lab 05/01/15 1457 05/02/15 0513  NA 132* 136  K 4.7 4.3  CL 92* 102  CO2 30 27  GLUCOSE 322* 131*  BUN 22* 16  CREATININE 0.86 0.66  CALCIUM 9.3 8.0*  AST 27  --   ALT 18  --   ALKPHOS 85  --   BILITOT 0.8  --    ------------------------------------------------------------------------------------------------------------------ estimated creatinine clearance is 53.8 mL/min (by C-G formula based on Cr of 0.66). ------------------------------------------------------------------------------------------------------------------  Recent Labs  05/01/15 1842  HGBA1C 14.5*   ------------------------------------------------------------------------------------------------------------------ No results for input(s): CHOL, HDL, LDLCALC, TRIG, CHOLHDL, LDLDIRECT in the last 72 hours. ------------------------------------------------------------------------------------------------------------------  Recent Labs  05/01/15 1842  TSH 1.717   ------------------------------------------------------------------------------------------------------------------ No results for input(s): VITAMINB12, FOLATE, FERRITIN, TIBC, IRON, RETICCTPCT in the last 72 hours.  Coagulation profile No results for input(s): INR, PROTIME in the last 168 hours.  No results for input(s): DDIMER in the last 72 hours.  Cardiac Enzymes  Recent Labs Lab 05/01/15 1457  TROPONINI <0.03   ------------------------------------------------------------------------------------------------------------------ Invalid input(s): POCBNP    Assessment & Plan   1. Diabetes mellitus type 2: Hemoglobin A1c of 14. Patient will be started on low-dose insulin. We will adjust this based on his blood sugar  2. Gastric mass: Seen on CT scan in 12/2014. Patient has has been made nothing by mouth by  GI likely EGD later today  3. Rash/psoriasis: No overtly infected areas. Family states that he has been to dermatology at multiple centers without significant improvement in his rash. Continue topical cortisone. Sedimentation rate is normal needs outpatient dermatology follow-up  4. Weight loss possibly related to #2 patient can be started on Glucerna shakes  5. Prophylaxis: Heparin for DVT prophylaxis  6. Weakness : Elevate physical therapy recommendations and the evaulation      Code Status Orders        Start  Ordered   05/01/15 1918  Full code   Continuous     05/01/15 1917    Code Status History    Date Active Date Inactive Code Status Order ID Comments User Context   12/14/2014 10:35 AM 12/15/2014  3:49 PM Full Code [id number]  Theodoro Grist, MD Inpatient           Consults gi  DVT Prophylaxis  heparin  Lab Results  Component Value Date   PLT 132* 05/02/2015     Time Spent in minutes   40min   Dustin Flock M.D on 05/02/2015 at 1:34 PM  Between 7am to 6pm - Pager - [phone]  After 6pm go to [email or web address] - password [email or web address] [place] [facility]   Office  [phone]

## 2015-05-02 NOTE — Care Management (Signed)
Patient presents from home with hyperglycemia.  Adult daughter at bedside.  Per daughter patient lives at home alone and is independent of all activities, drives, and does not have any medical equipment. PT eval pending.

## 2015-05-02 NOTE — Progress Notes (Signed)
Inpatient Diabetes Program Recommendations  AACE/ADA: New Consensus Statement on Inpatient Glycemic Control (2015)  Target Ranges:  Prepandial:   less than 140 mg/dL      Peak postprandial:   less than 180 mg/dL (1-2 hours)      Critically ill patients:  140 - 180 mg/dL   Review of Glycemic Control  Results for Becker, ROCZEN KOEHNEN (MRN [id number]) as of 05/02/2015 09:56  Ref. Range 05/01/2015 13:13 05/01/2015 14:53 05/01/2015 20:57 05/02/2015 07:14  Glucose-Capillary Latest Ref Range: 65-99 mg/dL 325 (H) 299 (H) 154 (H) 122 (H)    Diabetes history: Type 2 diabetes Outpatient Diabetes medications: Novolin 70/30 insulin 15 units with supper, Glucophage 500mg  tid with meals Current orders for Inpatient glycemic control: Metformin 500mg  bid, Novolog 0-9 units tid with meals.  Inpatient Diabetes Program Recommendations: Patient's A1C is 14.5% an average blood sugar of over 355mg /dl over the past 3 months.  Although he is ordered insulin at home (ordered 04/22/15)- he has not been taking it because he is afraid of going too low and he complains of it being too expensive.  [employer] ([employer]) attempted to work with him in September but he decided to end their assistance (see 01/22/15 note from [employer]).  I spoke at length with he and his daughter and they would like some help- I have put in a referral. I have received verbal permission to have [place] speak to the daughter.    Patient/daughter must be educated on the use/type of insulin and the timing of meals in order to be successful in using it at home.   MD on discharge- Please start a low dose of long acting insulin for safety and adherence to the plan- family MD can increase as needed.  Lantus would be ideal to start but would need CM assistance for getting it free or low cost before he leaves.   Gentry Fitz, RN, BA, MHA, CDE Diabetes Coordinator Inpatient Diabetes Program  [phone] (Team Pager) [phone] ([place]) 05/02/2015 10:44 AM

## 2015-05-02 NOTE — Progress Notes (Signed)
Initial Nutrition Assessment   INTERVENTION:   Meals and Snacks: Cater to patient preferences as pt diet order just advanced; pt would benefit from addition of Carb Modified diet order if po intake adequate Medical Food Supplement Therapy: will recommend Glucerna Shake po TID, each supplement provides 220 kcal and 10 grams of protein Coordination of Care: will continue to assess for malnutrition using guidelines from AND/ASPEN on follow-up   NUTRITION DIAGNOSIS:   Inadequate oral intake related to poor appetite as evidenced by per patient/family report.  GOAL:   Patient will meet greater than or equal to 90% of their needs  MONITOR:    (Energy Intake, Anthropometrics, Digestive System, Electrolyte and Renal Profile)  REASON FOR ASSESSMENT:   Malnutrition Screening Tool    ASSESSMENT:   Pt admitted with elevated blood sugars. Pt with gastric mass seen on CT on 12/2014 however refused EGD at that time. Pt scheduled for EGD today 1/20. Per MD note, no gastric mass seen on EGD, duodenal ulcer present.  Past Medical History  Diagnosis Date  . Diabetes mellitus without complication ([place])   . Psoriasis   . Skin abnormalities     Diet Order:  Diet Heart Room service appropriate?: Yes; Fluid consistency:: Thin    Current Nutrition: Pt has been NPO for procedure, noted 100% of CL tray consumed last night.  Food/Nutrition-Related History: Per MST, pt with decreased appetite PTA.   Scheduled Medications:  . [MAR Hold] heparin  5,000 Units Subcutaneous 3 times per day  . [MAR Hold] insulin aspart  0-5 Units Subcutaneous QHS  . [MAR Hold] insulin aspart  0-9 Units Subcutaneous TID WC  . [MAR Hold] insulin glargine  6 Units Subcutaneous QHS  . pantoprazole  40 mg Oral Daily  . [MAR Hold] sodium chloride  3 mL Intravenous Q12H    Continuous Medications:  . sodium chloride Stopped (05/02/15 1511)     Electrolyte/Renal Profile and Glucose Profile:   Recent Labs Lab  05/01/15 1457 05/02/15 0513  NA 132* 136  K 4.7 4.3  CL 92* 102  CO2 30 27  BUN 22* 16  CREATININE 0.86 0.66  CALCIUM 9.3 8.0*  GLUCOSE 322* 131*   Protein Profile:  Recent Labs Lab 05/01/15 1457  ALBUMIN 3.6    Lab Results  Component Value Date   HGBA1C 14.5* 05/01/2015    Gastrointestinal Profile: Last BM:  05/01/2015   Nutrition-Focused Physical Exam Findings:  Unable to complete Nutrition-Focused physical exam at this time as pt out of room in procedure.    Weight Change: Per MST weight loss of >34lbs PTA. RD notes last admission 12/14/2014 pt weight of 105lbs, 6% weight loss in 4 months potentially.   Height:   Ht Readings from Last 1 Encounters:  05/01/15 5\' 10"  (1.778 m)    Weight:   Wt Readings from Last 1 Encounters:  05/01/15 99 lb (44.906 kg)    Wt Readings from Last 10 Encounters:  05/01/15 99 lb (44.906 kg)  05/01/15 90 lb (40.824 kg)  04/22/15 97 lb 3.2 oz (44.09 kg)  12/31/14 100 lb 6.4 oz (45.541 kg)  12/14/14 105 lb 14.4 oz (48.036 kg)  09/20/14 97 lb 3.2 oz (44.09 kg)  09/16/14 95 lb 6.4 oz (43.273 kg)    Ideal Body Weight:   75kg  BMI:  Body mass index is 14.2 kg/(m^2).  Estimated Nutritional Needs:   Kcal:  using IBW of 75kg, BEE: 1506kcals, TEE: (IF 1.1-1.3)(AF 1.2) [id number]  Protein:  83-98g protein (1.1-1.3g/kg)  Fluid:  1875-2268mL of fluid (25-100mL/kg)  EDUCATION NEEDS:   No education needs identified at this time   [place], RD, LDN Pager [phone] Weekend/On-Call Pager [phone]

## 2015-05-02 NOTE — Transfer of Care (Signed)
Immediate Anesthesia Transfer of Care Note  Patient: Stephen [name].  Procedure(s) Performed: Procedure(s): ESOPHAGOGASTRODUODENOSCOPY (EGD) (N/A)  Patient Location: PACU  Anesthesia Type:General  Level of Consciousness: sedated  Airway & Oxygen Therapy: Patient Spontanous Breathing and Patient connected to face mask oxygen  Post-op Assessment: Report given to RN and Post -op Vital signs reviewed and stable  Post vital signs: Reviewed and stable  Last Vitals:  Filed Vitals:   05/02/15 1413 05/02/15 1510  BP: 104/62 102/60  Pulse: 88   Temp: 37.1 C 36.1 C  Resp: 18 20    Complications: No apparent anesthesia complications

## 2015-05-02 NOTE — Op Note (Signed)
EGD showed NORMAL stomach. Does have duodenal erosions. Biopsies taken. Recommend once daily PPI's x min 4-6 weeks. Resume diet. Will sign off. Thanks.

## 2015-05-02 NOTE — Op Note (Signed)
[facility] Gastroenterology Patient Name: Stephen [name] Procedure Date: 05/02/2015 2:46 PM MRN: [id number] Account #: [id number] Date of Birth: 23-Feb-1946 Admit Type: Inpatient Age: 70 Room: [facility] ENDO ROOM 4 Gender: Male Note Status: Finalized Procedure:         Upper GI endoscopy Indications:       Epigastric abdominal pain, Abnormal CT of the GI tract Providers:         Lupita Dawn. Candace Cruise, MD Referring MD:      Lum Babe. Steve Rattler (Referring MD) Medicines:         Monitored Anesthesia Care Complications:     No immediate complications. Procedure:         Pre-Anesthesia Assessment:                    - Prior to the procedure, a History and Physical was                     performed, and patient medications, allergies and                     sensitivities were reviewed. The patient's tolerance of                     previous anesthesia was reviewed.                    - The risks and benefits of the procedure and the sedation                     options and risks were discussed with the patient. All                     questions were answered and informed consent was obtained.                    - After reviewing the risks and benefits, the patient was                     deemed in satisfactory condition to undergo the procedure.                    After obtaining informed consent, the endoscope was passed                     under direct vision. Throughout the procedure, the                     patient's blood pressure, pulse, and oxygen saturations                     were monitored continuously. The Endoscope was introduced                     through the mouth, and advanced to the second part of                     duodenum. The upper GI endoscopy was accomplished without                     difficulty. The patient tolerated the procedure well. Findings:      The examined esophagus was normal.      The entire examined stomach was normal. No mass seen.      A few  localized erosions  were found in the first part of the duodenum.       Biopsies were taken with a cold forceps for histology.      The exam was otherwise without abnormality. Impression:        - Normal esophagus.                    - Normal stomach.                    - Duodenal erosions. Biopsied.                    - The examination was otherwise normal. Recommendation:    - The findings and recommendations were discussed with the                     patient.                    - Continue present medications.                    - Await pathology results. Procedure Code(s): --- Professional ---                    [phone], Esophagogastroduodenoscopy, flexible, transoral;                     with biopsy, single or multiple Diagnosis Code(s): --- Professional ---                    K26.9, Duodenal ulcer, unspecified as acute or chronic,                     without hemorrhage or perforation                    R10.13, Epigastric pain                    R93.3, Abnormal findings on diagnostic imaging of other                     parts of digestive tract CPT copyright 2014 American Medical Association. All rights reserved. The codes documented in this report are preliminary and upon coder review may  be revised to meet current compliance requirements. Hulen Luster, MD 05/02/2015 3:11:39 PM This report has been signed electronically. Number of Addenda: 0 Note Initiated On: 05/02/2015 2:46 PM      [facility]

## 2015-05-02 NOTE — Telephone Encounter (Signed)
.  Fax received from pharmacy

## 2015-05-02 NOTE — Progress Notes (Signed)
Per [employer] policy, order for diphenhydramine PRN sleep was changed to zolpidem 5 mg PRN sleep because patient is > 72 years old.  Lenis Noon, PharmD 7:32 PM

## 2015-05-02 NOTE — Consult Note (Signed)
   [facility] CM Inpatient Consult   05/02/2015  Algin Osen. [birth year] [id number] Referral received. Permission granted from patient to speak with his daughter [name] @ [phone]. HIPPA verified.  Daughter states that her father is only taking Metformin.  She states he needs help with managing his diabetes with diet and medications.  She states he is strong will but has agreed to let her help him better manage his diabetes.  Verbal consent obtained.  Patient will receive post hospital follow up calls and assess for home visits.   Patient evaluated for community based chronic disease management services with [facility] as a benefit of patient's [employer].  Patient will receive post hospital discharge call and will be evaluated for monthly home visits for assessments and disease process education.  Patient will receive contact information and [facility] literature. Of note, [facility] Care Management services does not replace or interfere with any services that are arranged by inpatient case management or social work.  For additional questions or referrals please contact:   Natividad Brood, RN BSN [facility] Liaison  [phone] business mobile phone Toll free office [phone]

## 2015-05-02 NOTE — Anesthesia Postprocedure Evaluation (Signed)
Anesthesia Post Note  Patient: Stephen [name].  Procedure(s) Performed: Procedure(s) (LRB): ESOPHAGOGASTRODUODENOSCOPY (EGD) (N/A)  Patient location during evaluation: PACU Anesthesia Type: General Level of consciousness: awake Pain management: pain level controlled Vital Signs Assessment: post-procedure vital signs reviewed and stable Respiratory status: spontaneous breathing Cardiovascular status: blood pressure returned to baseline Postop Assessment: no headache Anesthetic complications: no    Last Vitals:  Filed Vitals:   05/02/15 1510 05/02/15 1524  BP: 102/60 104/48  Pulse:  84  Temp: 36.1 C 35.9 C  Resp: 20 13    Last Pain:  Filed Vitals:   05/02/15 1529  PainSc: 0-No pain                 [name] M

## 2015-05-03 DIAGNOSIS — K2980 Duodenitis without bleeding: Secondary | ICD-10-CM | POA: Diagnosis not present

## 2015-05-03 DIAGNOSIS — E119 Type 2 diabetes mellitus without complications: Secondary | ICD-10-CM | POA: Diagnosis not present

## 2015-05-03 DIAGNOSIS — R627 Adult failure to thrive: Secondary | ICD-10-CM | POA: Diagnosis not present

## 2015-05-03 DIAGNOSIS — R531 Weakness: Secondary | ICD-10-CM | POA: Diagnosis not present

## 2015-05-03 LAB — URINALYSIS COMPLETE WITH MICROSCOPIC (ARMC ONLY)
BACTERIA UA: NONE SEEN
Bilirubin Urine: NEGATIVE
Glucose, UA: >500 mg/dL — AB
Hgb urine dipstick: NEGATIVE
KETONES UR: NEGATIVE mg/dL
Leukocytes, UA: NEGATIVE
NITRITE: NEGATIVE
PROTEIN: NEGATIVE mg/dL
Specific Gravity, Urine: 1.013 (ref 1.005–1.030)
Squamous Epithelial / LPF: NONE SEEN
pH: 5 (ref 5.0–8.0)

## 2015-05-03 LAB — GLUCOSE, CAPILLARY
GLUCOSE-CAPILLARY: 298 mg/dL — AB (ref 65–99)
Glucose-Capillary: 216 mg/dL — ABNORMAL HIGH (ref 65–99)

## 2015-05-03 MED ORDER — GLUCERNA SHAKE PO LIQD: 237.0000 mL | Freq: Three times a day (TID) | ORAL | Status: DC

## 2015-05-03 MED ORDER — METFORMIN HCL 500 MG PO TABS
500.0000 mg | ORAL_TABLET | Freq: Three times a day (TID) | ORAL | Status: DC
  Administered 2015-05-03: 500 mg via ORAL
  Filled 2015-05-03: qty 1

## 2015-05-03 MED ORDER — PANTOPRAZOLE SODIUM 40 MG PO TBEC: 40.0000 mg | DELAYED_RELEASE_TABLET | Freq: Every day | ORAL | Status: DC

## 2015-05-03 NOTE — Progress Notes (Signed)
Patient A&O, no complaints of pain.  Ambulating in hallway.  Appetite good.  Discharge instructions reviewed with patient and daughter.  Both verbalized understanding and all questions were answered.  Patient discharged home via wheelchair in stable condition escorted by nursing staff.

## 2015-05-03 NOTE — Discharge Instructions (Signed)
Keep log of your sugars at home  Weakness Weakness is a lack of strength. You may feel weak all over your body or just in one part of your body. Weakness can be serious. In some cases, you may need more medical tests. HOME [place] a well-balanced diet.  Try to exercise every day.  Only take medicines as told by your doctor. GET HELP RIGHT AWAY IF:   You cannot do your normal daily activities.  You cannot walk up and down stairs, or you feel very tired when you do so.  You have shortness of breath or chest pain.  You have trouble moving parts of your body.  You have weakness in only one body part or on only one side of the body.  You have a fever.  You have trouble speaking or swallowing.  You cannot control when you pee (urinate) or poop (bowel movement).  You have black or bloody throw up (vomit) or poop.  Your weakness gets worse or spreads to other body parts.  You have new aches or pains. MAKE SURE YOU:   Understand these instructions.  Will watch your condition.  Will get help right away if you are not doing well or get worse.   This information is not intended to replace advice given to you by your health care provider. Make sure you discuss any questions you have with your health care provider.   Document Released: 03/11/2008 Document Revised: 09/28/2011 Document Reviewed: 05/28/2011 Elsevier Interactive Patient Education [employer].

## 2015-05-03 NOTE — Discharge Summary (Signed)
[place] [facility] [place] NAME: Stephen [name]    MR#:  [id number]  DATE OF BIRTH:  Feb 17, 1946  DATE OF ADMISSION:  05/01/2015 ADMITTING PHYSICIAN: Aldean Jewett, MD  DATE OF DISCHARGE: 05/03/15  PRIMARY CARE PHYSICIAN: [name], MD    ADMISSION DIAGNOSIS:  Lightheadedness [R42] Hypoglycemia [E16.2] Hyperglycemia [R73.9] Trigeminy [I49.8]  DISCHARGE DIAGNOSIS:  Uncontrolled DM-2 Duodenitis on EGD. No gastric mass noted on EGD Chronic skin rash SECONDARY DIAGNOSIS:   Past Medical History  Diagnosis Date  . Diabetes mellitus without complication ([place])   . Psoriasis   . Skin abnormalities     HOSPITAL COURSE:  Stephen [name]. is a 70 y.o. male with a history of DM sent from urgent care for lightheadedness and hyperglycemia. The patient states that he was feeling well yesterday. This morning he had some postural lightheadedness. It resolves if he sits or lays down. He had blood sugars in the 300s.   1. Diabetes mellitus type 2: Hemoglobin A1c of 14. Patient will be started on low-dose insulin. We will adjust this based on his blood sugar -resumed metformin. i have asked him to see his PCP to see if yhe can come of metformin and try different oral agents He is recommended to continue his NPH but not sure if he will be compliant with it.  2. Gastric mass: Seen on CT scan in 12/2014.  EGD showed no mass, some duodenal erosion Cont ppi  3. Rash/psoriasis: No overtly infected areas. Family states that he has been to dermatology at multiple centers without significant improvement in his rash. Continue topical cortisone. Sedimentation rate is normal needs outpatient dermatology follow-up  4. Weight loss possibly related to #2 patient can be started on Glucerna shakes  5. Prophylaxis: Heparin for DVT prophylaxis  6. Weakness : No PT recommendations  Pt agreeable and wants to go home  CONSULTS OBTAINED:     DRUG  ALLERGIES:  No Known Allergies  DISCHARGE MEDICATIONS:   Current Discharge Medication List    START taking these medications   Details  feeding supplement, GLUCERNA SHAKE, (GLUCERNA SHAKE) LIQD Take 237 mLs by mouth 3 (three) times daily between meals. Qty: 60 Can, Refills: 0    pantoprazole (PROTONIX) 40 MG tablet Take 1 tablet (40 mg total) by mouth daily. Qty: 30 tablet, Refills: 0      CONTINUE these medications which have NOT CHANGED   Details  insulin NPH-regular Human (NOVOLIN 70/30) (70-30) 100 UNIT/ML injection Inject 15 Units into the skin daily with supper. Qty: 10 mL, Refills: 11   Associated Diagnoses: Hyperosmolar non-ketotic state in patient with type 2 diabetes mellitus (HCC)    metFORMIN (GLUCOPHAGE) 500 MG tablet Take 1 tablet (500 mg total) by mouth 3 (three) times daily with meals. Qty: 270 tablet, Refills: 1        If you experience worsening of your admission symptoms, develop shortness of breath, life threatening emergency, suicidal or homicidal thoughts you must seek medical attention immediately by calling 911 or calling your MD immediately  if symptoms less severe.  You Must read complete instructions/literature along with all the possible adverse reactions/side effects for all the Medicines you take and that have been prescribed to you. Take any new Medicines after you have completely understood and accept all the possible adverse reactions/side effects.   Please note  You were cared for by a hospitalist during your hospital stay. If you have any questions about your discharge  medications or the care you received while you were in the hospital after you are discharged, you can call the unit and asked to speak with the hospitalist on call if the hospitalist that took care of you is not available. Once you are discharged, your primary care physician will handle any further medical issues. Please note that NO REFILLS for any discharge medications will be  authorized once you are discharged, as it is imperative that you return to your primary care physician (or establish a relationship with a primary care physician if you do not have one) for your aftercare needs so that they can reassess your need for medications and monitor your lab values. Today   SUBJECTIVE   No complaints  VITAL SIGNS:  Blood pressure 110/63, pulse 95, temperature 97.8 F (36.6 C), temperature source Oral, resp. rate 16, height 5\' 10"  (1.778 m), weight 44.906 kg (99 lb), SpO2 100 %.  I/O:   Intake/Output Summary (Last 24 hours) at 05/03/15 1112 Last data filed at 05/02/15 1700  Gross per 24 hour  Intake    740 ml  Output      0 ml  Net    740 ml    PHYSICAL EXAMINATION:  GENERAL:  70 y.o.-year-old patient lying in the bed with no acute distress. thin EYES: Pupils equal, round, reactive to light and accommodation. No scleral icterus. Extraocular muscles intact.  HEENT: Head atraumatic, normocephalic. Oropharynx and nasopharynx clear.  NECK:  Supple, no jugular venous distention. No thyroid enlargement, no tenderness.  LUNGS: Normal breath sounds bilaterally, no wheezing, rales,rhonchi or crepitation. No use of accessory muscles of respiration.  CARDIOVASCULAR: S1, S2 normal. No murmurs, rubs, or gallops.  ABDOMEN: Soft, non-tender, non-distended. Bowel sounds present. No organomegaly or mass.  EXTREMITIES: No pedal edema, cyanosis, or clubbing.  NEUROLOGIC: Cranial nerves II through XII are intact. Muscle strength 5/5 in all extremities. Sensation intact. Gait not checked.  PSYCHIATRIC: The patient is alert and oriented x 3.  SKIN: No obvious rash, lesion, or ulcer.   DATA REVIEW:   CBC   Recent Labs Lab 05/02/15 0513  WBC 7.1  HGB 13.3  HCT 39.7*  PLT 132*    Chemistries   Recent Labs Lab 05/01/15 1457 05/02/15 0513  NA 132* 136  K 4.7 4.3  CL 92* 102  CO2 30 27  GLUCOSE 322* 131*  BUN 22* 16  CREATININE 0.86 0.66  CALCIUM 9.3 8.0*  AST  27  --   ALT 18  --   ALKPHOS 85  --   BILITOT 0.8  --     Microbiology Results   No results found for this or any previous visit (from the past 240 hour(s)).  RADIOLOGY:  No results found.   Management plans discussed with the patient, family and they are in agreement.  CODE STATUS:     Code Status Orders        Start     Ordered   05/01/15 1918  Full code   Continuous     05/01/15 1917    Code Status History    Date Active Date Inactive Code Status Order ID Comments User Context   12/14/2014 10:35 AM 12/15/2014  3:49 PM Full Code [id number]  Theodoro Grist, MD Inpatient      TOTAL TIME TAKING CARE OF THIS PATIENT: 40 minutes.    Zayli Villafuerte M.D on 05/03/2015 at 11:12 AM  Between 7am to 6pm - Pager - [phone] After 6pm go to [email or web address] - password  [facility]  [facility]  Office  [phone]  CC: Primary care physician; [name], MD

## 2015-05-06 ENCOUNTER — Other Ambulatory Visit: Payer: Self-pay | Admitting: *Deleted

## 2015-05-06 ENCOUNTER — Encounter: Payer: Self-pay | Admitting: Internal Medicine

## 2015-05-06 DIAGNOSIS — K2980 Duodenitis without bleeding: Secondary | ICD-10-CM | POA: Insufficient documentation

## 2015-05-06 LAB — SURGICAL PATHOLOGY

## 2015-05-06 NOTE — Patient Outreach (Signed)
RNCM called pt as part of transition of care program. [facility] services explained and consent received verbally from pt. Transition of care flow-sheet completed. RNCM made an appointment to make a home visit with pt. Pt stated he has not picked up his insulin form the pharmacy and at first stated he could not afford it, later in the conversation the pt stated he just wasn't going to waste money on it. Pt admitted to being illiterate. Pt stated he doesn't really obey doctors orders he just does his own thing. Pt stated he had tried insulin in the past and it caused him to dip  Into the 40's and he is now scare of the effects..plan Pt stated he had not made a f/u appt with his MD.  RNCM placed a call to to pt's MD to talk with her related to pt's insulin. Received a call back from Dr. Gaspar Cola nurse who stated pt had been non-compliant with all efforts of prescribing insulin even when financial assistance offered. No new recommendation was given for an alternate medication. RNCM also made nurse aware pt needed a post hospital f/u appt. RNCM and nurse discussed pt's skin condition and his use of raw onion as a home remedy.   Plan: RNCM will call pt next week as continued part of the transition of care program. RNCM will visit pt this Friday as part of transition of care program.   Merlene Morse Jessamy Torosyan RN, BSN  [facility] Care Management [phone]

## 2015-05-07 ENCOUNTER — Encounter: Payer: Self-pay | Admitting: Gastroenterology

## 2015-05-09 ENCOUNTER — Ambulatory Visit: Payer: Self-pay | Admitting: *Deleted

## 2015-05-13 ENCOUNTER — Ambulatory Visit (INDEPENDENT_AMBULATORY_CARE_PROVIDER_SITE_OTHER): Payer: PPO | Admitting: Internal Medicine

## 2015-05-13 ENCOUNTER — Encounter: Payer: Self-pay | Admitting: Internal Medicine

## 2015-05-13 VITALS — BP 116/72 | HR 80 | Ht 70.0 in | Wt 102.2 lb

## 2015-05-13 DIAGNOSIS — K2980 Duodenitis without bleeding: Secondary | ICD-10-CM | POA: Diagnosis not present

## 2015-05-13 DIAGNOSIS — E1100 Type 2 diabetes mellitus with hyperosmolarity without nonketotic hyperglycemic-hyperosmolar coma (NKHHC): Secondary | ICD-10-CM

## 2015-05-13 DIAGNOSIS — L409 Psoriasis, unspecified: Secondary | ICD-10-CM | POA: Diagnosis not present

## 2015-05-13 NOTE — Progress Notes (Signed)
Date:  05/13/2015   Name:  Stephen [name].   DOB:  02/02/1946   MRN:  [id number]  Patient is here today for hospital follow-up. He is accompanied by his daughter. She reports moving her father in with her for the time being to get better control over his problems and medications.  Chief Complaint: Hospitalization Follow-up and Diabetes  Patient is here for hospital follow-up from [facility]. He was admitted on 05/01/2015 and discharged on 05/03/2015.  Admission was for uncontrolled diabetes with extremely high blood sugars and weakness. His blood sugars were brought under improved control with insulin therapy. His weakness workup included an EGD to evaluate for previously noted gastric mass for which the patient had declined further evaluation. EGD showed no gastric mass but duodenal erosions. Diabetes - his blood sugar was extremely high and he was started on Novolin 7030 in the hospital. Since being home he has not used the insulin. He has continued on metformin. He remains very reluctant to take insulin. He says he took it years ago and did not like how it made him feel - actually he had an episode of low blood sugar which scared him -  so his doctor told him to stop it. We discussed at length the reason for his weakness, weight loss and general feeling of poor health - this is primarily related to lack of insulin and diabetic control. Duodenitis - this was found on EGD. There was no evidence of gastric mass. He was started on Protonix 40 mg a day - his daughter believes they have this medication and she'll make sure that he takes it.   Review of Systems  Constitutional: Negative for fever, chills and fatigue.  Respiratory: Negative for cough, chest tightness and shortness of breath.   Cardiovascular: Negative for chest pain, palpitations and leg swelling.  Gastrointestinal: Positive for vomiting (when he tried to eat too large a meal). Negative for abdominal pain (no reflux ) and constipation.    Musculoskeletal: Negative for arthralgias and gait problem.  Skin: Positive for rash (both hands and feet - drying up ).  Psychiatric/Behavioral: Positive for dysphoric mood. Negative for suicidal ideas and self-injury.    Patient Active Problem List   Diagnosis Date Noted  . Duodenitis 05/06/2015  . Rash and nonspecific skin eruption 12/15/2014  . Hyponatremia 12/15/2014  . Thrombocytopenia ([place]) 12/15/2014  . Malnutrition ([place]) 12/15/2014  . Alcohol abuse 12/15/2014  . Tobacco abuse 12/15/2014  . Cholelithiasis 12/15/2014  . Hyperosmolar non-ketotic state in patient with type 2 diabetes mellitus ([place]) 12/14/2014  . Rash 12/14/2014  . Delusional disorder, unspecified type 12/14/2014  . Diabetes mellitus type 2, uncontrolled ([place]) 09/18/2014  . Psoriasis 09/16/2014  . Excessive weight loss 09/16/2014    Prior to Admission medications   Medication Sig Start Date End Date Taking? Authorizing Provider  feeding supplement, GLUCERNA SHAKE, (GLUCERNA SHAKE) LIQD Take 237 mLs by mouth 3 (three) times daily between meals. 05/03/15   Fritzi Mandes, MD  insulin NPH-regular Human (NOVOLIN 70/30) (70-30) 100 UNIT/ML injection Inject 15 Units into the skin daily with supper. Patient not taking: Reported on 05/06/2015 04/22/15   Glean Hess, MD  metFORMIN (GLUCOPHAGE) 500 MG tablet Take 1 tablet (500 mg total) by mouth 3 (three) times daily with meals. 05/02/15   Glean Hess, MD  pantoprazole (PROTONIX) 40 MG tablet Take 1 tablet (40 mg total) by mouth daily. 05/03/15   Fritzi Mandes, MD    No Known Allergies  Past Surgical History  Procedure Laterality Date  . Colonoscopy  1995    one polyp  . Esophagogastroduodenoscopy N/A 05/02/2015    Procedure: ESOPHAGOGASTRODUODENOSCOPY (EGD);  Surgeon: Hulen Luster, MD;  Location: [facility]COPY;  Service: Endoscopy;  Laterality: N/A;    Social History  Substance Use Topics  . Smoking status: Current Every Day Smoker -- 1.00 packs/day    Types:  Cigarettes  . Smokeless tobacco: Former Systems developer     Comment: patient not ready to quit  . Alcohol Use: 0.0 oz/week    0 Standard drinks or equivalent per week     Comment: socially     Medication list has been reviewed and updated.   Physical Exam  Constitutional: He appears cachectic.  Cardiovascular: Normal rate, regular rhythm and normal heart sounds.   Pulmonary/Chest: Breath sounds normal. He has no wheezes. He has no rhonchi.  Abdominal: Soft. There is no tenderness. There is no rebound.  Lymphadenopathy:    He has no cervical adenopathy.  Neurological: He is alert.  Skin:  Persistent peeling and scaling of the skin of both palms  Psychiatric: His speech is normal. He is combative (disagreeable - seems to be agreeing to "whatever" in order to get the visit over with). He exhibits a depressed mood.    BP 116/72 mmHg  Pulse 80  Ht 5\' 10"  (1.778 m)  Wt 102 lb 3.2 oz (46.358 kg)  BMI 14.66 kg/m2  Assessment and Plan: 1. Psoriasis improving  2. Uncontrolled type 2 diabetes mellitus with hyperosmolarity without coma, without long-term current use of insulin (HCC) Pt is agreeable to trying a low dose of insulin - 15 units daily Will recheck in 3 months and adjust slowly to avoid hypoglycemia  3. Duodenitis Take protonix for 30 days   [name], MD [facility]  05/13/2015

## 2015-05-23 ENCOUNTER — Other Ambulatory Visit: Payer: Self-pay | Admitting: *Deleted

## 2015-05-23 NOTE — Patient Outreach (Signed)
Transition of care call #3. Pt stating he had started the insulin as the MD had requested to "prove" to the doctor and his daughter that it was not the right thing for him. RNCM attempted to make a follow up visit and pt stated he was moving to [facility] with his daughter. Pt stated his sugars had improved with the insulin and he did not feel he needed to be followed by [facility] any more related to moving. RNCM asked pt how he felt about the move and he stated he was ok about it, that it had its pros and cons. Pt stated he would miss his independence. RNCM asked pt if he had any questions for RNCM about diabetes or diabetic diet and pt stated he knew all about it, " I may not have any education but  I am not ignorant." RNCM encouraged pt to continue his insulin and wished him good luck with moving in with his daughter.   Plan: RNCM will close pt's case per his request and related to his move.  Rutherford Limerick RN, BSN  [facility] Care Management [phone])

## 2015-06-18 ENCOUNTER — Other Ambulatory Visit: Payer: Self-pay | Admitting: Internal Medicine

## 2015-07-20 DIAGNOSIS — R52 Pain, unspecified: Secondary | ICD-10-CM | POA: Diagnosis not present

## 2015-07-20 DIAGNOSIS — R918 Other nonspecific abnormal finding of lung field: Secondary | ICD-10-CM | POA: Diagnosis not present

## 2015-07-20 DIAGNOSIS — L853 Xerosis cutis: Secondary | ICD-10-CM | POA: Diagnosis not present

## 2015-07-20 DIAGNOSIS — I10 Essential (primary) hypertension: Secondary | ICD-10-CM | POA: Diagnosis not present

## 2015-07-20 DIAGNOSIS — R627 Adult failure to thrive: Secondary | ICD-10-CM | POA: Diagnosis not present

## 2015-07-20 DIAGNOSIS — E119 Type 2 diabetes mellitus without complications: Secondary | ICD-10-CM | POA: Diagnosis not present

## 2015-07-20 DIAGNOSIS — R197 Diarrhea, unspecified: Secondary | ICD-10-CM | POA: Diagnosis not present

## 2015-07-20 DIAGNOSIS — L299 Pruritus, unspecified: Secondary | ICD-10-CM | POA: Diagnosis not present

## 2015-07-20 DIAGNOSIS — K2980 Duodenitis without bleeding: Secondary | ICD-10-CM | POA: Diagnosis not present

## 2015-07-20 DIAGNOSIS — R239 Unspecified skin changes: Secondary | ICD-10-CM | POA: Diagnosis not present

## 2015-07-20 DIAGNOSIS — R634 Abnormal weight loss: Secondary | ICD-10-CM | POA: Diagnosis not present

## 2015-07-20 DIAGNOSIS — R0600 Dyspnea, unspecified: Secondary | ICD-10-CM | POA: Diagnosis not present

## 2015-07-20 DIAGNOSIS — F17200 Nicotine dependence, unspecified, uncomplicated: Secondary | ICD-10-CM | POA: Diagnosis not present

## 2015-07-21 DIAGNOSIS — R0600 Dyspnea, unspecified: Secondary | ICD-10-CM | POA: Diagnosis not present

## 2015-07-21 DIAGNOSIS — R918 Other nonspecific abnormal finding of lung field: Secondary | ICD-10-CM | POA: Diagnosis not present

## 2015-07-29 DIAGNOSIS — M7989 Other specified soft tissue disorders: Secondary | ICD-10-CM | POA: Diagnosis not present

## 2015-07-29 DIAGNOSIS — R21 Rash and other nonspecific skin eruption: Secondary | ICD-10-CM | POA: Diagnosis not present

## 2015-07-29 DIAGNOSIS — F17200 Nicotine dependence, unspecified, uncomplicated: Secondary | ICD-10-CM | POA: Diagnosis not present

## 2015-07-29 DIAGNOSIS — L989 Disorder of the skin and subcutaneous tissue, unspecified: Secondary | ICD-10-CM | POA: Diagnosis not present

## 2015-07-29 DIAGNOSIS — E119 Type 2 diabetes mellitus without complications: Secondary | ICD-10-CM | POA: Diagnosis not present

## 2015-07-31 ENCOUNTER — Other Ambulatory Visit: Payer: Self-pay

## 2015-07-31 DIAGNOSIS — E1100 Type 2 diabetes mellitus with hyperosmolarity without nonketotic hyperglycemic-hyperosmolar coma (NKHHC): Secondary | ICD-10-CM

## 2015-07-31 MED ORDER — INSULIN NPH ISOPHANE & REGULAR (70-30) 100 UNIT/ML ~~LOC~~ SUSP: 15.0000 [IU] | Freq: Every day | SUBCUTANEOUS | Status: DC

## 2015-07-31 MED ORDER — PANTOPRAZOLE SODIUM 40 MG PO TBEC
40.0000 mg | DELAYED_RELEASE_TABLET | Freq: Every day | ORAL | Status: DC
Start: 2015-07-31 — End: 2017-07-05

## 2015-07-31 MED ORDER — MOMETASONE FUROATE 0.1 % EX CREA: TOPICAL_CREAM | Freq: Every day | CUTANEOUS | Status: DC

## 2015-07-31 MED ORDER — GLUCERNA SHAKE PO LIQD: 237.0000 mL | Freq: Three times a day (TID) | ORAL | Status: DC

## 2015-07-31 MED ORDER — METFORMIN HCL 500 MG PO TABS: 500.0000 mg | ORAL_TABLET | Freq: Three times a day (TID) | ORAL | Status: DC

## 2015-07-31 NOTE — Telephone Encounter (Signed)
Patient states that he has transferred pharmacies and needs all new rx's sent to pharmacy.

## 2015-08-15 ENCOUNTER — Other Ambulatory Visit: Payer: Self-pay | Admitting: Internal Medicine

## 2015-08-15 DIAGNOSIS — L409 Psoriasis, unspecified: Secondary | ICD-10-CM

## 2015-08-15 MED ORDER — CEPHALEXIN 500 MG PO CAPS: 500.0000 mg | ORAL_CAPSULE | Freq: Four times a day (QID) | ORAL | Status: DC

## 2015-09-11 DIAGNOSIS — I4949 Other premature depolarization: Secondary | ICD-10-CM | POA: Diagnosis not present

## 2015-09-11 DIAGNOSIS — F1099 Alcohol use, unspecified with unspecified alcohol-induced disorder: Secondary | ICD-10-CM | POA: Diagnosis not present

## 2015-09-11 DIAGNOSIS — F1020 Alcohol dependence, uncomplicated: Secondary | ICD-10-CM | POA: Diagnosis not present

## 2015-09-11 DIAGNOSIS — E119 Type 2 diabetes mellitus without complications: Secondary | ICD-10-CM | POA: Diagnosis not present

## 2015-09-11 DIAGNOSIS — E860 Dehydration: Secondary | ICD-10-CM | POA: Diagnosis not present

## 2015-09-11 DIAGNOSIS — I10 Essential (primary) hypertension: Secondary | ICD-10-CM | POA: Diagnosis not present

## 2015-09-11 DIAGNOSIS — F29 Unspecified psychosis not due to a substance or known physiological condition: Secondary | ICD-10-CM | POA: Diagnosis not present

## 2015-09-11 DIAGNOSIS — R000 Tachycardia, unspecified: Secondary | ICD-10-CM | POA: Diagnosis not present

## 2015-09-11 DIAGNOSIS — A047 Enterocolitis due to Clostridium difficile: Secondary | ICD-10-CM | POA: Diagnosis not present

## 2015-09-11 DIAGNOSIS — R45851 Suicidal ideations: Secondary | ICD-10-CM | POA: Diagnosis not present

## 2015-09-12 ENCOUNTER — Telehealth: Payer: Self-pay

## 2015-09-12 DIAGNOSIS — F29 Unspecified psychosis not due to a substance or known physiological condition: Secondary | ICD-10-CM | POA: Diagnosis not present

## 2015-09-12 DIAGNOSIS — R45851 Suicidal ideations: Secondary | ICD-10-CM | POA: Diagnosis not present

## 2015-09-12 DIAGNOSIS — R000 Tachycardia, unspecified: Secondary | ICD-10-CM | POA: Diagnosis not present

## 2015-09-12 DIAGNOSIS — F1020 Alcohol dependence, uncomplicated: Secondary | ICD-10-CM | POA: Diagnosis not present

## 2015-09-12 DIAGNOSIS — F1021 Alcohol dependence, in remission: Secondary | ICD-10-CM | POA: Insufficient documentation

## 2015-09-12 NOTE — Telephone Encounter (Signed)
[facility] social worker called after patient was admitted to [facility] when daughter called EMS afraid he would act on the suicide like comments. Caseworker wanted Dr. B to report any changes in psych and Dr. B states she has never witnessed this kind of behavior and knows patient wants to live. Caseworker also reports LOA and weight loss as well as patient drinking quart of vodka daily.  Patient agrees for counseling.

## 2015-09-13 DIAGNOSIS — R45851 Suicidal ideations: Secondary | ICD-10-CM | POA: Diagnosis not present

## 2015-09-13 DIAGNOSIS — F29 Unspecified psychosis not due to a substance or known physiological condition: Secondary | ICD-10-CM | POA: Diagnosis not present

## 2015-09-13 DIAGNOSIS — F1020 Alcohol dependence, uncomplicated: Secondary | ICD-10-CM | POA: Diagnosis not present

## 2015-09-18 DIAGNOSIS — R197 Diarrhea, unspecified: Secondary | ICD-10-CM | POA: Diagnosis not present

## 2015-09-18 DIAGNOSIS — R45851 Suicidal ideations: Secondary | ICD-10-CM | POA: Diagnosis not present

## 2015-09-18 DIAGNOSIS — F1099 Alcohol use, unspecified with unspecified alcohol-induced disorder: Secondary | ICD-10-CM | POA: Diagnosis not present

## 2015-09-18 DIAGNOSIS — F29 Unspecified psychosis not due to a substance or known physiological condition: Secondary | ICD-10-CM | POA: Diagnosis not present

## 2015-09-18 DIAGNOSIS — E860 Dehydration: Secondary | ICD-10-CM | POA: Diagnosis not present

## 2015-09-19 DIAGNOSIS — F1010 Alcohol abuse, uncomplicated: Secondary | ICD-10-CM | POA: Diagnosis not present

## 2015-09-19 DIAGNOSIS — E860 Dehydration: Secondary | ICD-10-CM | POA: Diagnosis not present

## 2015-09-19 DIAGNOSIS — A047 Enterocolitis due to Clostridium difficile: Secondary | ICD-10-CM | POA: Diagnosis not present

## 2015-09-20 DIAGNOSIS — G894 Chronic pain syndrome: Secondary | ICD-10-CM | POA: Diagnosis not present

## 2015-09-20 DIAGNOSIS — E46 Unspecified protein-calorie malnutrition: Secondary | ICD-10-CM | POA: Diagnosis not present

## 2015-09-20 DIAGNOSIS — Z7984 Long term (current) use of oral hypoglycemic drugs: Secondary | ICD-10-CM | POA: Diagnosis not present

## 2015-09-20 DIAGNOSIS — E119 Type 2 diabetes mellitus without complications: Secondary | ICD-10-CM | POA: Diagnosis not present

## 2015-09-20 DIAGNOSIS — R9431 Abnormal electrocardiogram [ECG] [EKG]: Secondary | ICD-10-CM | POA: Diagnosis not present

## 2015-09-20 DIAGNOSIS — F05 Delirium due to known physiological condition: Secondary | ICD-10-CM | POA: Diagnosis not present

## 2015-09-20 DIAGNOSIS — L988 Other specified disorders of the skin and subcutaneous tissue: Secondary | ICD-10-CM | POA: Diagnosis not present

## 2015-09-20 DIAGNOSIS — I10 Essential (primary) hypertension: Secondary | ICD-10-CM | POA: Diagnosis not present

## 2015-09-20 DIAGNOSIS — A0472 Enterocolitis due to Clostridium difficile, not specified as recurrent: Secondary | ICD-10-CM | POA: Insufficient documentation

## 2015-09-20 DIAGNOSIS — E873 Alkalosis: Secondary | ICD-10-CM | POA: Diagnosis not present

## 2015-09-20 DIAGNOSIS — L408 Other psoriasis: Secondary | ICD-10-CM | POA: Diagnosis not present

## 2015-09-20 DIAGNOSIS — R45851 Suicidal ideations: Secondary | ICD-10-CM | POA: Diagnosis not present

## 2015-09-20 DIAGNOSIS — F1020 Alcohol dependence, uncomplicated: Secondary | ICD-10-CM | POA: Diagnosis not present

## 2015-09-20 DIAGNOSIS — E569 Vitamin deficiency, unspecified: Secondary | ICD-10-CM | POA: Diagnosis not present

## 2015-09-20 DIAGNOSIS — M6250 Muscle wasting and atrophy, not elsewhere classified, unspecified site: Secondary | ICD-10-CM | POA: Diagnosis not present

## 2015-09-20 DIAGNOSIS — J189 Pneumonia, unspecified organism: Secondary | ICD-10-CM | POA: Diagnosis not present

## 2015-09-20 DIAGNOSIS — R2990 Unspecified symptoms and signs involving the nervous system: Secondary | ICD-10-CM | POA: Diagnosis not present

## 2015-09-20 DIAGNOSIS — A419 Sepsis, unspecified organism: Secondary | ICD-10-CM | POA: Diagnosis not present

## 2015-09-20 DIAGNOSIS — A047 Enterocolitis due to Clostridium difficile: Secondary | ICD-10-CM | POA: Diagnosis not present

## 2015-09-20 DIAGNOSIS — Z9119 Patient's noncompliance with other medical treatment and regimen: Secondary | ICD-10-CM | POA: Diagnosis not present

## 2015-09-20 DIAGNOSIS — R410 Disorientation, unspecified: Secondary | ICD-10-CM | POA: Diagnosis not present

## 2015-09-20 DIAGNOSIS — L409 Psoriasis, unspecified: Secondary | ICD-10-CM | POA: Diagnosis not present

## 2015-09-20 DIAGNOSIS — I2109 ST elevation (STEMI) myocardial infarction involving other coronary artery of anterior wall: Secondary | ICD-10-CM | POA: Diagnosis not present

## 2015-09-20 DIAGNOSIS — F329 Major depressive disorder, single episode, unspecified: Secondary | ICD-10-CM | POA: Diagnosis not present

## 2015-09-20 DIAGNOSIS — R509 Fever, unspecified: Secondary | ICD-10-CM | POA: Diagnosis not present

## 2015-09-20 DIAGNOSIS — J90 Pleural effusion, not elsewhere classified: Secondary | ICD-10-CM | POA: Diagnosis not present

## 2015-09-20 DIAGNOSIS — I2510 Atherosclerotic heart disease of native coronary artery without angina pectoris: Secondary | ICD-10-CM | POA: Diagnosis not present

## 2015-09-20 DIAGNOSIS — R000 Tachycardia, unspecified: Secondary | ICD-10-CM | POA: Diagnosis not present

## 2015-09-20 DIAGNOSIS — R0602 Shortness of breath: Secondary | ICD-10-CM | POA: Diagnosis not present

## 2015-09-20 DIAGNOSIS — R918 Other nonspecific abnormal finding of lung field: Secondary | ICD-10-CM | POA: Diagnosis not present

## 2015-09-20 DIAGNOSIS — E860 Dehydration: Secondary | ICD-10-CM | POA: Diagnosis not present

## 2015-09-20 DIAGNOSIS — M6281 Muscle weakness (generalized): Secondary | ICD-10-CM | POA: Diagnosis not present

## 2015-09-20 DIAGNOSIS — Z5189 Encounter for other specified aftercare: Secondary | ICD-10-CM | POA: Diagnosis not present

## 2015-09-20 DIAGNOSIS — F5109 Other insomnia not due to a substance or known physiological condition: Secondary | ICD-10-CM | POA: Diagnosis not present

## 2015-09-20 DIAGNOSIS — F29 Unspecified psychosis not due to a substance or known physiological condition: Secondary | ICD-10-CM | POA: Diagnosis not present

## 2015-09-20 DIAGNOSIS — K219 Gastro-esophageal reflux disease without esophagitis: Secondary | ICD-10-CM | POA: Diagnosis not present

## 2015-09-20 DIAGNOSIS — F17210 Nicotine dependence, cigarettes, uncomplicated: Secondary | ICD-10-CM | POA: Diagnosis not present

## 2015-09-20 DIAGNOSIS — Z681 Body mass index (BMI) 19 or less, adult: Secondary | ICD-10-CM | POA: Diagnosis not present

## 2015-09-20 DIAGNOSIS — F1010 Alcohol abuse, uncomplicated: Secondary | ICD-10-CM | POA: Diagnosis not present

## 2015-09-23 DIAGNOSIS — R509 Fever, unspecified: Secondary | ICD-10-CM | POA: Diagnosis not present

## 2015-09-23 DIAGNOSIS — R918 Other nonspecific abnormal finding of lung field: Secondary | ICD-10-CM | POA: Diagnosis not present

## 2015-10-07 DIAGNOSIS — L409 Psoriasis, unspecified: Secondary | ICD-10-CM | POA: Diagnosis not present

## 2015-10-07 DIAGNOSIS — L659 Nonscarring hair loss, unspecified: Secondary | ICD-10-CM | POA: Diagnosis not present

## 2015-10-07 DIAGNOSIS — L538 Other specified erythematous conditions: Secondary | ICD-10-CM | POA: Diagnosis not present

## 2015-10-07 DIAGNOSIS — D649 Anemia, unspecified: Secondary | ICD-10-CM | POA: Diagnosis not present

## 2015-10-07 DIAGNOSIS — M6250 Muscle wasting and atrophy, not elsewhere classified, unspecified site: Secondary | ICD-10-CM | POA: Diagnosis not present

## 2015-10-07 DIAGNOSIS — G894 Chronic pain syndrome: Secondary | ICD-10-CM | POA: Diagnosis not present

## 2015-10-07 DIAGNOSIS — M6281 Muscle weakness (generalized): Secondary | ICD-10-CM | POA: Diagnosis not present

## 2015-10-07 DIAGNOSIS — F05 Delirium due to known physiological condition: Secondary | ICD-10-CM | POA: Diagnosis not present

## 2015-10-07 DIAGNOSIS — K219 Gastro-esophageal reflux disease without esophagitis: Secondary | ICD-10-CM | POA: Diagnosis not present

## 2015-10-07 DIAGNOSIS — E569 Vitamin deficiency, unspecified: Secondary | ICD-10-CM | POA: Diagnosis not present

## 2015-10-07 DIAGNOSIS — L400 Psoriasis vulgaris: Secondary | ICD-10-CM | POA: Diagnosis not present

## 2015-10-07 DIAGNOSIS — A047 Enterocolitis due to Clostridium difficile: Secondary | ICD-10-CM | POA: Diagnosis not present

## 2015-10-07 DIAGNOSIS — E119 Type 2 diabetes mellitus without complications: Secondary | ICD-10-CM | POA: Diagnosis not present

## 2015-10-07 DIAGNOSIS — R2990 Unspecified symptoms and signs involving the nervous system: Secondary | ICD-10-CM | POA: Diagnosis not present

## 2015-10-07 DIAGNOSIS — L408 Other psoriasis: Secondary | ICD-10-CM | POA: Diagnosis not present

## 2015-10-07 DIAGNOSIS — F5109 Other insomnia not due to a substance or known physiological condition: Secondary | ICD-10-CM | POA: Diagnosis not present

## 2015-10-07 DIAGNOSIS — F1010 Alcohol abuse, uncomplicated: Secondary | ICD-10-CM | POA: Diagnosis not present

## 2015-10-07 DIAGNOSIS — Z5189 Encounter for other specified aftercare: Secondary | ICD-10-CM | POA: Diagnosis not present

## 2015-10-09 DIAGNOSIS — L400 Psoriasis vulgaris: Secondary | ICD-10-CM | POA: Diagnosis not present

## 2015-10-09 DIAGNOSIS — A047 Enterocolitis due to Clostridium difficile: Secondary | ICD-10-CM | POA: Diagnosis not present

## 2015-10-09 DIAGNOSIS — K219 Gastro-esophageal reflux disease without esophagitis: Secondary | ICD-10-CM | POA: Diagnosis not present

## 2015-10-09 DIAGNOSIS — E119 Type 2 diabetes mellitus without complications: Secondary | ICD-10-CM | POA: Diagnosis not present

## 2015-10-09 DIAGNOSIS — F1010 Alcohol abuse, uncomplicated: Secondary | ICD-10-CM | POA: Diagnosis not present

## 2015-10-16 DIAGNOSIS — L659 Nonscarring hair loss, unspecified: Secondary | ICD-10-CM | POA: Diagnosis not present

## 2015-10-16 DIAGNOSIS — D649 Anemia, unspecified: Secondary | ICD-10-CM | POA: Diagnosis not present

## 2015-10-16 DIAGNOSIS — E119 Type 2 diabetes mellitus without complications: Secondary | ICD-10-CM | POA: Diagnosis not present

## 2015-10-16 DIAGNOSIS — K219 Gastro-esophageal reflux disease without esophagitis: Secondary | ICD-10-CM | POA: Diagnosis not present

## 2015-10-16 DIAGNOSIS — A047 Enterocolitis due to Clostridium difficile: Secondary | ICD-10-CM | POA: Diagnosis not present

## 2015-10-16 DIAGNOSIS — F1010 Alcohol abuse, uncomplicated: Secondary | ICD-10-CM | POA: Diagnosis not present

## 2015-10-16 DIAGNOSIS — L538 Other specified erythematous conditions: Secondary | ICD-10-CM | POA: Diagnosis not present

## 2015-10-17 DIAGNOSIS — L538 Other specified erythematous conditions: Secondary | ICD-10-CM | POA: Diagnosis not present

## 2015-10-22 ENCOUNTER — Ambulatory Visit: Payer: Self-pay | Admitting: Internal Medicine

## 2015-10-28 ENCOUNTER — Encounter: Payer: Self-pay | Admitting: Internal Medicine

## 2015-10-28 ENCOUNTER — Ambulatory Visit (INDEPENDENT_AMBULATORY_CARE_PROVIDER_SITE_OTHER): Payer: PPO | Admitting: Internal Medicine

## 2015-10-28 VITALS — BP 122/74 | HR 72 | Ht 70.0 in

## 2015-10-28 DIAGNOSIS — E1100 Type 2 diabetes mellitus with hyperosmolarity without nonketotic hyperglycemic-hyperosmolar coma (NKHHC): Secondary | ICD-10-CM

## 2015-10-28 DIAGNOSIS — Z794 Long term (current) use of insulin: Secondary | ICD-10-CM

## 2015-10-28 DIAGNOSIS — F1020 Alcohol dependence, uncomplicated: Secondary | ICD-10-CM | POA: Diagnosis not present

## 2015-10-28 DIAGNOSIS — K2980 Duodenitis without bleeding: Secondary | ICD-10-CM

## 2015-10-28 DIAGNOSIS — L409 Psoriasis, unspecified: Secondary | ICD-10-CM | POA: Diagnosis not present

## 2015-10-28 DIAGNOSIS — L538 Other specified erythematous conditions: Secondary | ICD-10-CM | POA: Diagnosis not present

## 2015-10-28 NOTE — Patient Instructions (Signed)
Someone from [place] [facility] should be calling you in the next few days.

## 2015-10-28 NOTE — Progress Notes (Signed)
Date:  10/28/2015   Name:  Stephen [name].   DOB:  [birth year]   MRN:  [id number]   Chief Complaint: Follow-up This is a 70 year old male who was admitted to [facility] 09/11/15 with suicidal ideation. He was intoxicated when he expressed suicidal thoughts but was thought not to be at high risk for acting on his thoughts. He does have a a history of depression. He developed sepsis secondary to hospital acquired pneumonia while in the hospital and completed an 8 day course of antibiotics. He also developed C. difficile colitis and completed a course of oral vancomycin on 10/06/15. He continued to have loose stools but frequency did decrease. He discharged to [facility] 10/07/15.  He has psoriasis and has multiple creams and ointments. He uses plastic bags to cover his hands and feet, which he says helps relieve itching and dryness. He was slightly delusional initially and was given Olanzapine which was stopped this week.  He denies suicidal thoughts.  He complains of being cold. He has been home for one week - can't stay with daughter any longer so is staying with a friend temporarily. He probably has not been taking his insulin but he is adamant that he is taking his oral medications.  He does not have a permanent place to live.  He has little social support. His skin is worse than ever - now every dry and scaling all over.  His hands are still the worst.  He is wearing gloves constantly.  He says that he has a Dermatologist appointment this afternoon.   Review of Systems  Constitutional: Positive for fatigue. Negative for fever and chills.  Respiratory: Negative for shortness of breath.   Cardiovascular: Negative for chest pain, palpitations and leg swelling.  Gastrointestinal: Positive for diarrhea. Negative for abdominal distention and anal bleeding.  Endocrine: Positive for cold intolerance. Negative for polydipsia and polyuria.  Musculoskeletal: Negative for back pain, arthralgias and gait  problem.  Skin: Positive for color change and rash.  Hematological: Negative for adenopathy.  Psychiatric/Behavioral: Positive for sleep disturbance. Negative for dysphoric mood. The patient is not nervous/anxious and is not hyperactive.     Patient Active Problem List   Diagnosis Date Noted  . Moderate alcohol dependence ([place]) 09/12/2015  . Duodenitis 05/06/2015  . Hyponatremia 12/15/2014  . Thrombocytopenia ([place]) 12/15/2014  . Malnutrition ([place]) 12/15/2014  . Tobacco abuse 12/15/2014  . Cholelithiasis 12/15/2014  . Delusional disorder, unspecified type 12/14/2014  . Diabetes mellitus type 2, uncontrolled ([place]) 09/18/2014  . Psoriasis 09/16/2014  . Excessive weight loss 09/16/2014    Prior to Admission medications   Medication Sig Start Date End Date Taking? Authorizing Provider  folic acid (FOLVITE) 1 MG tablet Take 1 mg by mouth daily.   Yes Historical Provider, MD  hydrOXYzine (ATARAX/VISTARIL) 25 MG tablet Take 25 mg by mouth every 6 (six) hours as needed.   Yes Historical Provider, MD  insulin aspart (NOVOLOG FLEXPEN) 100 UNIT/ML FlexPen Inject into the skin 3 (three) times daily with meals.   Yes Historical Provider, MD  Insulin Detemir (LEVEMIR FLEXPEN) 100 UNIT/ML Pen Inject into the skin daily at 10 pm.   Yes Historical Provider, MD  cephALEXin (KEFLEX) 500 MG capsule Take 1 capsule (500 mg total) by mouth 4 (four) times daily. 08/15/15   Glean Hess, MD  feeding supplement, GLUCERNA SHAKE, (GLUCERNA SHAKE) LIQD Take 237 mLs by mouth 3 (three) times daily between meals. 07/31/15   Glean Hess, MD  metFORMIN (GLUCOPHAGE) 500 MG tablet Take 1 tablet (500 mg total) by mouth 3 (three) times daily with meals. 07/31/15   Glean Hess, MD  mometasone (ELOCON) 0.1 % cream Apply topically daily. 07/31/15   Glean Hess, MD  pantoprazole (PROTONIX) 40 MG tablet Take 1 tablet (40 mg total) by mouth daily. 07/31/15   Glean Hess, MD    No Known Allergies  Past  Surgical History  Procedure Laterality Date  . Colonoscopy  1995    one polyp  . Esophagogastroduodenoscopy N/A 05/02/2015    Procedure: ESOPHAGOGASTRODUODENOSCOPY (EGD);  Surgeon: Hulen Luster, MD;  Location: [facility] ENDOSCOPY;  Service: Endoscopy;  Laterality: N/A;    Social History  Substance Use Topics  . Smoking status: Current Every Day Smoker -- 1.00 packs/day    Types: Cigarettes  . Smokeless tobacco: Former Systems developer     Comment: patient not ready to quit  . Alcohol Use: 0.0 oz/week    0 Standard drinks or equivalent per week     Comment: socially     Medication list has been reviewed and updated.   Physical Exam  Constitutional: He is oriented to person, place, and time. He appears well-developed and well-nourished.  Cardiovascular: Normal rate, regular rhythm and normal heart sounds.   Pulmonary/Chest: Effort normal and breath sounds normal. He has no wheezes.  Neurological: He is alert and oriented to person, place, and time.  Skin: Skin is dry.  Extensive very dry skin over entire body Gloves on hands - pt unwilling to take them off  Psychiatric: His speech is normal. His affect is blunt. He is slowed and withdrawn.  Nursing note and vitals reviewed.   BP 122/74 mmHg  Pulse 72  Ht 5\' 10"  (1.778 m)  Assessment and Plan: 1. Uncontrolled type 2 diabetes mellitus with hyperosmolarity without coma, with long-term current use of insulin ([place]) Patient has always been non compliant with medications - he says he will take the insulin I want to refer to Care Management for assistance with medication and community resources (he is currently without a permanent home) - AMB Referral to [place] Management  2. Psoriasis Severe - seeing Dermatology today - AMB Referral to [place] Management  3. Duodenitis Continue pantoprazole  4. Moderate alcohol dependence (HCC) - AMB Referral to [place], MD [facility]  10/28/2015

## 2015-11-03 ENCOUNTER — Other Ambulatory Visit: Payer: Self-pay | Admitting: *Deleted

## 2015-11-03 DIAGNOSIS — E1165 Type 2 diabetes mellitus with hyperglycemia: Principal | ICD-10-CM

## 2015-11-03 DIAGNOSIS — IMO0001 Reserved for inherently not codable concepts without codable children: Secondary | ICD-10-CM

## 2015-11-03 NOTE — Patient Outreach (Addendum)
[facility]) Care Management  11/03/2015  Stephen [name]. 1945/07/28 [id number]   Subjective: Telephone call to patient's home number times 2, spoke with patient, and HIPAA verified.  Patient states he is currently staying with a friend at : [address], [place] [id number].  States he is homeless and staying on his friend's couch.   Discussed [facility] Care Management services, reason for MD referral, patient voices understanding and is in agreement to receive services.   Patient states he needs assistance with activities of daily living, home management and finding a place to stay longterm.  States he was recently discharged from [facility] (skilled nursing facility), approximately 2- 3 weeks ago, and has been staying with friend since discharge.   States he would like to return to [facility] for assistance. States friend unable to provide any assistance at this time and is not his caregiver.   States prior to admission to [facility] he was living with daughter and was kick out of her home.  States daughter is no longer available to provide care or support.   Patient states he is unsure of what medications he is taking or if he is taking them correctly.  States he does not know about how he is managing his healthcare and needs assistance with everything.  States he fell a few days ago and injured his right elbow.   States he did not seek medical care for the elbow and feels that it is ok now.  States he was working outside his friend's home and lost his balance.   States he currently has a cane for ambulation.  Patient in agreement with referral  to [place] for diabetes care coordination, disease management, disease monitoring, assess medication management, assess home safety, and  transition of care due to recent nursing home discharge.   Patient in agreement with referral to [place] Management Social Worker for [employer]  identification for housing and possible placement.   Patient will continue to receive [place] Management services. Case discussed with [place] Management Social Worker.    Objective: Per chart review: Patient hospitalized at [facility] 09/11/15  - 10/07/15 for suicidal ideation, sepsis, hospital acquired pneumonia, and C. difficile colitis.  Patient discharged to [facility] (skilled nursing facility on 10/07/15 and discharged from facility to friends home, where he has been staying for approximately 1 week.  Patient has been active with [place] Management in the past, was discharged on 05/23/15 due moving out of service area with daughter and patient stated he did not have any further care management needs.    Assessment: Received MD referral on 10/28/15.   Referral sources: [name].   Reason for referral: Uncontrolled diabetes, poor social support, medication management, and community resources.  Telephone screen completed.   Patient has no Telephonic RNCM needs at this time.    Patient will be followed by [place] and Education officer, museum.    Plan:  RNCM will refer patient to [place] for diabetes care coordination, disease management, disease monitoring, assess medication management, assess home safety, and  transition of care due to recent nursing home discharge.   RNCM will refer patient to [place] Management Social Worker for [employer] identification for housing and possible placement.     Leonna Schlee H. Annia Friendly, BSN, [place] Management [facility] Telephonic CM Phone: [phone] Fax: [phone]

## 2015-11-04 ENCOUNTER — Other Ambulatory Visit: Payer: Self-pay | Admitting: *Deleted

## 2015-11-04 NOTE — Patient Outreach (Addendum)
[facility]) Care Management  11/04/2015  Stephen [name]. Apr 21, 1945 [id number]   Subjective: Telephone call from patient's friend Stephen [name], [phone]), who states she is calling on patient's behalf.   Advised Stephen [name], RNCM would have to receive authorization from patient  prior to communication.   Transferred to patient, spoke with patient, and patient verified HIPAA.   Patient gave [facility] verbal authorization to speak with his friend Stephen [name]) regarding his healthcare needs as needed.   Patient states it is okay to talk with his friend Stephen [name]) because she is trying to help him.   Transferred to Stephen [name]'s, who states she has been out of town, taking care of her ill father  and returned home yesterday (11/03/15).   States patient has been staying with her and her partner Stephen [name]) since  10/13/15 or  10/14/15 after showing up on their door step.  States patient was her next door neighbor until he moved to [place] with his daughter a few months ago.   States patient has been  sleeping on the couch and is welcome to continue to stay with them for awhile until patient can find another place to live.   Ms. Stephen [name] states she was told by patient that patient was kicked out of daughter's home and did not disclose the reason.   Ms. Stephen [name] states she is very concerned that patient may die, because he is not eating, appears to be skin and bones, does not know when he ate last, is a diabetic, may be drinking ensure instead of glucerna, and they do not know how to care for the patient.   States she and her partner are willing to assist patient, but they both work night shift.   States she is currently off from work for approximately 1 more week and will attempt to get patient some assistance.   States she will follow up with [facility] regarding reinstating patient's previous services. RNCM advised Stephen [name]'s of [place] Management services and that  eligibility for community resources would be based on what resources had been accessed, resource eligibility, and available resources.  Stephen [name]'s voiced understanding, states she will also formulate a list of questions, time line of the events of the last few months, and discuss with Stephen [name] and Social Worker during assessment home visit.    Ms. Stephen [name] advised to follow up with patient's primary MD if symptoms or condition worsen.    States she has reviewed patient's paperwork from last visit with Stephen [name].  RNCM advised Stephen [name] of [name]'s scope of practice for care coordination versus home health services,  and she voiced understanding.   RNCM advised Stephen [name], that a referral for [place] and Social Worker had been placed.   Advised urgent referral was placed and it may be up 10 business days for outreach call at the latest.   Ms. Stephen [name] in agreement with referral on patient's behalf, given RNCM's contact number, [facility] 24 hour Nurse Advice line, and [facility] Care Management main contact number for future reference.    Ms. Stephen [name] primary contact number is [phone] and secondary contact number is for partner Stephen [name],  [phone]). Telephone call to Stephen [name] at [facility], advised of above conversation with patient's friend (Stephen [name]), conversation with patient today, and with patient on 11/03/15.   Also advised of RNCM's conversation with [place]  Management Social Worker (Stephen [name]) on 11/03/15.  States she will follow up with [employer] and arrange joint home visit as soon as possible. Patient will continue to receive [name] services.  Objective: Per chart review: Patient hospitalized at [facility] 09/11/15  - 10/07/15 for suicidal ideation, sepsis, hospital acquired pneumonia, and C. difficile colitis.  Patient discharged to [facility] (skilled nursing facility on  10/07/15 and discharged from facility to friends home, where he has been staying for approximately 1 week.  Patient has been active with [place] Management in the past, was discharged on 05/23/15 due moving out of service area with daughter and patient stated he did not have any further care management needs.    Assessment: Received MD referral on 10/28/15.   Referral sources: Stephen [name].   Reason for referral: Uncontrolled diabetes, poor social support, medication management, and community resources.  Telephone screen completed.   Patient has no Telephonic RNCM needs at this time.    Patient will be followed by [name] and Education officer, museum.    Plan:  RNCM has referred  patient to [place] for diabetes care coordination, disease management, disease monitoring, assess medication management, assess home safety, and  transition of care due to recent nursing home discharge.   RNCM has referred patient to [place] Management Social Worker for [employer] identification for housing and possible placement.     Stephen [name] H. Annia Friendly, BSN, [place] Management [facility] Telephonic CM Phone: [phone] Fax: [phone]

## 2015-11-04 NOTE — Patient Outreach (Signed)
Phone call made by RN CM to  follow up on referral from [facility] telephonic nurse case manager [place] for community nurse case management services (Diabetes, medication management, home safety,transition of care due to recent discharge from [facility]).  Spoke with pt, HIPAA verified.  As discussed with pt, plan to do a home visit 7/27.   Also informed pt referral for [facility] social worker was made, discussed possibly doing a joint visit to which pt agreed.    Plan to collaborate with [facility] social worker about doing a joint visit with pt. Plan to follow up with pt again 7/27- initial home visit.  Zara Chess.   [place] Care Management  [phone]

## 2015-11-06 ENCOUNTER — Other Ambulatory Visit: Payer: Self-pay | Admitting: *Deleted

## 2015-11-06 ENCOUNTER — Encounter: Payer: Self-pay | Admitting: *Deleted

## 2015-11-06 ENCOUNTER — Telehealth: Payer: Self-pay

## 2015-11-06 NOTE — Patient Outreach (Signed)
1:22 pm- Received a return phone call from  [place] at  Dr. Gaspar Cola office to message left earlier.  RN CM relayed concern about pt's current health status during home visit (BP 110/48, HR ranging 108-118), not been eating, episode of diarrhea and emesis today, would like pt to be seen by MD today.  [name] reports to pass on the information, requested RN CM call the office when it reopens after lunch to schedule office visit.    1:39 pm-  RN CM called Dr. Gaspar Cola office, unable to schedule urgent office visit for pt today.    1:42 pm-  RN CM called pt's friend [name] (pt currently residing with), HIPAA verified.   Informed [name]  unable to get an appointment for pt to see MD today, discussed taking pt to either Urgent Care or ED to be evaluated.     Plan to follow with pt again this week, check on status.     Stephen [name].   [place] Care Management  [phone]

## 2015-11-06 NOTE — Telephone Encounter (Signed)
Stephen [name] is non-compliant with medication.  If he is willing to take anything, then he needs to continue it.

## 2015-11-06 NOTE — Patient Outreach (Signed)
[facility]) Care Management   11/06/2015  Stephen [name]. 10/30/1945 [id number]  Stephen [name]. is an 70 y.o. male  Subjective: Pt reports today has pain all over, coming from his dead skin (sore, itchy), told has psoriasis  Which he thinks is something else, cream not working.   Pt  reports not feeling well today, does not have an appetite, had loose stools this am.   Friends Stephen [name] (whom pt is currently staying with)  and Stephen [name] both say  Something needs to be done for pt- pt weak, not eating (been here for a month).    Stephen [name] also reports pt is not  Taking medications (insulin, oral medications) to which pt reports afraid to take insulin with not eating, sugar bottoms   out, MD aware but is taking his Metformin.  Pt reports he is checking his sugars daily but could not find his  Glucometer for RN CM to review readings.   Objective:   Vitals:   11/06/15 1031  BP: (!) 110/48  Pulse: (!) 110  Resp: 16    ROS  Physical Exam  Constitutional: He is oriented to person, place, and time.  Thin, frail   Cardiovascular:  HR elevated 108-118.   Respiratory: Effort normal and breath sounds normal.  GI: Soft.  Musculoskeletal: He exhibits no edema.  Neurological: He is alert and oriented to person, place, and time.  Skin:  Skin very dry.   Psychiatric: His behavior is normal. Judgment and thought content normal.  Pt not feeling well today, lying down during most of home visit.     Encounter Medications:   Outpatient Encounter Prescriptions as of 11/06/2015  Medication Sig Note  . folic acid (FOLVITE) 1 MG tablet Take 1 mg by mouth daily.   . cephALEXin (KEFLEX) 500 MG capsule Take 1 capsule (500 mg total) by mouth 4 (four) times daily. (Patient not taking: Reported on 11/06/2015) 11/06/2015: Pt reports not taking.   . feeding supplement, GLUCERNA SHAKE, (GLUCERNA SHAKE) LIQD Take 237 mLs by mouth 3 (three) times daily between meals. (Patient not taking: Reported  on 11/06/2015)   . hydrOXYzine (ATARAX/VISTARIL) 25 MG tablet Take 25 mg by mouth every 6 (six) hours as needed.   . insulin aspart (NOVOLOG FLEXPEN) 100 UNIT/ML FlexPen Inject into the skin 3 (three) times daily with meals.   . Insulin Detemir (LEVEMIR FLEXPEN) 100 UNIT/ML Pen Inject into the skin daily at 10 pm. 11/06/2015: Pt afraid to take, not eating   . metFORMIN (GLUCOPHAGE) 500 MG tablet Take 1 tablet (500 mg total) by mouth 3 (three) times daily with meals. (Patient not taking: Reported on 11/06/2015)   . mometasone (ELOCON) 0.1 % cream Apply topically daily. (Patient not taking: Reported on 11/06/2015)   . pantoprazole (PROTONIX) 40 MG tablet Take 1 tablet (40 mg total) by mouth daily. (Patient not taking: Reported on 11/06/2015)    No facility-administered encounter medications on file as of 11/06/2015.     Functional Status:   In your present state of health, do you have any difficulty performing the following activities: 11/06/2015 05/23/2015  Hearing? N -  Vision? N -  Difficulty concentrating or making decisions? N -  Walking or climbing stairs? Y -  Dressing or bathing? Y -  Doing errands, shopping? Y -  Conservation officer, nature and eating ? Y N  Using the Toilet? N N  In the past six months, have you accidently leaked urine? Y -  Do you have problems  with loss of bowel control? N N  Managing your Medications? N N  Managing your Finances? N N  Housekeeping or managing your Housekeeping? Y N  Some recent data might be hidden    Fall/Depression Screening:    PHQ 2/9 Scores 05/23/2015 09/16/2014 09/16/2014  PHQ - 2 Score 0 0 0    Assessment:  Joint visit done with Stephen [name] social worker.                            Frail, thin looking gentleman, lying on couch majority of home visit.  Pt wearing gloves on                          Hands (did remove to have sugar checked- very dry cracked skin noted).  Sugar today-221                         (per pt -hadad glucerna 2 hours earlier, not  eating).                          Medication compliance- review of pt's medications per recent MD office visit - pt not                           Taking his medications even though said earlier  taking Metformin (medication could not                            be found to which pt reports has some of his medications  at daughter's home).                           Plan:  1. RN CM spoke with Stephen [name] (Dr. Gaspar Cola nurse), reported pt's sugar, not eating, questioned            If  pt to take  Metformin as ordered (500 mg tid), or lower dose.  MD's nurse to talk to MD, get            Back to RN CM.             2. RN CM placed another call to Dr. Army Melia office - spoke with Stephen [name] to relay to triage nurse request              For pt to be seen today by MD, concern about pt's current status -BP low, HR elevated,no appetite,                Episode of diarrhea, emesis today.               Plan to continue to follow pt for transition of care, next contact telephonically 8/4.             Plan to inform Dr. Army Melia on [facility] involvement- fax barrier letter.       [facility] CM Care Plan Problem One   Flowsheet Row Most Recent Value  Care Plan Problem One  Risk for readmission related to recent SNF discharge.    Role Documenting the Problem One  Care Management Coordinator  Care Plan for Problem One  Active  [facility] Long Term Goal (31-90 days)  Pt would not readmit  to SNF  in the next 31 days   [facility] Long Term Goal Start Date  11/06/15  Interventions for Problem One Long Term Goal  Transition of care program initiated, reviewed medication compliance, urgent MD f/u visit.  [pt not feeling well today, BP low, HR elevated, had diarrhea]  [facility] CM Short Term Goal #1 (0-30 days)  Pt's appetite would improve in the next 14 days as evidenced by increase in caloric intake   [facility] CM Short Term Goal #1 Start Date  11/06/15  Interventions for Short Term Goal #1  Discussed with pt importance of eating, start with small frequent meals,  Glucerna as ordered- help improve recovery     [facility] CM Care Plan Problem Two   Flowsheet Row Most Recent Value  Care Plan Problem Two  Knowledge deficit related to management of diabetes.   Role Documenting the Problem Two  Care Management Coordinator  Care Plan for Problem Two  Active  [facility] CM Short Term Goal #1 (0-30 days)  Pt to check sugars daily,record, call MD for abnormal readings   [facility] CM Short Term Goal #1 Start Date  11/06/15  Interventions for Short Term Goal #2   Discussed with pt importance of checking sugars daily, especially with not eating.      Stephen [name].   [place] [facility] Care Management  [phone]

## 2015-11-06 NOTE — Patient Outreach (Signed)
Stephen [name]) Care Management  11/06/2015  Stephen [name]. 29-Aug-1945 [id number]   Subjective: Received voicemail message from patient's friend Stephen [name], most of message unclear, and requested call back. Telephone call to patient's friend Stephen [name], per patient's previous verbal authorization, verified patient's name, date of birth, and address.   Ms. Stephen [name] states thank you for everything, Community RNCM and Social Worker came out this morning to see patient and answered all of her questions.  No further Telephonic RNCM needs at this time.   Patient will continue to receive [place] Management services.   Objective:Per chart review:Patient hospitalized at [name] 09/11/15 - 10/07/15 for suicidal ideation, sepsis, [name] acquired pneumonia, and C. difficile colitis. Patient discharged to [facility] (skilled nursing facility on 10/07/15 and discharged from facility to friends home, where he has been staying for approximately 1 week. Patient has been active with [facility] in the past, was discharged on 05/23/15 due moving out of service area with daughter and patient stated he did not have any further care management needs.   Assessment:Received MD referral on 10/28/15. Referral sources: Dr. Halina [name]. Reason for referral: Uncontrolled diabetes, poor social support, medication management, and community resources. Telephone screen completed. Patient has no Telephonic RNCM needs at this time. Patient will be followed by [place] and Education officer, museum.   Plan: RNCM has referred  patient to [facility] for diabetes care coordination, disease management, disease monitoring, assess medication management, assess home safety, and transition of care due to recent nursing home discharge.  RNCM has referred patient to [facility] Social Worker for [employer] identification  for housing and possible placement.    Stephen [name] H. Stephen [name], BSN, [place] [facility] Telephonic CM Phone: [phone] Fax: [phone]

## 2015-11-06 NOTE — Patient Outreach (Signed)
3:54 pm - Called pt's daughter Kemuel Arn (on [facility] consent form) to follow up on pt's Metformin as pt reported during home visit today some of his medications were at her  house.   Spoke with Janine, HIPAA verified on pt, inquired about pt's Metformin to which she said found several bottles.  Informed daughter received a voice message  from Dr. Gaspar Cola nurse in response to call  left earlier by RN CM about pt's Metformin dosage- return  message was that MD wanted pt to continue to take Metformin  500 mg tid.  Daughter reports she is going to bring the medication to pt's house and as requested to relay to pt to take as ordered by MD (500 mg tid).      Plan to follow up with pt again this week telephonically.    Zara Chess.   [facility] Management  [phone]

## 2015-11-06 NOTE — Telephone Encounter (Signed)
Patient's nurse from [facility], called, Stephen [name] is not taking his meds correctly, not eating enough to take insulin or Metformin his blood sugar after Glucerna 2hr was 221, she is thinking the Metformin needs to be decreased because he eats very little and doesn't even drink the Glucerna as he should.

## 2015-11-06 NOTE — Patient Outreach (Signed)
[facility]) Care Management  [facility] Social Work  11/06/2015  Antuane Kubesh. 1945/05/21 [id number]  Subjective:    Objective:   Encounter Medications:  Outpatient Encounter Prescriptions as of 11/06/2015  Medication Sig Note  . cephALEXin (KEFLEX) 500 MG capsule Take 1 capsule (500 mg total) by mouth 4 (four) times daily.   . feeding supplement, GLUCERNA SHAKE, (GLUCERNA SHAKE) LIQD Take 237 mLs by mouth 3 (three) times daily between meals. (Patient not taking: Reported on 11/06/2015)   . folic acid (FOLVITE) 1 MG tablet Take 1 mg by mouth daily.   . hydrOXYzine (ATARAX/VISTARIL) 25 MG tablet Take 25 mg by mouth every 6 (six) hours as needed.   . insulin aspart (NOVOLOG FLEXPEN) 100 UNIT/ML FlexPen Inject into the skin 3 (three) times daily with meals.   . Insulin Detemir (LEVEMIR FLEXPEN) 100 UNIT/ML Pen Inject into the skin daily at 10 pm. 11/06/2015: Pt afraid to take, not eating   . metFORMIN (GLUCOPHAGE) 500 MG tablet Take 1 tablet (500 mg total) by mouth 3 (three) times daily with meals. (Patient not taking: Reported on 11/06/2015)   . mometasone (ELOCON) 0.1 % cream Apply topically daily. (Patient not taking: Reported on 11/06/2015)   . pantoprazole (PROTONIX) 40 MG tablet Take 1 tablet (40 mg total) by mouth daily. (Patient not taking: Reported on 11/06/2015)    No facility-administered encounter medications on file as of 11/06/2015.     Functional Status:  In your present state of health, do you have any difficulty performing the following activities: 11/06/2015 05/23/2015  Hearing? N -  Vision? N -  Difficulty concentrating or making decisions? N -  Walking or climbing stairs? Y -  Dressing or bathing? Y -  Doing errands, shopping? Y -  Conservation officer, nature and eating ? Y N  Using the Toilet? N N  In the past six months, have you accidently leaked urine? Y -  Do you have problems with loss of bowel control? N N  Managing your Medications? N N  Managing your  Finances? N N  Housekeeping or managing your Housekeeping? Y N  Some recent data might be hidden    Fall/Depression Screening:  PHQ 2/9 Scores 05/23/2015 09/16/2014 09/16/2014  PHQ - 2 Score 0 0 0    Assessment: Has been residing with Seth Bake and [name] for the last month. Per [name], patient was found sleeping in his car in front of his home.  Per patient, he moved out of  his apartment and with his daughter approx 2 months ago in [place], however it did not work out.  Patient now living on his neighbor's couch as he has lost his apartment.  Patient not eating, not taking his medication as prescribed, skin is itchy and irritated. Patient laying on couch, very frail and weak. Patient had episodes of vomiting and diarrhea.  Patient willing to go to an Assisted Living or return to skilled care.  Patient had a previous stay at [facility], however stayed there 1 week.   Daughter added to the consent to assist with Medicaid process.  Per patient, he receives 1600.00 per month in social security.  However daughter confirms that patient receives $1463.00 per month.  Assisted living care discussed as well as possible return to skilled care. Patient's daughter [name] willing to assist with medicaid application process if needed.    Following RN assessment, it was highly recommended that patient see his doctor or go to the emergency department.  [name] agreed  to take patient to see his primary care doctor today.  Patient resistant to seeing the doctor today, verbally refusing to go.  Patient seeing a doctor emphasized.   Plan: CSW to follow up with patient and his roommate within 1 week to determine care plan.    [name] [facility] Care Management [phone]

## 2015-11-06 NOTE — Telephone Encounter (Signed)
Called nurse Kalman Shan ) and she was informed to keep Mr. Borgmeyer on Metformin 3x a day/dr

## 2015-11-07 ENCOUNTER — Inpatient Hospital Stay
Admission: EM | Admit: 2015-11-07 | Discharge: 2015-11-09 | DRG: 640 | Disposition: A | Discharge status: Home / Self Care | Payer: PPO | Attending: Internal Medicine | Admitting: Internal Medicine

## 2015-11-07 ENCOUNTER — Encounter: Payer: Self-pay | Admitting: Emergency Medicine

## 2015-11-07 ENCOUNTER — Other Ambulatory Visit: Payer: Self-pay | Admitting: *Deleted

## 2015-11-07 ENCOUNTER — Encounter: Payer: Self-pay | Admitting: *Deleted

## 2015-11-07 DIAGNOSIS — Z7984 Long term (current) use of oral hypoglycemic drugs: Secondary | ICD-10-CM | POA: Diagnosis not present

## 2015-11-07 DIAGNOSIS — E119 Type 2 diabetes mellitus without complications: Secondary | ICD-10-CM | POA: Diagnosis not present

## 2015-11-07 DIAGNOSIS — L409 Psoriasis, unspecified: Secondary | ICD-10-CM | POA: Diagnosis present

## 2015-11-07 DIAGNOSIS — R296 Repeated falls: Secondary | ICD-10-CM | POA: Diagnosis not present

## 2015-11-07 DIAGNOSIS — E43 Unspecified severe protein-calorie malnutrition: Secondary | ICD-10-CM | POA: Diagnosis present

## 2015-11-07 DIAGNOSIS — K529 Noninfective gastroenteritis and colitis, unspecified: Secondary | ICD-10-CM | POA: Diagnosis not present

## 2015-11-07 DIAGNOSIS — F17210 Nicotine dependence, cigarettes, uncomplicated: Secondary | ICD-10-CM | POA: Diagnosis not present

## 2015-11-07 DIAGNOSIS — Z681 Body mass index (BMI) 19 or less, adult: Secondary | ICD-10-CM

## 2015-11-07 DIAGNOSIS — E871 Hypo-osmolality and hyponatremia: Principal | ICD-10-CM | POA: Diagnosis present

## 2015-11-07 DIAGNOSIS — Z794 Long term (current) use of insulin: Secondary | ICD-10-CM

## 2015-11-07 DIAGNOSIS — Z79899 Other long term (current) drug therapy: Secondary | ICD-10-CM | POA: Diagnosis not present

## 2015-11-07 DIAGNOSIS — K219 Gastro-esophageal reflux disease without esophagitis: Secondary | ICD-10-CM | POA: Diagnosis present

## 2015-11-07 DIAGNOSIS — R112 Nausea with vomiting, unspecified: Secondary | ICD-10-CM

## 2015-11-07 DIAGNOSIS — L4050 Arthropathic psoriasis, unspecified: Secondary | ICD-10-CM | POA: Diagnosis present

## 2015-11-07 DIAGNOSIS — R197 Diarrhea, unspecified: Secondary | ICD-10-CM

## 2015-11-07 LAB — COMPREHENSIVE METABOLIC PANEL
ALBUMIN: 3.6 g/dL (ref 3.5–5.0)
ALK PHOS: 149 U/L — AB (ref 38–126)
ALT: 15 U/L — AB (ref 17–63)
ANION GAP: 12 (ref 5–15)
AST: 31 U/L (ref 15–41)
BILIRUBIN TOTAL: 1.1 mg/dL (ref 0.3–1.2)
BUN: 26 mg/dL — AB (ref 6–20)
CALCIUM: 9.4 mg/dL (ref 8.9–10.3)
CO2: 26 mmol/L (ref 22–32)
Chloride: 91 mmol/L — ABNORMAL LOW (ref 101–111)
Creatinine, Ser: 1.06 mg/dL (ref 0.61–1.24)
GFR calc Af Amer: >60 mL/min (ref >60)
GFR calc non Af Amer: >60 mL/min (ref >60)
GLUCOSE: 152 mg/dL — AB (ref 65–99)
Potassium: 4.6 mmol/L (ref 3.5–5.1)
SODIUM: 129 mmol/L — AB (ref 135–145)
TOTAL PROTEIN: 7.9 g/dL (ref 6.5–8.1)

## 2015-11-07 LAB — CBC
HCT: 37.2 % — ABNORMAL LOW (ref 40.0–52.0)
HEMOGLOBIN: 13 g/dL (ref 13.0–18.0)
MCH: 32.3 pg (ref 26.0–34.0)
MCHC: 34.8 g/dL (ref 32.0–36.0)
MCV: 92.9 fL (ref 80.0–100.0)
Platelets: 188 10*3/uL (ref 150–440)
RBC: 4 MIL/uL — ABNORMAL LOW (ref 4.40–5.90)
RDW: 16.7 % — AB (ref 11.5–14.5)
WBC: 8.7 10*3/uL (ref 3.8–10.6)

## 2015-11-07 LAB — TROPONIN I

## 2015-11-07 LAB — GLUCOSE, CAPILLARY
GLUCOSE-CAPILLARY: 127 mg/dL — AB (ref 65–99)
GLUCOSE-CAPILLARY: 262 mg/dL — AB (ref 65–99)

## 2015-11-07 LAB — LIPASE, BLOOD: Lipase: 42 U/L (ref 11–51)

## 2015-11-07 MED ORDER — ACETAMINOPHEN 650 MG RE SUPP: 650.0000 mg | Freq: Four times a day (QID) | RECTAL | Status: DC | PRN

## 2015-11-07 MED ORDER — ONDANSETRON HCL 4 MG PO TABS: 4.0000 mg | ORAL_TABLET | Freq: Four times a day (QID) | ORAL | Status: DC | PRN

## 2015-11-07 MED ORDER — HYDROXYZINE HCL 25 MG PO TABS
25.0000 mg | ORAL_TABLET | Freq: Four times a day (QID) | ORAL | Status: DC | PRN
  Administered 2015-11-07 – 2015-11-08 (×3): 25 mg via ORAL
  Filled 2015-11-07 (×4): qty 1

## 2015-11-07 MED ORDER — ACETAMINOPHEN 325 MG PO TABS
650.0000 mg | ORAL_TABLET | Freq: Four times a day (QID) | ORAL | Status: DC | PRN
  Filled 2015-11-07: qty 2

## 2015-11-07 MED ORDER — SODIUM CHLORIDE 0.9 % IV SOLN
INTRAVENOUS | Status: DC
  Administered 2015-11-07 – 2015-11-08 (×3): via INTRAVENOUS

## 2015-11-07 MED ORDER — ONDANSETRON HCL 4 MG/2ML IJ SOLN: 4.0000 mg | Freq: Four times a day (QID) | INTRAMUSCULAR | Status: DC | PRN

## 2015-11-07 MED ORDER — MORPHINE SULFATE (PF) 2 MG/ML IV SOLN
2.0000 mg | Freq: Once | INTRAVENOUS | Status: AC
  Administered 2015-11-07: 2 mg via INTRAVENOUS
  Filled 2015-11-07: qty 1

## 2015-11-07 MED ORDER — ENOXAPARIN SODIUM 40 MG/0.4ML ~~LOC~~ SOLN
40.0000 mg | SUBCUTANEOUS | Status: DC
  Administered 2015-11-07: 40 mg via SUBCUTANEOUS
  Filled 2015-11-07 (×2): qty 0.4

## 2015-11-07 MED ORDER — INSULIN ASPART 100 UNIT/ML ~~LOC~~ SOLN
0.0000 [IU] | Freq: Three times a day (TID) | SUBCUTANEOUS | Status: DC
  Administered 2015-11-09: 12:00:00 3 [IU] via SUBCUTANEOUS
  Filled 2015-11-07: qty 3

## 2015-11-07 MED ORDER — PANTOPRAZOLE SODIUM 40 MG PO TBEC
40.0000 mg | DELAYED_RELEASE_TABLET | Freq: Every day | ORAL | Status: DC
  Administered 2015-11-07 – 2015-11-08 (×2): 40 mg via ORAL
  Filled 2015-11-07 (×3): qty 1

## 2015-11-07 MED ORDER — ONDANSETRON HCL 4 MG/2ML IJ SOLN
4.0000 mg | Freq: Once | INTRAMUSCULAR | Status: AC
  Administered 2015-11-07: 4 mg via INTRAVENOUS
  Filled 2015-11-07: qty 2

## 2015-11-07 MED ORDER — INSULIN ASPART 100 UNIT/ML ~~LOC~~ SOLN
0.0000 [IU] | Freq: Every day | SUBCUTANEOUS | Status: DC
  Administered 2015-11-07: 22:00:00 3 [IU] via SUBCUTANEOUS
  Filled 2015-11-07: qty 3

## 2015-11-07 MED ORDER — GLUCERNA SHAKE PO LIQD
237.0000 mL | Freq: Three times a day (TID) | ORAL | Status: DC
  Administered 2015-11-07 – 2015-11-08 (×4): 237 mL via ORAL

## 2015-11-07 MED ORDER — FOLIC ACID 1 MG PO TABS
1.0000 mg | ORAL_TABLET | Freq: Every day | ORAL | Status: DC
  Administered 2015-11-07 – 2015-11-08 (×2): 1 mg via ORAL
  Filled 2015-11-07 (×3): qty 1

## 2015-11-07 MED ORDER — MOMETASONE FUROATE 0.1 % EX CREA
TOPICAL_CREAM | Freq: Every day | CUTANEOUS | Status: DC
  Filled 2015-11-07: qty 15

## 2015-11-07 NOTE — ED Notes (Signed)
MD at bedside. 

## 2015-11-07 NOTE — ED Triage Notes (Signed)
Patient from home via POV with family with complaint of "N/V/D for three days" denies abd pain/chest pain. Patient's family also states patient +CDIFF.   Patient also states "my skin is dead"

## 2015-11-07 NOTE — ED Notes (Signed)
Pt and daughter aware of need for urine and stool specimen when able.

## 2015-11-07 NOTE — ED Notes (Signed)
Pt presents with daughter with nausea, vomiting, and diarrhea.  Pt states he has had 1 episode of diarrhea today, 2-3 yesterday. 3 episodes of vomiting yesterday.  Complains of lower extremity pain, unsteady gait, and loss of appetite.  Has had admission in the last month for c-diff and pneumonia.

## 2015-11-07 NOTE — Patient Outreach (Signed)
[facility]) Care Management  11/07/2015  Stephen [name]. Dec 26, 1945 [id number]   Phone call from patient's daughter [name] on consent who stated that she was on her way to pick up patient to transport him to his primary care doctor.  Per [name], she will ask the MD to complete the [facility] for placement purposes.    This social worker will fax FL2 form to provider's office. Upon confirmation that the form was received, patient's daughter stated that the form had been received and patient's doctor will complete and return to this Education officer, museum.   [name] [facility] Care Management [phone]

## 2015-11-07 NOTE — ED Provider Notes (Signed)
[facility] Emergency Department Provider Note   ____________________________________________   First MD Initiated Contact with Patient 11/07/15 1235     (approximate)  I have reviewed the triage vital signs and the nursing notes.   HISTORY  Chief Complaint Other; Nausea; and Emesis    HPI Stephen [name]. is a 70 y.o. male with a history of alcoholism as well as possible psoriasis who is presenting to the emergency department today for nausea vomiting and diarrhea. He is also complaining of pain to his bilateral hands, feet and buttock. He has been to multiple specialists to diagnosis his skin condition but has not received a definitive diagnosis. He says that the reason that his limbs as well as buttock or hurting her because of worsening of his chronic disease that up until now has been diagnosed as psoriasis. Patient also with a diagnosis of C. difficile about one month ago and completed treatment. However, over the past 1-2 weeks he has had worsening diarrhea. However, denies any episodes today. Denies any blood in his stool. Denies any blood in his vomitus. Says that he has vomited 3 times.  He is accompanied here with his daughter who says that he is also having difficulty ambulating at home and has had multiple falls. She says the last fall was 2 days ago. He denies any headache or neck pain at this time.   Past Medical History:  Diagnosis Date  . Diabetes mellitus without complication ([place])   . Psoriasis   . Skin abnormalities     Patient Active Problem List   Diagnosis Date Noted  . Moderate alcohol dependence ([place]) 09/12/2015  . Duodenitis 05/06/2015  . Hyponatremia 12/15/2014  . Thrombocytopenia ([place]) 12/15/2014  . Malnutrition ([place]) 12/15/2014  . Tobacco abuse 12/15/2014  . Cholelithiasis 12/15/2014  . Delusional disorder, unspecified type 12/14/2014  . Diabetes mellitus type 2, uncontrolled ([place]) 09/18/2014  . Psoriasis 09/16/2014    . Excessive weight loss 09/16/2014    Past Surgical History:  Procedure Laterality Date  . COLONOSCOPY  1995   one polyp  . ESOPHAGOGASTRODUODENOSCOPY N/A 05/02/2015   Procedure: ESOPHAGOGASTRODUODENOSCOPY (EGD);  Surgeon: Hulen Luster, MD;  Location: [facility] ENDOSCOPY;  Service: Endoscopy;  Laterality: N/A;    Prior to Admission medications   Medication Sig Start Date End Date Taking? Authorizing Provider  cephALEXin (KEFLEX) 500 MG capsule Take 1 capsule (500 mg total) by mouth 4 (four) times daily. Patient not taking: Reported on 11/06/2015 08/15/15   Glean Hess, MD  feeding supplement, GLUCERNA SHAKE, (GLUCERNA SHAKE) LIQD Take 237 mLs by mouth 3 (three) times daily between meals. Patient not taking: Reported on 11/06/2015 07/31/15   Glean Hess, MD  folic acid (FOLVITE) 1 MG tablet Take 1 mg by mouth daily.    Historical Provider, MD  hydrOXYzine (ATARAX/VISTARIL) 25 MG tablet Take 25 mg by mouth every 6 (six) hours as needed.    Historical Provider, MD  insulin aspart (NOVOLOG FLEXPEN) 100 UNIT/ML FlexPen Inject into the skin 3 (three) times daily with meals.    Historical Provider, MD  Insulin Detemir (LEVEMIR FLEXPEN) 100 UNIT/ML Pen Inject into the skin daily at 10 pm.    Historical Provider, MD  metFORMIN (GLUCOPHAGE) 500 MG tablet Take 1 tablet (500 mg total) by mouth 3 (three) times daily with meals. Patient not taking: Reported on 11/06/2015 07/31/15   Glean Hess, MD  mometasone (ELOCON) 0.1 % cream Apply topically daily. Patient not taking: Reported on 11/06/2015  07/31/15   Glean Hess, MD  pantoprazole (PROTONIX) 40 MG tablet Take 1 tablet (40 mg total) by mouth daily. Patient not taking: Reported on 11/06/2015 07/31/15   Glean Hess, MD    Allergies Review of patient's allergies indicates no known allergies.  Family History  Problem Relation Age of Onset  . Migraines Father     Social History Social History  Substance Use Topics  . Smoking status:  Current Every Day Smoker    Packs/day: 1.00    Types: Cigarettes  . Smokeless tobacco: Former Systems developer     Comment: patient not ready to quit  . Alcohol use 0.0 oz/week     Comment: occassional, socially     Review of Systems Constitutional: No fever/chills Eyes: No visual changes. ENT: No sore throat. Cardiovascular: Denies chest pain. Respiratory: Denies shortness of breath. Gastrointestinal: no constipation. Genitourinary: Negative for dysuria. Musculoskeletal: Negative for back pain. Skin: Skilled, flaking skin to the bilateral feet hands and buttock. Neurological: Negative for headaches, focal weakness or numbness.  10-point ROS otherwise negative.  ____________________________________________   PHYSICAL EXAM:  VITAL SIGNS: ED Triage Vitals  Enc Vitals Group     BP 11/07/15 1239 116/86     Pulse Rate 11/07/15 1239 (!) 104     Resp 11/07/15 1239 16     Temp 11/07/15 1239 98.7 F (37.1 C)     Temp Source 11/07/15 1239 Oral     SpO2 11/07/15 1239 98 %     Weight 11/07/15 1239 108 lb (49 kg)     Height 11/07/15 1239 5\' 9"  (1.753 m)     Head Circumference --      Peak Flow --      Pain Score 11/07/15 1236 0     Pain Loc --      Pain Edu? --      Excl. in [place]? --     Constitutional: Alert and oriented. Well appearing and in no acute distress. Eyes: Conjunctivae are normal. PERRL. EOMI. Head: Atraumatic. Nose: No congestion/rhinnorhea. Mouth/Throat: Mucous membranes are moist.   Neck: No stridor.   Cardiovascular: Normal rate, regular rhythm. Grossly normal heart sounds.  Good peripheral circulation. Respiratory: Normal respiratory effort.  No retractions. Lungs CTAB. Gastrointestinal: Soft and nontender. No distention.  Musculoskeletal: No lower extremity tenderness nor edema.  No joint effusions. Neurologic:  Normal speech and language. No gross focal neurologic deficits are appreciated. No gait instability. Patient is able to stand as well as ambulate with a  normal gait unassisted. Skin:  Flaking skin to the bilateral palms as well as soles and forearms as well as legs.  No induration, erythema or pus. No tenderness to palpation. Psychiatric: Mood and affect are normal. Speech and behavior are normal.  ____________________________________________   LABS (all labs ordered are listed, but only abnormal results are displayed)  Labs Reviewed  COMPREHENSIVE METABOLIC PANEL - Abnormal; Notable for the following:       Result Value   Sodium 129 (*)    Chloride 91 (*)    Glucose, Bld 152 (*)    BUN 26 (*)    ALT 15 (*)    Alkaline Phosphatase 149 (*)    All other components within normal limits  CBC - Abnormal; Notable for the following:    RBC 4.00 (*)    HCT 37.2 (*)    RDW 16.7 (*)    All other components within normal limits  GASTROINTESTINAL PANEL BY PCR, STOOL (REPLACES STOOL CULTURE)  C DIFFICILE QUICK SCREEN W PCR REFLEX  LIPASE, BLOOD  URINALYSIS COMPLETEWITH MICROSCOPIC ([facility] ONLY)  TROPONIN I   ____________________________________________  EKG  ED ECG REPORT I, Aramis Zobel,  Youlanda Roys, the attending physician, personally viewed and interpreted this ECG.   Date: 11/07/2015  EKG Time: 1352  Rate: 102  Rhythm: sinus tachycardia  Axis: Normal axis  Intervals:none  ST&T Change: No ST elevation or depression. Single T-wave inversion in aVL.Marland Kitchen Machine read as minimal ST elevation in the inferior leads however it appears that it is more likely this is related to the patient's baseline on the EKG. EKG is unchanged from that taken on 05/01/2015. ____________________________________________  RADIOLOGY  ____________________________________________   PROCEDURES  Procedure(s) performed:   Procedures  Critical Care performed:   ____________________________________________   INITIAL IMPRESSION / ASSESSMENT AND PLAN / ED COURSE  Pertinent labs & imaging results that were available during my care of the patient were  reviewed by me and considered in my medical decision making (see chart for details).  ----------------------------------------- 2:30 PM on 11/07/2015 -----------------------------------------  Patient is resting comfortably at this time. Sodium found to be 129. No episodes of vomiting or diarrhea in the emergency department. However, the daughter is concerned about his recent worsening in his gait. It is possible this is secondary to the sodium. He was able to ambulate normally for me here in the emergency department but says he has had multiple falls at home. We'll admit to the hospital. Signed out to Dr. Margaretmary Eddy.   Clinical Course     ____________________________________________   FINAL CLINICAL IMPRESSION(S) / ED DIAGNOSES  Hyponatremia. Nausea vomiting and diarrhea.    NEW MEDICATIONS STARTED DURING THIS VISIT:  New Prescriptions   No medications on file     Note:  This document was prepared using Dragon voice recognition software and may include unintentional dictation errors.    Orbie Pyo, MD 11/07/15 [phone]

## 2015-11-07 NOTE — H&P (Signed)
[place] [facility] [place] NAME: Stephen [name]    MR#:  [id number]  DATE OF BIRTH:  1945/07/31  DATE OF ADMISSION:  11/07/2015  PRIMARY CARE PHYSICIAN: [name], MD   REQUESTING/REFERRING PHYSICIAN: Dr. Larae Grooms  CHIEF COMPLAINT:   Chief Complaint  Patient presents with  . Other  . Nausea  . Emesis    HISTORY OF PRESENT ILLNESS:  Stephen [name]  is a 70 y.o. 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 Patient says he normally has diarrhea but has progressively gotten worse over the past week. He has had about 67 loose bowel movements every day for the past few days. The bowel movements have been black and watery in nature. He denies any melena or hematochezia. He also says that he has felt increasingly weak since his diarrhea has gotten worse and he also has had about 2-3 falls over the past few days. He presented to the ER due to his profound weakness and worsening symptoms and was noted to be hyponatremic. Hospitalist services were contacted further treatment and evaluation.  PAST MEDICAL HISTORY:   Past Medical History:  Diagnosis Date  . Diabetes mellitus without complication ([place])   . Psoriasis   . Skin abnormalities     PAST SURGICAL HISTORY:   Past Surgical History:  Procedure Laterality Date  . COLONOSCOPY  1995   one polyp  . ESOPHAGOGASTRODUODENOSCOPY N/A 05/02/2015   Procedure: ESOPHAGOGASTRODUODENOSCOPY (EGD);  Surgeon: Hulen Luster, MD;  Location: [facility] ENDOSCOPY;  Service: Endoscopy;  Laterality: N/A;    SOCIAL HISTORY:   Social History  Substance Use Topics  . Smoking status: Current Every Day Smoker    Packs/day: 1.00    Types: Cigarettes  . Smokeless tobacco: Former Systems developer      Comment: patient not ready to quit  . Alcohol use 0.0 oz/week     Comment: occassional, socially     FAMILY HISTORY:   Family History  Problem Relation Age of Onset  . Migraines Father     DRUG ALLERGIES:  No Known Allergies  REVIEW OF SYSTEMS:   Review of Systems  Constitutional: Positive for malaise/fatigue. Negative for fever and weight loss.  HENT: Negative for congestion, nosebleeds and tinnitus.   Eyes: Negative for blurred vision, double vision and redness.  Respiratory: Negative for cough, hemoptysis and shortness of breath.   Cardiovascular: Negative for chest pain, orthopnea, leg swelling and PND.  Gastrointestinal: Positive for diarrhea, nausea and vomiting. Negative for abdominal pain and melena.  Genitourinary: Negative for dysuria, hematuria and urgency.  Musculoskeletal: Positive for falls. Negative for joint pain.  Neurological: Positive for weakness. Negative for dizziness, tingling, sensory change, focal weakness, seizures and headaches.  Endo/Heme/Allergies: Negative for polydipsia. Does not bruise/bleed easily.  Psychiatric/Behavioral: Negative for depression and memory loss. The patient is not nervous/anxious.     MEDICATIONS AT HOME:   Prior to Admission medications   Medication Sig Start Date End Date Taking? Authorizing Provider  hydrOXYzine (ATARAX/VISTARIL) 25 MG tablet Take 25 mg by mouth every 6 (six) hours as needed.   Yes Historical Provider, MD  metFORMIN (GLUCOPHAGE) 500 MG tablet Take 1 tablet (500 mg total) by mouth 3 (three) times daily with meals. 07/31/15  Yes Glean Hess,  MD  feeding supplement, GLUCERNA SHAKE, (GLUCERNA SHAKE) LIQD Take 237 mLs by mouth 3 (three) times daily between meals. Patient not taking: Reported on 11/06/2015 07/31/15   Glean Hess, MD  folic acid (FOLVITE) 1 MG tablet Take 1 mg by mouth daily.    Historical Provider, MD  insulin aspart (NOVOLOG FLEXPEN) 100 UNIT/ML FlexPen Inject into the skin 3 (three)  times daily with meals.    Historical Provider, MD  Insulin Detemir (LEVEMIR FLEXPEN) 100 UNIT/ML Pen Inject into the skin daily at 10 pm.    Historical Provider, MD  mometasone (ELOCON) 0.1 % cream Apply topically daily. Patient not taking: Reported on 11/06/2015 07/31/15   Glean Hess, MD  pantoprazole (PROTONIX) 40 MG tablet Take 1 tablet (40 mg total) by mouth daily. Patient not taking: Reported on 11/06/2015 07/31/15   Glean Hess, MD      VITAL SIGNS:  Blood pressure 124/83, pulse 91, temperature 97.9 F (36.6 C), temperature source Oral, resp. rate 20, height 5\' 9"  (1.753 m), weight 49 kg (108 lb), SpO2 100 %.  PHYSICAL EXAMINATION:  Physical Exam  GENERAL:  70 y.o.-year-old patient lying in the bed in no acute distress.  EYES: Pupils equal, round, reactive to light and accommodation. No scleral icterus. Extraocular muscles intact.  HEENT: Head atraumatic, normocephalic. Oropharynx and nasopharynx clear. No oropharyngeal erythema, dry oral mucosa  NECK:  Supple, no jugular venous distention. No thyroid enlargement, no tenderness.  LUNGS: Normal breath sounds bilaterally, no wheezing, rales, rhonchi. No use of accessory muscles of respiration.  CARDIOVASCULAR: S1, S2 RRR. No murmurs, rubs, gallops, clicks.  ABDOMEN: Soft, nontender, nondistended. Bowel sounds present. No organomegaly or mass.  EXTREMITIES: No pedal edema, cyanosis, or clubbing. + 2 pedal & radial pulses b/l.   NEUROLOGIC: Cranial nerves II through XII are intact. No focal Motor or sensory deficits appreciated b/l. Globally weak.  PSYCHIATRIC: The patient is alert and oriented x 3. Good affect.  SKIN: No lesion, or ulcer. Patient has a dry excoriating rash throughout his body worse in the palms of his hands and his feet. Consistent with psoriasis.  LABORATORY PANEL:   CBC  Recent Labs Lab 11/07/15 1301  WBC 8.7  HGB 13.0  HCT 37.2*  PLT 188    ------------------------------------------------------------------------------------------------------------------  Chemistries   Recent Labs Lab 11/07/15 1301  NA 129*  K 4.6  CL 91*  CO2 26  GLUCOSE 152*  BUN 26*  CREATININE 1.06  CALCIUM 9.4  AST 31  ALT 15*  ALKPHOS 149*  BILITOT 1.1   ------------------------------------------------------------------------------------------------------------------  Cardiac Enzymes  Recent Labs Lab 11/07/15 1301  TROPONINI <0.03   ------------------------------------------------------------------------------------------------------------------  RADIOLOGY:  No results found.   IMPRESSION AND PLAN:   70 year old male with past medical history of diabetes, severe psoriasis, psoriatic arthritis who presents to the hospital due to nausea vomiting and diarrhea and noted to be hyponatremic and also having frequent falls.  1. Acute hyponatremia-secondary to the nausea vomiting and diarrhea. -I will hydrate the patient with IV fluids, follow sodium. -Check serum/urine osmolality.  2. Nausea vomiting diarrhea-etiology unclear. Patient has not been on any recent antibiotics. -Supportive care with clear liquid diet, IV fluids, antiemetics. Check stool for comprehensive culture and PCR for C. Difficile.  3. Frequent falls-secondary to the hyponatremia, ongoing nausea vomiting diarrhea -Continue supportive care as mentioned above. Physical therapy consult to assess mobility.  4. Diabetes type 2 without complication-I will place the patient sliding scale insulin for now.  5. GERD- cont.  Protonix.  6. Psoriasis - cont. Mometasone ointment.  - cont. Atarax for itching.      All the records are reviewed and case discussed with ED provider. Management plans discussed with the patient, family and they are in agreement.  CODE STATUS: Full   TOTAL TIME TAKING CARE OF THIS PATIENT: 45 minutes.    Henreitta Leber M.D on 11/07/2015 at  4:19 PM  45Between 7am to 6pm - Pager - [phone]  After 6pm go to [email or web address] - password [email or web address] [facility]  Office  [phone]  CC: Primary care physician; [name], MD

## 2015-11-07 NOTE — Patient Outreach (Signed)
Follow up call, check on pt's status.   Stephen [name] (pt's friend, on [facility] consent form) answered the phone, reports pt unable to talk right now, on the couch in and out.  HIPAA verified on pt.    Stephen [name] reports pt's daughter Stephen [name]) is coming today.  Stephen [name] also reports his girl friend Stephen [name] (also on pt's [facility] consent form) can tell you more about the plan for pt, requested RN CM call back.    Plan to call back, speak with Stephen [name] (pt's friend).     Zara Chess.   [place] [facility]  [phone]

## 2015-11-07 NOTE — Patient Outreach (Signed)
Received a call from [name] (pt's friend, on [facility] consent form), HIPAA verified on pt.  Seth Bake reports daughter is to come today as pt has an appointment to  see Dr. Army Melia.   Seth Bake reports plan is to have FL 2 done, ask for a rx for a walker with seat for pt.   Seth Bake reports she will also be going to MD office visit with pt.    Seth Bake to keep RN CM updated on outcome of MD office visit.     Zara Chess.   [place] [facility]  [phone]

## 2015-11-08 LAB — URINALYSIS COMPLETE WITH MICROSCOPIC (ARMC ONLY)
BACTERIA UA: NONE SEEN
Bilirubin Urine: NEGATIVE
Glucose, UA: 150 mg/dL — AB
Hgb urine dipstick: NEGATIVE
Leukocytes, UA: NEGATIVE
Nitrite: NEGATIVE
PROTEIN: NEGATIVE mg/dL
Specific Gravity, Urine: 1.017 (ref 1.005–1.030)
pH: 7 (ref 5.0–8.0)

## 2015-11-08 LAB — BASIC METABOLIC PANEL
ANION GAP: 8 (ref 5–15)
BUN: 20 mg/dL (ref 6–20)
CALCIUM: 8.2 mg/dL — AB (ref 8.9–10.3)
CO2: 26 mmol/L (ref 22–32)
Chloride: 100 mmol/L — ABNORMAL LOW (ref 101–111)
Creatinine, Ser: 0.9 mg/dL (ref 0.61–1.24)
GLUCOSE: 178 mg/dL — AB (ref 65–99)
POTASSIUM: 3.8 mmol/L (ref 3.5–5.1)
Sodium: 134 mmol/L — ABNORMAL LOW (ref 135–145)

## 2015-11-08 LAB — GLUCOSE, CAPILLARY
GLUCOSE-CAPILLARY: 144 mg/dL — AB (ref 65–99)
Glucose-Capillary: 249 mg/dL — ABNORMAL HIGH (ref 65–99)
Glucose-Capillary: 161 mg/dL — ABNORMAL HIGH (ref 65–99)
Glucose-Capillary: 178 mg/dL — ABNORMAL HIGH (ref 65–99)

## 2015-11-08 LAB — CBC
HEMATOCRIT: 32.3 % — AB (ref 40.0–52.0)
HEMOGLOBIN: 11 g/dL — AB (ref 13.0–18.0)
MCH: 31.8 pg (ref 26.0–34.0)
MCHC: 34.2 g/dL (ref 32.0–36.0)
MCV: 93.1 fL (ref 80.0–100.0)
PLATELETS: 164 10*3/uL (ref 150–440)
RBC: 3.46 MIL/uL — AB (ref 4.40–5.90)
RDW: 16.5 % — ABNORMAL HIGH (ref 11.5–14.5)
WBC: 8.9 10*3/uL (ref 3.8–10.6)

## 2015-11-08 NOTE — Care Management Note (Signed)
Case Management Note  Patient Details  Name: Stephen [name]. MRN: [id number] Date of Birth: 1945/12/29  Subjective/Objective:      Called to let [facility] know that discharge for today was cancelled for today.               Action/Plan:   Expected Discharge Date:                  Expected Discharge Plan:     In-House Referral:     Discharge planning Services     Post Acute Care Choice:    Choice offered to:     DME Arranged:    DME Agency:     HH Arranged:    HH Agency:     Status of Service:     If discussed at [employer] of Stay Meetings, dates discussed:    Additional Comments:  Stephen [name] A, RN 11/08/2015, 2:01 PM

## 2015-11-08 NOTE — Care Management Note (Signed)
Case Management Note  Patient Details  Name: Glenwood Pendelton. MRN: [id number] Date of Birth: Jan 08, 1946  Subjective/Objective:       Referral faxed to [facility] for HH-PT services. No ride home at this point. Will provide transportation options.              Action/Plan:   Expected Discharge Date:                  Expected Discharge Plan:     In-House Referral:     Discharge planning Services     Post Acute Care Choice:    Choice offered to:     DME Arranged:    DME Agency:     HH Arranged:    HH Agency:     Status of Service:     If discussed at [employer] of Stay Meetings, dates discussed:    Additional Comments:  Mariella Blackwelder A, RN 11/08/2015, 11:12 AM

## 2015-11-08 NOTE — Evaluation (Signed)
Physical Therapy Evaluation Patient Details Name: Stephen [name]. MRN: [id number] DOB: 1945-06-14 Today's Date: 11/08/2015   History of Present Illness  presented to ER secondary to nausea/vomiting, diarrhea with progressive weakness; admitted with hyponatremia (Na+ currently 134).  Clinical Impression  Upon evaluation, patient alert and oriented; follows simple commands, though begrudgingly at times.  Bilat UE/LE strength and ROM grossly symmetrical and WFL for basic transfers and mobility; no focal weakness or sensory deficit appreciated.  Patient somewhat perseverative on his "dead skin", and feels this (vs. Diarrhea, nausea/vomiting) is his primary reason for admission. Able to complete bed mobility indep; sit/stand, basic transfers and gait (200') with SPC, cga/close sup.  R lateral LOB x1 that patient able to self-recover using LE step strategy; however, higher level balance deficits (mild sway/staggering) noted with sudden turns/changes of direction and additional dynamic gait components. Would benefit from skilled PT to address above deficits and promote optimal return to PLOF; Recommend transition to [facility] upon discharge from acute hospitalization.     Follow Up Recommendations Home health PT (for home safety assessment, higher-level balance assessment/training)    Equipment Recommendations  Cane    Recommendations for Other Services       Precautions / Restrictions Precautions Precautions: Fall Precaution Comments: Enteric isolation Restrictions Weight Bearing Restrictions: No      Mobility  Bed Mobility Overal bed mobility: Modified Independent                Transfers Overall transfer level: Needs assistance Equipment used: Straight cane Transfers: Sit to/from Stand Sit to Stand: Supervision            Ambulation/Gait Ambulation/Gait assistance: Min guard;Supervision Ambulation Distance (Feet): 200 Feet Assistive device: Straight cane   Gait  velocity: 10' walk time, 7 seconds   General Gait Details: reciprocal stepping pattern with fair step height/length; decreased cadence, though fairly steady.  Single R lateral LOB that patient able to self-correct with LE step-strategy  Stairs            Wheelchair Mobility    Modified Rankin (Stroke Patients Only)       Balance Overall balance assessment: Needs assistance Sitting-balance support: No upper extremity supported;Feet supported Sitting balance-Leahy Scale: Good     Standing balance support: Single extremity supported Standing balance-Leahy Scale: Fair Standing balance comment: higher-level balance deficits evident with turns and sudden changes of direction/dynamic gait components                             Pertinent Vitals/Pain Pain Assessment: No/denies pain    Home Living Family/patient expects to be discharged to:: Private residence Living Arrangements: Non-relatives/Friends Available Help at Discharge: Friend(s) Type of Home: House Home Access: Level entry     Home Layout: Two level;Able to live on main level with bedroom/bathroom   Additional Comments: reports living with a friend who works outside of the home; patient alone while friend working    Prior Function Level of Independence: Independent         Comments: Recent use of SPC due to weakness; does endorse at least 1-2 falls in recent weeks     Hand Dominance        Extremity/Trunk Assessment   Upper Extremity Assessment: Overall WFL for tasks assessed (grossly at least 4+/5; denies sensory changes.  does complain of "dead skin" throughout extremities (esp hands and feet))           Lower Extremity Assessment: Overall  WFL for tasks assessed (grossly at least 4+/5, denies sensory changes)         Communication   Communication: No difficulties  Cognition Arousal/Alertness: Awake/alert Behavior During Therapy: Flat affect Overall Cognitive Status: Within  Functional Limits for tasks assessed                      General Comments      Exercises Other Exercises Other Exercises: Refused additional assessment, balance training as breakfast tray arrived to room during gait assessment      Assessment/Plan    PT Assessment Patient needs continued PT services  PT Diagnosis Difficulty walking;Generalized weakness   PT Problem List Decreased balance;Decreased mobility  PT Treatment Interventions DME instruction;Gait training;Stair training;Functional mobility training;Therapeutic activities;Therapeutic exercise;Patient/family education   PT Goals (Current goals can be found in the Care Plan section) Acute Rehab PT Goals Patient Stated Goal: to go home, but not today PT Goal Formulation: With patient Time For Goal Achievement: 11/22/15 Potential to Achieve Goals: Good    Frequency Min 2X/week   Barriers to discharge Decreased caregiver support      Co-evaluation               End of Session Equipment Utilized During Treatment: Gait belt Activity Tolerance: Patient tolerated treatment well Patient left: in chair;with chair alarm set;with call bell/phone within reach Nurse Communication: Mobility status         Time: [id number] PT Time Calculation (min) (ACUTE ONLY): 22 min   Charges:   PT Evaluation $PT Eval Low Complexity: 1 Procedure     PT G Codes:        Dell Briner H. Owens Shark, PT, DPT, NCS 11/08/15, 10:43 AM [phone]

## 2015-11-08 NOTE — Plan of Care (Signed)
Problem: Pain Managment: Goal: General experience of comfort will improve Outcome: Progressing Pt continues to complain of extreme itching.  Applied lotion to arms and legs.  Atarax x 2 given with relief between doses.   Problem: Fluid Volume: Goal: Ability to maintain a balanced intake and output will improve Outcome: Progressing Pt tolerating clear liquid diet and requesting graham crackers.  Problem: Bowel/Gastric: Goal: Will not experience complications related to bowel motility Outcome: Progressing No BM this shift.

## 2015-11-08 NOTE — Plan of Care (Signed)
Problem: Nutrition: Goal: Adequate nutrition will be maintained Outcome: Progressing Glucerna provided. Tolerating well.

## 2015-11-08 NOTE — Progress Notes (Signed)
Initial Nutrition Assessment  DOCUMENTATION CODES:   Severe malnutrition in context of chronic illness  INTERVENTION:  1.  Supplements; Glucerna shake TID.  Patient states he has been doing supplements at home prn.  2.  Meals/snacks; encourage intake as able.  Patient states "I eat went I want." 3.  General healthful diet; attempted to discuss nutrition status with patient who is disinterested in counseling or additional supplements at this time.  Patient respectfully declines additional nutrition-related interventions.    NUTRITION DIAGNOSIS:   Malnutrition related to social / environmental circumstances as evidenced by severe depletion of body fat, severe depletion of muscle mass.  GOAL:   Patient will meet greater than or equal to 90% of their needs  MONITOR:   PO intake, Supplement acceptance  REASON FOR ASSESSMENT:   Consult Assessment of nutrition requirement/status  ASSESSMENT:   RD consulted for poor PO intake.  Patient is a 70 y.o. 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 Patient says he normally has diarrhea but has progressively gotten worse over the past week.  Patient states he lives with friends and has access to food.  States he has lost weight, but denies concern for weight loss.  Patient states he typically drinks supplements at home, but is not interested in having any here.  After discussion, is willing to leave order in place and will request as he desires.  His lunch tray at bedside is 50% consumed.  States he ate a large, late breakfast.  Plans for d/c today have been canceled.   Patient politely declines additional interventions despite weight and malnutrition status.    Patient is likely eating per his usual at this time  which is chronically inadequate.  Limited nutrition focused physical exam performed noting severe depletion in several areas.   Diet Order:  Diet Carb Modified Fluid consistency: Thin; Room service appropriate? Yes  Skin:  Reviewed, no issues  Last BM:  7/28  Height:   Ht Readings from Last 1 Encounters:  11/07/15 5\' 10"  (1.778 m)    Weight:   Wt Readings from Last 1 Encounters:  11/07/15 112 lb 14.4 oz (51.2 kg)    Ideal Body Weight:     BMI:  Body mass index is 16.2 kg/m.  Estimated Nutritional Needs:   Kcal:  1550-1750 kcal  Protein:  80-100g  Fluid:  >1.5 L/day  EDUCATION NEEDS:   Education needs addressed  Brynda Greathouse, MS RD LDN Clinical Inpatient Dietitian Weekend/After hours pager: [phone]

## 2015-11-08 NOTE — Care Management Important Message (Signed)
Important Message  Patient Details  Name: Stephen [name]. MRN: [id number] Date of Birth: 13-Jul-1945   Medicare Important Message Given:  Yes    Nysir Fergusson A, RN 11/08/2015, 3:16 PM

## 2015-11-08 NOTE — Discharge Instructions (Addendum)
Diabetic diet  Activity as tolerated with a cane.  QUIT ALCOHOL

## 2015-11-09 DIAGNOSIS — E43 Unspecified severe protein-calorie malnutrition: Secondary | ICD-10-CM | POA: Insufficient documentation

## 2015-11-09 LAB — GLUCOSE, CAPILLARY
GLUCOSE-CAPILLARY: 117 mg/dL — AB (ref 65–99)
GLUCOSE-CAPILLARY: 230 mg/dL — AB (ref 65–99)

## 2015-11-09 NOTE — Clinical Social Work Note (Deleted)
Patient d/c to [facility], Room 409 via EMS due to inability to travel via wheelchair van (hip fracture). Patient and her daughter Stephen [name] [phone]) aware of d/c plan. D/c info sent via hub to [facility] 11/09/2015.  [place] MSW, [place]

## 2015-11-09 NOTE — Progress Notes (Signed)
[place] [facility] [place] NAME: Stephen [name]    MR#:  [id number]  DATE OF BIRTH:  [birth year]  SUBJECTIVE:  CHIEF COMPLAINT:   Chief Complaint  Patient presents with  . Other  . Nausea  . Emesis   Weak. Better today Ambulated with PT  REVIEW OF SYSTEMS:    Review of Systems  Constitutional: Negative for chills and fever.  HENT: Negative for sore throat.   Eyes: Negative for blurred vision, double vision and pain.  Respiratory: Negative for cough, hemoptysis, shortness of breath and wheezing.   Cardiovascular: Negative for chest pain, palpitations, orthopnea and leg swelling.  Gastrointestinal: Negative for abdominal pain, constipation, diarrhea, heartburn, nausea and vomiting.  Genitourinary: Negative for dysuria and hematuria.  Musculoskeletal: Negative for back pain and joint pain.  Skin: Negative for rash.  Neurological: Negative for sensory change, speech change, focal weakness and headaches.  Endo/Heme/Allergies: Does not bruise/bleed easily.  Psychiatric/Behavioral: Negative for depression. The patient is not nervous/anxious.     DRUG ALLERGIES:  No Known Allergies  VITALS:  Blood pressure 121/63, pulse (!) 101, temperature 98.9 F (37.2 C), resp. rate 17, height 5\' 10"  (1.778 m), weight 51.2 kg (112 lb 14.4 oz), SpO2 98 %.  PHYSICAL EXAMINATION:   Physical Exam  GENERAL:  70 y.o.-year-old patient lying in the bed with no acute distress.  EYES: Pupils equal, round, reactive to light and accommodation. No scleral icterus. Extraocular muscles intact.  HEENT: Head atraumatic, normocephalic. Oropharynx and nasopharynx clear.  NECK:  Supple, no jugular venous distention. No thyroid enlargement, no tenderness.  LUNGS: Normal breath sounds bilaterally, no wheezing, rales, rhonchi. No use of accessory muscles of respiration.  CARDIOVASCULAR: S1, S2 normal. No murmurs, rubs, or gallops.  ABDOMEN: Soft, nontender,  nondistended. Bowel sounds present. No organomegaly or mass.  EXTREMITIES: No cyanosis, clubbing or edema b/l.    NEUROLOGIC: Cranial nerves II through XII are intact. No focal Motor or sensory deficits b/l.   PSYCHIATRIC: The patient is alert and oriented x 3.  SKIN: No obvious rash, lesion, or ulcer.   LABORATORY PANEL:   CBC  Recent Labs Lab 11/08/15 0512  WBC 8.9  HGB 11.0*  HCT 32.3*  PLT 164   ------------------------------------------------------------------------------------------------------------------ Chemistries   Recent Labs Lab 11/07/15 1301 11/08/15 0512  NA 129* 134*  K 4.6 3.8  CL 91* 100*  CO2 26 26  GLUCOSE 152* 178*  BUN 26* 20  CREATININE 1.06 0.90  CALCIUM 9.4 8.2*  AST 31  --   ALT 15*  --   ALKPHOS 149*  --   BILITOT 1.1  --    ------------------------------------------------------------------------------------------------------------------  Cardiac Enzymes  Recent Labs Lab 11/07/15 1301  TROPONINI <0.03   ------------------------------------------------------------------------------------------------------------------  RADIOLOGY:  No results found.   ASSESSMENT AND PLAN:   70 year old male with past medical history of diabetes, severe psoriasis, psoriatic arthritis who presents to the hospital due to nausea vomiting and diarrhea and noted to be hyponatremic and also having frequent falls.  1. Acute hypovolemic  hyponatremia-secondary to the nausea vomiting and diarrhea. 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 Nausea vomiting diarrhea Likely   3. Frequent falls- Secondary to alcohol  4. Diabetes type 2 without complication SSI  5. GERD- cont. Protonix.  6. Psoriasis - cont. Mometasone ointment.  - cont. Atarax for itching.    All the records are reviewed and case discussed with Care Management/Social Workerr. Management plans  discussed with the patient, family and they are in  agreement.  CODE STATUS: FULL CODE  DVT Prophylaxis: SCDs  TOTAL TIME TAKING CARE OF THIS PATIENT: 35 minutes.   POSSIBLE D/C IN 1-2 DAYS, DEPENDING ON CLINICAL CONDITION.  Hillary Bow R M.D on 11/09/2015 at 7:29 AM  Between 7am to 6pm - Pager - [phone]  After 6pm go to [email or web address] - password [email or web address] [facility]  Office  [phone]  CC: Primary care physician; [name], MD  Note: This dictation was prepared with Dragon dictation along with smaller phrase technology. Any transcriptional errors that result from this process are unintentional.

## 2015-11-09 NOTE — Progress Notes (Signed)
Pt given d/c instructions r/t activity, diet, stop etoh use, stop smoking, followup care and medications, voiced understanding. Personal belongings out of safe given to pt by security, pt verified correct.  Pt d/c home per orders via wheelchair escorted by staff and friends.

## 2015-11-10 ENCOUNTER — Other Ambulatory Visit: Payer: Self-pay | Admitting: *Deleted

## 2015-11-10 NOTE — Patient Outreach (Signed)
Transition of care call successful (recent in patient stay 7/28-7/30 dehydration,hyponatremia).   Spoke with pt, HIPAA verified.  Pt reports moving around, not fast, feels better.  Pt reports not drinking the Glucerna, appetite better.  RN CM inquired about checking his sugars to which pt reports plans to start.  Pt reports he also will be picking up his Metformin from daughter's house today, start taking.  Pt reports has an appointment scheduled to follow up with Dr. Army Melia.  Plan to follow up with pt again this week telephonically- part of ongoing transition of care.   Zara Chess.   [place] Care Management  [phone]

## 2015-11-10 NOTE — Patient Outreach (Signed)
Received a call from pt's friend Stephen [name] (on [facility] consent form), HIPAA provided on pt.   Stephen [name] reports  Stephen [name] (pt's friend) picked up pt from the hospital (admission 7/28-7/30), brought him to his house.  Stephen [name] reports Stephen [name] (on [facility] consent form) went to [employer] yesterday and spoke with social worker Stephen [name].   Stephen [name] also reports on visit to Dr. Gaspar Cola office 7/28, MD filled out [place] 2 form and faxed it to [facility] social worker.   Stephen [name] reports she called [facility] social worker this am, left a voice message, waiting to hear back.    As discussed, RN CM to call pt back for transition of care.    Stephen [name].   [place] Care Management  [phone]

## 2015-11-10 NOTE — Patient Outreach (Addendum)
[facility]) Care [name]  11/10/2015  Stephen [name]. 09/03/1945 [id number]   Subjective: Telephone call from patient's friend Stephen [name]), verified patient's name, date of birth, and address.   RNCM has received verbal authorization during previous conversation with patient, from patient, to speak from friend.  Ms. Stephen [name] states she has also a left voicemail message for Stephen [name].    States patient was discharged from hospital on 11/09/15.   States she and her friend are requesting assistance with getting patient placed at [name] (skilled nursing facility) as soon as possible.  States the [facility] has been completed, they have went to see the facility on 11/09/15, and states facility currently has a few beds available.  RNCM advised Stephen [name], Stephen [name] may currently be in the office in a meeting and will give her a message to call Stephen [name] and the above update. Telephone call to [facility] at [name], advised of about conversation with patient's friend.   States she will currently on her way to visit another patient and will follow up with Stephen [name] at a later time. Patient will continue to receive [name] services.   Objective:Per chart review:Patient hospitalized at [facility] 09/11/15 - 10/07/15 for suicidal ideation, sepsis, hospital acquired pneumonia, and C. difficile colitis. Patient discharged to [facility] (skilled nursing facility on 10/07/15 and discharged from facility to friends home, where he has been staying for approximately 1 week. Patient has been active with [name] in the past, was discharged on 05/23/15 due moving out of service area with daughter and patient stated he did not have any further care [name] needs.   Assessment:Received MD referral on 10/28/15. Referral sources: Dr. Halina [name]. Reason for referral: Uncontrolled diabetes, poor  social support, medication [name], and community resources. Telephone screen completed. Patient has no Telephonic RNCM needs at this time. Patient is being followed by Stephen [name] and Stephen [name].   Plan: RNCM has referred patient to [facility] for diabetes care coordination, disease [name], disease monitoring, assess medication [name], assess home safety, and transition of care due to recent nursing home discharge.  RNCM has referredpatient to [place] [name] Social [name] for [employer] identification for housing and possible placement.    Stephen [name] H. Annia Friendly, BSN, [place] [name] Telephonic CM Phone: [phone] Fax: [phone]

## 2015-11-10 NOTE — Patient Outreach (Signed)
[facility]) Care Management  11/10/2015  Keane Martelli. [birth year] [id number]   Phone call from patient's daughter [name] stating that patient had discharged from the hospital, home with his neighbors.  Patient's daughter states that patient is open to long term care and would like assistance with applying for long term Medicaid.  Patient's income statements provided to start long term medicaid application process.  Phone call from patient's neighbor Seth Bake, who states that patient will need to find a permanent residence soon as she will be traveling out of town soon and there will be no one during the day to provide care for patient. Per Seth Bake, she and her boyfriend visited [facility] on patient's behalf and there were no beds available.  Per Seth Bake [facility]-10/19/15 and was discharged due to treatment  goals being met.  It was confirmed that patient will receive [place] PT through [facility].  Contact information for Seth Bake provided to [place]  in case patient could not be reached on his phone to arrange for PT services.   Plan:  Home visit schedule for 11/11/15 to discuss long term plan.    [name] [facility] Care Management [phone]

## 2015-11-11 ENCOUNTER — Telehealth: Payer: Self-pay

## 2015-11-11 ENCOUNTER — Other Ambulatory Visit: Payer: Self-pay | Admitting: *Deleted

## 2015-11-11 ENCOUNTER — Other Ambulatory Visit: Payer: Self-pay | Admitting: Internal Medicine

## 2015-11-11 DIAGNOSIS — E119 Type 2 diabetes mellitus without complications: Secondary | ICD-10-CM | POA: Diagnosis not present

## 2015-11-11 DIAGNOSIS — E46 Unspecified protein-calorie malnutrition: Secondary | ICD-10-CM | POA: Diagnosis not present

## 2015-11-11 DIAGNOSIS — Z794 Long term (current) use of insulin: Secondary | ICD-10-CM | POA: Diagnosis not present

## 2015-11-11 DIAGNOSIS — F17210 Nicotine dependence, cigarettes, uncomplicated: Secondary | ICD-10-CM | POA: Diagnosis not present

## 2015-11-11 DIAGNOSIS — R296 Repeated falls: Secondary | ICD-10-CM | POA: Diagnosis not present

## 2015-11-11 DIAGNOSIS — Z7984 Long term (current) use of oral hypoglycemic drugs: Secondary | ICD-10-CM | POA: Diagnosis not present

## 2015-11-11 DIAGNOSIS — L409 Psoriasis, unspecified: Secondary | ICD-10-CM | POA: Diagnosis not present

## 2015-11-11 DIAGNOSIS — K219 Gastro-esophageal reflux disease without esophagitis: Secondary | ICD-10-CM | POA: Diagnosis not present

## 2015-11-11 DIAGNOSIS — M6281 Muscle weakness (generalized): Secondary | ICD-10-CM | POA: Diagnosis not present

## 2015-11-11 DIAGNOSIS — R634 Abnormal weight loss: Secondary | ICD-10-CM | POA: Diagnosis not present

## 2015-11-11 NOTE — Telephone Encounter (Signed)
He needs an OV for the walker and to discuss the handicapped sticker.  He can continue to take metformin only.

## 2015-11-11 NOTE — Patient Outreach (Signed)
[facility]) Care Management  [facility] Social Work  11/11/2015  Stephen [name]. [birth year] [id number]  Subjective:  Patient is a 70 year old male, currently residing with friends.  Patient reports feeling much better, appetite has improved.  Objective:   Encounter Medications:  Outpatient Encounter Prescriptions as of 11/11/2015  Medication Sig Note  . folic acid (FOLVITE) 1 MG tablet Take 1 mg by mouth daily.   . hydrOXYzine (ATARAX/VISTARIL) 25 MG tablet Take 25 mg by mouth every 6 (six) hours as needed.   . metFORMIN (GLUCOPHAGE) 500 MG tablet Take 1 tablet (500 mg total) by mouth 3 (three) times daily with meals. 11/10/2015: Pt to pick up today and start taking.   . pantoprazole (PROTONIX) 40 MG tablet Take 1 tablet (40 mg total) by mouth daily.   . feeding supplement, GLUCERNA SHAKE, (GLUCERNA SHAKE) LIQD Take 237 mLs by mouth 3 (three) times daily between meals. (Patient not taking: Reported on 11/06/2015) 11/10/2015: Pt reports eating better.    . insulin aspart (NOVOLOG FLEXPEN) 100 UNIT/ML FlexPen Inject into the skin 3 (three) times daily with meals.   . Insulin Detemir (LEVEMIR FLEXPEN) 100 UNIT/ML Pen Inject into the skin daily at 10 pm. 11/07/2015: Pt states he's afraid to take it bc his sugar goes too low.  He takes it once a month or so, if he feels like he needs it.  . mometasone (ELOCON) 0.1 % cream Apply topically daily. (Patient not taking: Reported on 11/06/2015)    No facility-administered encounter medications on file as of 11/11/2015.     Functional Status:  In your present state of health, do you have any difficulty performing the following activities: 11/07/2015 11/06/2015  Hearing? N N  Vision? N N  Difficulty concentrating or making decisions? N N  Walking or climbing stairs? Y Y  Dressing or bathing? Y Y  Doing errands, shopping? [name]  Preparing Food and eating ? - Y  Using the Toilet? - N  In the past six months, have you accidently leaked urine?  - Y  Do you have problems with loss of bowel control? - N  Managing your Medications? - N  Managing your Finances? - N  Housekeeping or managing your Housekeeping? - Y  Some recent data might be hidden    Fall/Depression Screening:  PHQ 2/9 Scores 05/23/2015 09/16/2014 09/16/2014  PHQ - 2 Score 0 0 0    Assessment:  Home visit to see patient . Patient has now declined long term care stating that he would like to get his own apartment. Patient would like to remain in the [place] area.  Patient's friends, [name] and Sherol Dade that he currently resides with continue to show their support and want patient to reside in the [facility] area as well so that they can check on him regularly.   [facility]. PT scheduled to come out today to complete assessment.  Sherol Dade requested a referral for a clothing voucher through the [employer].  Phone call made, the will need a letter stating need an dvoucher will be provided after appointment is made with Cindy(social worker with the [employer]) [phone].  Patient also  Called the  Dermatologist today, he is currently waiting on return call regarding treatment of skin issue.  Patient and the friends patient resides with have already looked into several apartments [place], [facility].  Application submitted, patient on waiting list.  Patient refuses to take his insulin but agrees  to take his metformin.  Plan: This Education officer, museum will assist with a list of available housing options for patient. This Education officer, museum will complete letter for clothing voucher for patient through the [employer].   [name] [facility] Care Management [phone]

## 2015-11-11 NOTE — Telephone Encounter (Signed)
[facility] said Hospital wants him to follow up. She was not sure if need to since seen 7/18. She needs Handicap sticker and RX for walker with a seat attached. She also was confused because there is Insulin listed on med list with no units. I advised 0-9 units Novalog but she said he reports that he NEVER has taken the injections since the first time when he got sick and his sugar dropped to fast. Refusing to take ANY insulin and states metformin is fine for him. His BS 120-180 range.   Should I schedule appt to get sticker and discuss the insulin?   Advised I may not get back to them until return of PCP to office.

## 2015-11-12 ENCOUNTER — Other Ambulatory Visit: Payer: Self-pay | Admitting: *Deleted

## 2015-11-12 NOTE — Discharge Summary (Signed)
[facility] NAME: Stephen [name]    MR#:  [id number]  DATE OF BIRTH:  03-10-1946  DATE OF ADMISSION:  11/07/2015 ADMITTING PHYSICIAN: Henreitta Leber, MD  DATE OF DISCHARGE: 11/09/2015  4:30 PM  PRIMARY CARE PHYSICIAN: [name], MD   ADMISSION DIAGNOSIS:  Hyponatremia [E87.1] Nausea vomiting and diarrhea [R11.2, R19.7]  DISCHARGE DIAGNOSIS:  Active Problems:   Hyponatremia   Protein-calorie malnutrition, severe   SECONDARY DIAGNOSIS:   Past Medical History:  Diagnosis Date  . Diabetes mellitus without complication ([place])   . Psoriasis   . Skin abnormalities      ADMITTING HISTORY  Stephen [name]  is a 70 y.o. 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 Patient says he normally has diarrhea but has progressively gotten worse over the past week. He has had about 67 loose bowel movements every day for the past few days. The bowel movements have been black and watery in nature. He denies any melena or hematochezia. He also says that he has felt increasingly weak since his diarrhea has gotten worse and he also has had about 2-3 falls over the past few days. He presented to the ER due to his profound weakness and worsening symptoms and was noted to be hyponatremic. Hospitalist services were contacted further treatment and evaluation.  HOSPITAL COURSE:   70 year old male with past medical history of diabetes, severe psoriasis, psoriatic arthritis who presents to the hospital due to nausea vomiting and diarrhea and noted to be hyponatremic and also having frequent falls.  1. Acute hypovolemic hyponatremia-secondary to the nausea vomiting and diarrhea. 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 Nausea vomiting diarrhea Likely Gastroenteritis  3. Frequent falls- Secondary to alcohol  4. Diabetes type 2 without complication SSI  5. GERD-cont.Protonix.  6. Psoriasis - cont. Mometasone ointment.  - cont. Atarax for itching.   Patient stable for discharge home after working with physical therapy. Follow up with PCP in 1 week. Advised to quit drinking alcohol.   CONSULTS OBTAINED:    DRUG ALLERGIES:  No Known Allergies  DISCHARGE MEDICATIONS:   Discharge Medication List as of 11/09/2015  3:35 PM    CONTINUE these medications which have NOT CHANGED   Details  hydrOXYzine (ATARAX/VISTARIL) 25 MG tablet Take 25 mg by mouth every 6 (six) hours as needed., Historical Med    metFORMIN (GLUCOPHAGE) 500 MG tablet Take 1 tablet (500 mg total) by mouth 3 (three) times daily with meals., Starting Thu 07/31/2015, Normal    feeding supplement, GLUCERNA SHAKE, (GLUCERNA SHAKE) LIQD Take 237 mLs by mouth 3 (three) times daily between meals., Starting Thu [id number], Normal    folic acid (FOLVITE) 1 MG tablet Take 1 mg by mouth daily., Historical Med    insulin aspart (NOVOLOG FLEXPEN) 100 UNIT/ML FlexPen Inject into the skin 3 (three) times daily with meals., Historical Med    Insulin Detemir (LEVEMIR FLEXPEN) 100 UNIT/ML Pen Inject into the skin daily at 10 pm., Historical Med    mometasone (ELOCON) 0.1 % cream Apply topically daily., Starting Thu 07/31/2015, Normal    pantoprazole (PROTONIX) 40 MG tablet Take 1 tablet (40 mg total) by mouth daily., Starting Thu 07/31/2015, Normal  Today   VITAL SIGNS:  Blood pressure (!) 154/90, pulse (!) 112, temperature 98.7 F (37.1 C), resp. rate 18, height 5\' 10"  (1.778 m), weight 51.2 kg (112 lb 14.4 oz), SpO2 100 %.  I/O:  No intake or output data in the 24 hours ending 11/12/15 1417  PHYSICAL EXAMINATION:  Physical Exam  GENERAL:  70 y.o.-year-old patient lying in the bed with  no acute distress.  LUNGS: Normal breath sounds bilaterally, no wheezing, rales,rhonchi or crepitation. No use of accessory muscles of respiration.  CARDIOVASCULAR: S1, S2 normal. No murmurs, rubs, or gallops.  ABDOMEN: Soft, non-tender, non-distended. Bowel sounds present. No organomegaly or mass.  NEUROLOGIC: Moves all 4 extremities. PSYCHIATRIC: The patient is alert and oriented x 3.  SKIN: No obvious rash, lesion, or ulcer.   DATA REVIEW:   CBC  Recent Labs Lab 11/08/15 0512  WBC 8.9  HGB 11.0*  HCT 32.3*  PLT 164    Chemistries   Recent Labs Lab 11/07/15 1301 11/08/15 0512  NA 129* 134*  K 4.6 3.8  CL 91* 100*  CO2 26 26  GLUCOSE 152* 178*  BUN 26* 20  CREATININE 1.06 0.90  CALCIUM 9.4 8.2*  AST 31  --   ALT 15*  --   ALKPHOS 149*  --   BILITOT 1.1  --     Cardiac Enzymes  Recent Labs Lab 11/07/15 1301  TROPONINI <0.03    Microbiology Results  Results for orders placed or performed during the hospital encounter of 12/14/14  MRSA PCR Screening     Status: None   Collection Time: 12/14/14 10:18 AM  Result Value Ref Range Status   MRSA by PCR NEGATIVE NEGATIVE Final    Comment:        The GeneXpert MRSA Assay (FDA approved for NASAL specimens only), is one component of a comprehensive MRSA colonization surveillance program. It is not intended to diagnose MRSA infection nor to guide or monitor treatment for MRSA infections.     RADIOLOGY:  No results found.  Follow up with PCP in 1 week.  Management plans discussed with the patient, family and they are in agreement.  CODE STATUS:  Code Status History    Date Active Date Inactive Code Status Order ID Comments User Context   11/07/2015  5:53 PM 11/09/2015  7:54 PM Full Code [id number]  Henreitta Leber, MD Inpatient   05/01/2015  7:18 PM 05/03/2015  5:07 PM Full Code [id number]  Aldean Jewett, MD Inpatient   12/14/2014 10:35 AM 12/15/2014  3:49 PM Full Code [id number]  Theodoro Grist, MD Inpatient       TOTAL TIME TAKING CARE OF THIS PATIENT ON DAY OF DISCHARGE: more than 30 minutes.   Hillary Bow R M.D on 11/12/2015 at 2:17 PM  Between 7am to 6pm - Pager - [phone]  After 6pm go to [email or web address] - password [email or web address] [facility]  Office  [phone]  CC: Primary care physician; [name], MD  Note: This dictation was prepared with Dragon dictation along with smaller phrase technology. Any transcriptional errors that result from this process are unintentional.

## 2015-11-12 NOTE — Telephone Encounter (Signed)
Advised 

## 2015-11-13 NOTE — Patient Outreach (Signed)
[facility]) Care Management  11/13/2015  Keefe Espinoza. 10/23/1945 [id number]   Phone call from [facility] consent to request  Assistance with applying for food stamps for patient.  Per Sherol Dade, [facility] PT has started.  She also received the letter requesting a clothing voucher for patient and  has an appointment on 11/14/15 with the [employer] to pick up seasonal clothing for patient.   Plan:  Home visit scheduled for 11/17/15 at 12:30pm to complete food stamp application.    [name] [facility] Care Management [phone]

## 2015-11-14 ENCOUNTER — Ambulatory Visit: Payer: Self-pay | Admitting: *Deleted

## 2015-11-17 ENCOUNTER — Encounter: Payer: Self-pay | Admitting: *Deleted

## 2015-11-17 ENCOUNTER — Other Ambulatory Visit: Payer: Self-pay | Admitting: *Deleted

## 2015-11-17 ENCOUNTER — Ambulatory Visit (INDEPENDENT_AMBULATORY_CARE_PROVIDER_SITE_OTHER): Payer: PPO | Admitting: Internal Medicine

## 2015-11-17 ENCOUNTER — Encounter: Payer: Self-pay | Admitting: Internal Medicine

## 2015-11-17 VITALS — BP 114/78 | HR 100 | Resp 16 | Ht 70.0 in | Wt 108.6 lb

## 2015-11-17 DIAGNOSIS — Z720 Tobacco use: Secondary | ICD-10-CM

## 2015-11-17 DIAGNOSIS — L409 Psoriasis, unspecified: Secondary | ICD-10-CM | POA: Diagnosis not present

## 2015-11-17 DIAGNOSIS — I739 Peripheral vascular disease, unspecified: Secondary | ICD-10-CM | POA: Diagnosis not present

## 2015-11-17 DIAGNOSIS — IMO0002 Reserved for concepts with insufficient information to code with codable children: Secondary | ICD-10-CM

## 2015-11-17 DIAGNOSIS — E1165 Type 2 diabetes mellitus with hyperglycemia: Secondary | ICD-10-CM

## 2015-11-17 DIAGNOSIS — R634 Abnormal weight loss: Secondary | ICD-10-CM | POA: Diagnosis not present

## 2015-11-17 DIAGNOSIS — E46 Unspecified protein-calorie malnutrition: Secondary | ICD-10-CM | POA: Diagnosis not present

## 2015-11-17 DIAGNOSIS — E1151 Type 2 diabetes mellitus with diabetic peripheral angiopathy without gangrene: Secondary | ICD-10-CM | POA: Diagnosis not present

## 2015-11-17 DIAGNOSIS — Z9181 History of falling: Secondary | ICD-10-CM | POA: Diagnosis not present

## 2015-11-17 MED ORDER — CLOBETASOL PROPIONATE 0.05 % EX OINT: TOPICAL_OINTMENT | Freq: Two times a day (BID) | CUTANEOUS | 5 refills | Status: DC

## 2015-11-17 NOTE — Patient Outreach (Signed)
Transition of care call (week 2) successful.   Follow up on recent hospitalization 7/28-7/30 Hyponatremia, nausea/vomiting/diarrhea.  Spoke with pt, HIPAA verified.  Pt reports on visit today with Dr. Army Melia, went good.  Pt reports appetite is better, eating three times a day.  Pt reports taking Metformin 500 mg tid, don't have to take insulin at this time.   Pt reports taking all of his other medications.  Pt reports sugars are doing good, this am was 320 which is good for him.   RN CM discussed with pt doing a home visit to which pt agreed.  Plan to follow up with pt next week- home visit (part of ongoing transition of care).   Zara Chess.   [place]  [phone]

## 2015-11-17 NOTE — Progress Notes (Signed)
Date:  11/17/2015   Name:  Stephen [name].   DOB:  02/15/1946   MRN:  [id number]   Chief Complaint: Gait Problem (discuss walker and handicap sticker) and Diabetes (Need written Rx for Diabetic Shoes with Dx code tot ake to [facility] ) Patient here for hospital follow up.  He was in [facility] for 2 days and discharged home.  He is now staying with friends until he can find an apartment.  Since being discharged he was seen by PT.  Health Nurse has also visited.  He has tried to walk on his own but gets very winded and needs to sit.  He would like to get a walker with seat to be more independent. He is eating better per friends.  His hands are better since using cloth gloves.  He needs a refill on topical creams. He continues to smoke and drink alcohol but states that he has cut back on liquor intake. He refuses to take insulin due to an experience with very low blood sugars after taking it in the past.   Diabetes  He presents for his follow-up diabetic visit. He has type 2 diabetes mellitus. His disease course has been improving. Pertinent negatives for hypoglycemia include no headaches or nervousness/anxiousness. Associated symptoms include weakness and weight loss. Pertinent negatives for diabetes include no chest pain and no foot ulcerations. Symptoms are improving. Diabetic complications include PVD. Current diabetic treatment includes oral agent (monotherapy). He is compliant with treatment most of the time. His breakfast blood glucose is taken between 8-9 am. His breakfast blood glucose range is generally 130-140 mg/dl.  Psoriasis  This is a chronic problem. The problem occurs constantly. The problem has been gradually improving since onset. The affected locations include the left hand, right hand, right foot and left foot. Associated symptoms include itching, nail changes and plaques. Past treatments include topical steroids. The treatment provided mild relief. He has been using treatment  for 2 or more years.   Lab Results  Component Value Date   HGBA1C 14.5 (H) 05/01/2015  \  Review of Systems  Constitutional: Positive for weight loss. Negative for activity change, appetite change and unexpected weight change.  Eyes: Negative for visual disturbance.  Respiratory: Negative for cough, chest tightness and shortness of breath.   Cardiovascular: Negative for chest pain, palpitations and leg swelling.  Gastrointestinal: Negative for abdominal distention and abdominal pain.  Musculoskeletal: Positive for gait problem.  Skin: Positive for color change, itching, nail changes and rash. Negative for wound.  Neurological: Positive for weakness. Negative for numbness and headaches.  Psychiatric/Behavioral: Negative for sleep disturbance. The patient is not nervous/anxious.     Patient Active Problem List   Diagnosis Date Noted  . PVD (peripheral vascular disease) ([place]) 11/17/2015  . Hx of falling 11/17/2015  . Protein-calorie malnutrition, severe 11/09/2015  . Moderate alcohol dependence ([place]) 09/12/2015  . Duodenitis 05/06/2015  . Hyponatremia 12/15/2014  . Thrombocytopenia ([place]) 12/15/2014  . Malnutrition ([place]) 12/15/2014  . Tobacco abuse 12/15/2014  . Cholelithiasis 12/15/2014  . Delusional disorder, unspecified type 12/14/2014  . Diabetes mellitus type 2, uncontrolled ([place]) 09/18/2014  . Psoriasis 09/16/2014  . Excessive weight loss 09/16/2014    Prior to Admission medications   Medication Sig Start Date End Date Taking? Authorizing Provider  B Complex Vitamins (VITAMIN B COMPLEX PO) Take by mouth. 10/07/15  Yes Historical Provider, MD  clobetasol ointment (TEMOVATE) 0.05 % Apply topically. 10/07/15  Yes Historical Provider, MD  clotrimazole (LOTRIMIN)  1 % cream Apply topically. 10/07/15 10/06/16 Yes Historical Provider, MD  [facility] ALLERGY 25 MG capsule TAKE 1 CAPSULE (25 MG TOTAL) BY MOUTH EVERY 4 (FOUR) HOURS AS NEEDED FOR ITCHING. 11/11/15  Yes Glean Hess, MD    feeding supplement, GLUCERNA SHAKE, (GLUCERNA SHAKE) LIQD Take 237 mLs by mouth 3 (three) times daily between meals. 07/31/15  Yes Glean Hess, MD  folic acid (FOLVITE) 1 MG tablet Take 1 mg by mouth daily.   Yes Historical Provider, MD  hydrOXYzine (ATARAX/VISTARIL) 25 MG tablet Take 25 mg by mouth. 10/07/15  Yes Historical Provider, MD  metFORMIN (GLUCOPHAGE) 500 MG tablet Take 1 tablet (500 mg total) by mouth 3 (three) times daily with meals. 07/31/15  Yes Glean Hess, MD  mometasone (ELOCON) 0.1 % cream Apply topically daily. 07/31/15  Yes Glean Hess, MD  pantoprazole (PROTONIX) 40 MG tablet Take 1 tablet (40 mg total) by mouth daily. 07/31/15  Yes Glean Hess, MD  insulin aspart (NOVOLOG FLEXPEN) 100 UNIT/ML FlexPen Inject into the skin 3 (three) times daily with meals.    Historical Provider, MD  Insulin Detemir (LEVEMIR FLEXPEN) 100 UNIT/ML Pen Inject into the skin daily at 10 pm.    Historical Provider, MD    No Known Allergies  Past Surgical History:  Procedure Laterality Date  . COLONOSCOPY  1995   one polyp  . ESOPHAGOGASTRODUODENOSCOPY N/A 05/02/2015   Procedure: ESOPHAGOGASTRODUODENOSCOPY (EGD);  Surgeon: Hulen Luster, MD;  Location[facility] ENDOSCOPY;  Service: Endoscopy;  Laterality: N/A;    Social History  Substance Use Topics  . Smoking status: Current Every Day Smoker    Packs/day: 1.00    Types: Cigarettes  . Smokeless tobacco: Former Systems developer     Comment: patient not ready to quit  . Alcohol use 0.0 oz/week     Comment: occassional, socially      Medication list has been reviewed and updated.   Physical Exam  Constitutional: He is oriented to person, place, and time. Vital signs are normal. He appears cachectic. No distress.  HENT:  Head: Normocephalic and atraumatic.  Neck: Carotid bruit is not present. No thyromegaly present.  Cardiovascular: Normal rate, regular rhythm, S1 normal and normal heart sounds.   Pulses:      Dorsalis pedis pulses are  1+ on the right side, and 1+ on the left side.       Posterior tibial pulses are 0 on the right side, and 0 on the left side.  Pulmonary/Chest: Effort normal. No respiratory distress. He has decreased breath sounds. He has no wheezes. He has no rhonchi.  Musculoskeletal: Normal range of motion.       Right knee: Normal.       Left knee: Normal.  Neurological: He is alert and oriented to person, place, and time. A sensory deficit is present.  Foot exam - flat feet, dystrophic nails, scaling without ulceration or weeping, decreased pulses and decreased sensation plantar aspect  Skin: Skin is warm and dry. Rash noted.  Diffuse scaling and dryness of hands and feet involving both aspects  Psychiatric: He has a normal mood and affect. His speech is normal and behavior is normal. Thought content normal.    BP 114/78 (BP Location: Left Arm, Patient Position: Sitting, Cuff Size: Small)   Pulse 100   Resp 16   Ht 5\' 10"  (1.778 m)   Wt 108 lb 9.6 oz (49.3 kg)   SpO2 100%   BMI 15.58 kg/m  Assessment and Plan: 1. Uncontrolled type 2 diabetes mellitus with hyperosmolarity without coma, without long-term current use of insulin (HCC) Continue metformin Rx for Diabetic shoes given  2. Excessive weight loss Should improve with treatment of DM and improved diet  3. Malnutrition ([place]) improving  4. Hx of falling Rx for Rollator given  5. Tobacco abuse Encouraged pt to quit  6. Psoriasis Improving - continue topical steroids and antihistamines   [name], MD [facility]  11/17/2015

## 2015-11-17 NOTE — Patient Outreach (Signed)
[facility]) Care Management  [facility] Social Work  11/17/2015  Stephen [name]. 03/19/1946 [id number]  Subjective:    Objective:   Encounter Medications:  Outpatient Encounter Prescriptions as of 11/17/2015  Medication Sig Note  . B Complex Vitamins (VITAMIN B COMPLEX PO) Take by mouth. 11/17/2015: Received from: [place]: Take 1 capsule by mouth daily.  . clobetasol ointment (TEMOVATE) 0.05 % Apply topically 2 (two) times daily.   . clotrimazole (LOTRIMIN) 1 % cream Apply topically. 11/17/2015: Received from: [place]: Apply topically Two (2) times a day.  . [employer] ALLERGY 25 MG capsule TAKE 1 CAPSULE (25 MG TOTAL) BY MOUTH EVERY 4 (FOUR) HOURS AS NEEDED FOR ITCHING.   . feeding supplement, GLUCERNA SHAKE, (GLUCERNA SHAKE) LIQD Take 237 mLs by mouth 3 (three) times daily between meals. 11/10/2015: Pt reports eating better.    . folic acid (FOLVITE) 1 MG tablet Take 1 mg by mouth daily.   . hydrOXYzine (ATARAX/VISTARIL) 25 MG tablet Take 25 mg by mouth. 11/17/2015: Received from: [place]: Take 1 tablet (25 mg total) by mouth every six (6) hours as needed (itching).  . metFORMIN (GLUCOPHAGE) 500 MG tablet Take 1 tablet (500 mg total) by mouth 3 (three) times daily with meals. 11/10/2015: Pt to pick up today and start taking.   . mometasone (ELOCON) 0.1 % cream Apply topically daily.   . pantoprazole (PROTONIX) 40 MG tablet Take 1 tablet (40 mg total) by mouth daily.   . [DISCONTINUED] insulin aspart (NOVOLOG FLEXPEN) 100 UNIT/ML FlexPen Inject into the skin 3 (three) times daily with meals.   . [DISCONTINUED] Insulin Detemir (LEVEMIR FLEXPEN) 100 UNIT/ML Pen Inject into the skin daily at 10 pm. 11/07/2015: Pt states he's afraid to take it bc his sugar goes too low.  He takes it once a month or so, if he feels like he needs it.   No facility-administered encounter medications on file as of 11/17/2015.     Functional Status:   In your present state of health, do you have any difficulty performing the following activities: 11/07/2015 11/06/2015  Hearing? N N  Vision? N N  Difficulty concentrating or making decisions? N N  Walking or climbing stairs? Y Y  Dressing or bathing? Y Y  Doing errands, shopping? [name]  Preparing Food and eating ? - Y  Using the Toilet? - N  In the past six months, have you accidently leaked urine? - Y  Do you have problems with loss of bowel control? - N  Managing your Medications? - N  Managing your Finances? - N  Housekeeping or managing your Housekeeping? - Y  Some recent data might be hidden    Fall/Depression Screening:  PHQ 2/9 Scores 11/17/2015 11/12/2015 05/23/2015 09/16/2014 09/16/2014  PHQ - 2 Score 0 0 0 0 0    Assessment:  Home visit to patient's home.  Patient just coming in from an appointment with primary care doctor when this social worker arrived.  Per Seth Bake (patient's friend and caregiver), she received the prescription for the rollator walker and diabetic shoes. Patient's provider ordered cream for his skin condition and medication to address the itching.   Patient continues to look for his own place, with the help of his friend and caregiver, Seth Bake  Per patient's current caregiver, [place] has a very long waiting list, [employer] has not reviewed his application yet, previous rental company [employer] has no openings.  Additional calls made to [place] apartments, [place], [place] during visit.  There were no openings.[place] [facility] website also reviewed with patient and caregiver.  Per caregiver, patient's apetite is good, ate  eggs, grits and sausage for breakfast today  This social worker informed patient of phone call earlier today from his daughter [name].  Patient's plan to get his own apartment discussed. Patient's daughter does not agree with this plan, fearing his decline once he is on his own.  Patient's ability to make his own  choice regarding his residence discussed as well as the fact that his current caregivers will continue to follow up with patient as needed.  Plan: Patient and caregiver to continue to work to identify rental options for patient,  This Education officer, museum to follow up in 1 month

## 2015-11-18 ENCOUNTER — Other Ambulatory Visit: Payer: Self-pay | Admitting: Internal Medicine

## 2015-11-18 DIAGNOSIS — I739 Peripheral vascular disease, unspecified: Secondary | ICD-10-CM

## 2015-11-18 DIAGNOSIS — M6281 Muscle weakness (generalized): Secondary | ICD-10-CM | POA: Diagnosis not present

## 2015-11-18 DIAGNOSIS — E46 Unspecified protein-calorie malnutrition: Secondary | ICD-10-CM

## 2015-11-18 DIAGNOSIS — Z9181 History of falling: Secondary | ICD-10-CM

## 2015-11-18 DIAGNOSIS — R269 Unspecified abnormalities of gait and mobility: Secondary | ICD-10-CM | POA: Diagnosis not present

## 2015-11-19 NOTE — Telephone Encounter (Signed)
This encounter was created in error - please disregard.

## 2015-11-21 ENCOUNTER — Ambulatory Visit: Payer: Self-pay | Admitting: *Deleted

## 2015-11-25 ENCOUNTER — Other Ambulatory Visit: Payer: Self-pay | Admitting: *Deleted

## 2015-11-26 NOTE — Patient Outreach (Signed)
[facility]) Care Management   11/25/15 home visit   Timothey Revill. 07-Oct-1945 [id number]  Latavian Bricker. is an 70 y.o. male  Subjective:  Pt reports eating three meals a day + one Glucerna.  Pt reports he has not weighed himself. Pt reports he can only eat soft foods, daughter needs to take his dentures in, get adjusted.  Pt reports checks  His ugars once a day, friend Seth Bake marks them down, sugars range [phone]- taking his Metformin  three times a day.  Pt reports he did get his walker and handicap sticker, has prescription for diabetic shoes- did not get Yet.  Pt reports skin (hands,feet) is getting better, MD gave him a prescription for a new cream, been using.  Pt reports moving the first of the month to [address], close by.    Objective:   Vitals:   11/25/15 1431  BP: 104/68  Pulse: (!) 110  Resp: (!) 24    ROS  Physical Exam  Constitutional: He is oriented to person, place, and time.  Thin.   Cardiovascular:  HR 110, no reports of chest pain, sob, feels fine.   Respiratory: Effort normal and breath sounds normal.  GI: Soft. Bowel sounds are normal.  Musculoskeletal: Normal range of motion.  Neurological: He is alert and oriented to person, place, and time.  Skin:  Psoriasis on hands/feet   Psychiatric: He has a normal mood and affect. His behavior is normal. Judgment and thought content normal.    Encounter Medications:   Outpatient Encounter Prescriptions as of 11/25/2015  Medication Sig Note  . B Complex Vitamins (VITAMIN B COMPLEX PO) Take by mouth. 11/17/2015: Received from: [place]: Take 1 capsule by mouth daily.  . [facility] ALLERGY 25 MG capsule TAKE 1 CAPSULE (25 MG TOTAL) BY MOUTH EVERY 4 (FOUR) HOURS AS NEEDED FOR ITCHING. 11/25/2015: Per pt, uses 3-4 times a day   . feeding supplement, GLUCERNA SHAKE, (GLUCERNA SHAKE) LIQD Take 237 mLs by mouth 3 (three) times daily between meals. 11/25/2015: Taking once  a day   . metFORMIN (GLUCOPHAGE) 500 MG tablet Take 1 tablet (500 mg total) by mouth 3 (three) times daily with meals. 11/10/2015: Pt to pick up today and start taking.   . triamcinolone (KENALOG) 0.025 % cream Apply 1 application topically 2 (two) times daily.   . clobetasol ointment (TEMOVATE) 0.05 % Apply topically 2 (two) times daily. (Patient not taking: Reported on 11/25/2015)   . clotrimazole (LOTRIMIN) 1 % cream Apply topically. 11/17/2015: Received from: [place]: Apply topically Two (2) times a day.  . folic acid (FOLVITE) 1 MG tablet Take 1 mg by mouth daily.   . hydrOXYzine (ATARAX/VISTARIL) 25 MG tablet Take 25 mg by mouth. 11/17/2015: Received from: [place]: Take 1 tablet (25 mg total) by mouth every six (6) hours as needed (itching).  . mometasone (ELOCON) 0.1 % cream Apply topically daily. (Patient not taking: Reported on 11/25/2015)   . pantoprazole (PROTONIX) 40 MG tablet Take 1 tablet (40 mg total) by mouth daily. (Patient not taking: Reported on 11/25/2015)    No facility-administered encounter medications on file as of 11/25/2015.     Functional Status:   In your present state of health, do you have any difficulty performing the following activities: 11/07/2015 11/06/2015  Hearing? N N  Vision? N N  Difficulty concentrating or making decisions? N N  Walking or climbing stairs?  Y Y  Dressing or bathing? Y Y  Doing errands, shopping? [name]  Preparing Food and eating ? - Y  Using the Toilet? - N  In the past six months, have you accidently leaked urine? - Y  Do you have problems with loss of bowel control? - N  Managing your Medications? - N  Managing your Finances? - N  Housekeeping or managing your Housekeeping? - Y  Some recent data might be hidden    Fall/Depression Screening:    PHQ 2/9 Scores 11/17/2015 11/12/2015 05/23/2015 09/16/2014 09/16/2014  PHQ - 2 Score 0 0 0 0 0    Assessment:  Pleasant 44 year old gentleman, resides with friends  in their apartment.  Lungs clear,  No c/o pain. HR today 110- no c/o sob or chest pain.  Noted pt walking to the bathroom- no issues  Ambulating.   DM- per pt most recent sugars [phone]   Plan:  Pt to continue to check sugars, friend record, call MD for abnormal readings.             Plan to continue to follow pt for transition of care- follow up again telephonically next week.               [facility] CM Care Plan Problem One   Flowsheet Row Most Recent Value  Care Plan Problem One  Risk for readmission related to recent hospitalization for hyponatremia, nausea/vomiting/diarrhea   Role Documenting the Problem One  Care Management [place] for Problem One  Active  THN Long Term Goal (31-90 days)  Pt will not readmit in the next 31 days   THN Long Term Goal Start Date  11/10/15  Interventions for Problem One Long Term Goal  Home visit done - part of transition of care   THN CM Short Term Goal #2 (0-30 days)  Pt would see a weight gain of 1-2 lbs in the next 14 days   THN CM Short Term Goal #2 Start Date  11/25/15 Donivan Scull established ]  Interventions for Short Term Goal #2  Reinforced need for pt to continue to eat 3 meals a day, take supplement     THN CM Care Plan Problem Two   Flowsheet Row Most Recent Value  THN CM Short Term Goal #1 (0-30 days)  Pt to check sugars, record, call MD for abnormal readings within next 30 days   THN CM Short Term Goal #1 Start Date  11/10/15  Interventions for Short Term Goal #2   Reviewed with pt recent sugars- reports friend marks them down.       Zara Chess.   [place] Care Management  [phone]

## 2015-11-27 ENCOUNTER — Encounter (INDEPENDENT_AMBULATORY_CARE_PROVIDER_SITE_OTHER): Payer: PPO | Admitting: Internal Medicine

## 2015-11-27 DIAGNOSIS — Z9181 History of falling: Secondary | ICD-10-CM

## 2015-11-27 DIAGNOSIS — E43 Unspecified severe protein-calorie malnutrition: Secondary | ICD-10-CM

## 2015-11-27 DIAGNOSIS — K2980 Duodenitis without bleeding: Secondary | ICD-10-CM

## 2015-11-27 DIAGNOSIS — Z720 Tobacco use: Secondary | ICD-10-CM

## 2015-11-27 DIAGNOSIS — L409 Psoriasis, unspecified: Secondary | ICD-10-CM

## 2015-11-27 DIAGNOSIS — E1100 Type 2 diabetes mellitus with hyperosmolarity without nonketotic hyperglycemic-hyperosmolar coma (NKHHC): Secondary | ICD-10-CM

## 2015-11-27 NOTE — Progress Notes (Signed)
Received orders from [place] for Certification and Plan of Care. Start of care 11/11/15 through 01/09/16. Orders are reviewed, signed and faxed.

## 2015-11-28 ENCOUNTER — Ambulatory Visit: Payer: Self-pay | Admitting: *Deleted

## 2015-12-01 ENCOUNTER — Other Ambulatory Visit: Payer: Self-pay | Admitting: Internal Medicine

## 2015-12-01 DIAGNOSIS — L409 Psoriasis, unspecified: Secondary | ICD-10-CM

## 2015-12-03 ENCOUNTER — Ambulatory Visit: Payer: Self-pay | Admitting: *Deleted

## 2015-12-03 ENCOUNTER — Other Ambulatory Visit: Payer: Self-pay | Admitting: *Deleted

## 2015-12-03 NOTE — Patient Outreach (Signed)
Attempt made to contact pt - transition of care call to follow up on recent hospitalization 7/28-7/30 hyponatremia, nausea/vomiting/diarrhea.   HIPAA compliant voice message left with contact number.    Plan: if no response to voice message left, will try again within next 2 days.     Zara Chess.   [place] Care Management  [phone]

## 2015-12-04 ENCOUNTER — Other Ambulatory Visit: Payer: Self-pay | Admitting: *Deleted

## 2015-12-04 NOTE — Patient Outreach (Signed)
Transition of care call successful, follow up on recent hospitalization 7/28-7/30 Hyponatremia, nausea/vomiting/diarrhea.   Spoke with pt, HIPAA verified.  Pt reports everything going fine, sugars great.  Pt reports sugars range from 76-95, friend Seth Bake still marking them down for him, as discussed- to bring to next upcoming MD appointment. RN CM discussed with pt if sugars start to run low, to call MD to which pt voiced understanding.  Pt reports appetite good, just got finished eating, mostly eating 3 meals a day, only having one ensure a day.   Pt reports he cannot tell if gaining weight, does not have a scale but pants are getting tighter.  Pt reports still planning to move the first of September.    Plan:  As discussed with pt, plan to call again next week- final transition of care call.    Zara Chess.   [place] Care Management  [phone]

## 2015-12-11 ENCOUNTER — Other Ambulatory Visit: Payer: Self-pay | Admitting: *Deleted

## 2015-12-11 NOTE — Patient Outreach (Signed)
Final transition of care call successful- follow up on recent hospitalization 7/28-7/30 Hyponatremia,nausea/vomiting/diarrhea.   Spoke with pt, HIPAA verified.  Pt reports doing good, sugars range 82-110, checking once a day, friend Seth Bake records.  Pt reports is good, was telling someone all I want to do is eat.  Pt reports still plans to move next month, picked up his keys today, start moving things in tomorrow.   RN CM discussed with pt moving out on his own, will he be recording his own sugars.  Pt reports he will be living close to his friend,still be coming over  to check on him but he said he would take care of recording his own sugars.   RN CM discussed with pt continuing to provide community nurse case management services (short term) to which pt agreed.  Plan: RN CM to complete pt's  transition of care program, goals.            RN CM to follow up with pt next month- home visit (assist pt with self management of Diabetes).    Zara Chess.   [place] Care Management  [phone]

## 2015-12-11 NOTE — Patient Outreach (Signed)
[facility]) Care Management  12/11/2015  Stephen [name]. 11-27-1945 [id number]   Phone call to patient to confirm his plans to move to his own apartment on 12/12/15.  Per patient, he has the keys to the apartment and plans to move by Monday or Tuesday.    Home visit re-scheduled post his move to his apartment. Home visit scheduled for 12/18/15.    [name] [facility] Care Management [phone]

## 2015-12-12 ENCOUNTER — Ambulatory Visit: Payer: Self-pay | Admitting: *Deleted

## 2015-12-18 ENCOUNTER — Other Ambulatory Visit: Payer: Self-pay | Admitting: *Deleted

## 2015-12-18 NOTE — Patient Outreach (Signed)
[name]) Care Management  [name]  12/18/2015  Stephen [name]. [birth year] [id number]  Subjective:    Objective:   Encounter Medications:  Outpatient Encounter Prescriptions as of 12/18/2015  Medication Sig Note  . B Complex Vitamins (VITAMIN B COMPLEX PO) Take by mouth. 11/17/2015: Received from: [name]: Take 1 capsule by mouth daily.  . clobetasol ointment (TEMOVATE) 0.05 % Apply topically 2 (two) times daily. (Patient not taking: Reported on 11/25/2015)   . clotrimazole (LOTRIMIN) 1 % cream Apply topically. 11/17/2015: Received from: [name]: Apply topically Two (2) times a day.  . [place] [name] ALLERGY 25 MG capsule TAKE 1 CAPSULE (25 MG TOTAL) BY MOUTH EVERY 4 (FOUR) HOURS AS NEEDED FOR ITCHING. 11/25/2015: Per pt, uses 3-4 times a day   . feeding supplement, GLUCERNA SHAKE, (GLUCERNA SHAKE) LIQD Take 237 mLs by mouth 3 (three) times daily between meals. 11/25/2015: Taking once a day   . folic acid (FOLVITE) 1 MG tablet Take 1 mg by mouth daily.   . hydrOXYzine (ATARAX/VISTARIL) 25 MG tablet Take 25 mg by mouth. 11/17/2015: Received from: [name]: Take 1 tablet (25 mg total) by mouth every six (6) hours as needed (itching).  . metFORMIN (GLUCOPHAGE) 500 MG tablet Take 1 tablet (500 mg total) by mouth 3 (three) times daily with meals. 11/10/2015: Pt to pick up today and start taking.   . mometasone (ELOCON) 0.1 % cream Apply topically daily. (Patient not taking: Reported on 11/25/2015)   . pantoprazole (PROTONIX) 40 MG tablet Take 1 tablet (40 mg total) by mouth daily. (Patient not taking: Reported on 11/25/2015)   . triamcinolone (KENALOG) 0.025 % cream Apply 1 application topically 2 (two) times daily.    No facility-administered encounter medications on file as of 12/18/2015.     Functional Status:  In your present state of health, do you have any difficulty performing the following activities: 11/07/2015  11/06/2015  Hearing? N N  Vision? N N  Difficulty concentrating or making decisions? N N  Walking or climbing stairs? Y Y  Dressing or bathing? Y Y  Doing errands, shopping? [name]  Preparing Food and eating ? - Y  Using the Toilet? - N  In the past six months, have you accidently leaked urine? - Y  Do you have problems with loss of bowel control? - N  Managing your Medications? - N  Managing your Finances? - N  Housekeeping or managing your Housekeeping? - Y  Some recent data might be hidden    Fall/Depression Screening:  PHQ 2/9 Scores 12/18/2015 11/17/2015 11/12/2015 05/23/2015 09/16/2014 09/16/2014  PHQ - 2 Score 0 0 0 0 0 0    Assessment:  Patient reports that he should be moving into his apartment by the weekend, "as soon as I get my television to work".  Patient will reside very close to current caregiver.  New address [name], [name] .  Patient receiving Meals on Wheels and has received a call from the Department of Social Services and was told that he was at the top of the list for a CAP (community alternatives program)  However he is still several months away from being assigned an aid through this program.  Patient mentioned having sores on his buttocks that were healing, per patient from sitting around all day.  He refused to allow his caregiver to see them.  This Education officer, museum emphasized the importance of allowing  her to see his wounds, to make sure that he did not need additional care.  Patient no longer receiving HH, services have ended at this time. RNCM notified of sores on buttocks and suggested that his male caregiver Stephen [name] look to check for bed sores.  She will also follow up during her next visit.  Plan:  This Education officer, museum will complete the next visit at patient's new address to ensure that he has what he needs to care for himself independently.     [name] Management [phone]

## 2016-01-01 ENCOUNTER — Ambulatory Visit (INDEPENDENT_AMBULATORY_CARE_PROVIDER_SITE_OTHER): Payer: PPO | Admitting: Internal Medicine

## 2016-01-01 ENCOUNTER — Encounter: Payer: Self-pay | Admitting: Internal Medicine

## 2016-01-01 VITALS — BP 122/80 | HR 93 | Resp 16 | Ht 70.0 in | Wt 106.0 lb

## 2016-01-01 DIAGNOSIS — E1100 Type 2 diabetes mellitus with hyperosmolarity without nonketotic hyperglycemic-hyperosmolar coma (NKHHC): Secondary | ICD-10-CM | POA: Diagnosis not present

## 2016-01-01 DIAGNOSIS — L409 Psoriasis, unspecified: Secondary | ICD-10-CM

## 2016-01-01 DIAGNOSIS — K2980 Duodenitis without bleeding: Secondary | ICD-10-CM

## 2016-01-01 DIAGNOSIS — E46 Unspecified protein-calorie malnutrition: Secondary | ICD-10-CM | POA: Diagnosis not present

## 2016-01-01 DIAGNOSIS — I739 Peripheral vascular disease, unspecified: Secondary | ICD-10-CM | POA: Diagnosis not present

## 2016-01-01 MED ORDER — MOMETASONE FUROATE 0.1 % EX CREA: TOPICAL_CREAM | Freq: Every day | CUTANEOUS | 3 refills | Status: DC

## 2016-01-01 NOTE — Progress Notes (Signed)
Date:  01/01/2016   Name:  Stephen [name].   DOB:  [birth year]   MRN:  [id number]   Chief Complaint: Mass (on R and L hands) Rash  This is a chronic problem. The problem has been gradually improving since onset. The affected locations include the left hand, right hand, left foot and right foot. The rash is characterized by blistering, dryness and peeling. Pertinent negatives include no cough, fatigue, fever or shortness of breath. Treatments tried: cortisone cream and tea tree oil/coconut oil herbal mix from his frient.  Diabetes  He presents for his follow-up diabetic visit. He has type 2 diabetes mellitus. Pertinent negatives for hypoglycemia include no dizziness or headaches. Pertinent negatives for diabetes include no chest pain and no fatigue.  BS running 100-130.  He is not taking insulin but continues on metformin. He recently moved into his own apartment.  He will be getting a home care aid for a few hours per day in the near future. PVD - no pain or open skin lesions.  He has hyperpigmentation of both lower legs and feet which he does not like.  He is not very active so does not know the limits of his endurance at this time. Duodenitis - on PPI daily.  He denies pain, n/v or gerd.  He does have frequent loose stools without blood or mucus.  Review of Systems  Constitutional: Negative for chills, fatigue and fever.  Respiratory: Negative for cough, choking, chest tightness and shortness of breath.   Cardiovascular: Negative for chest pain, palpitations and leg swelling.  Musculoskeletal: Negative for arthralgias.  Skin: Positive for rash.  Neurological: Negative for dizziness and headaches.  Psychiatric/Behavioral: Negative for dysphoric mood and self-injury.    Patient Active Problem List   Diagnosis Date Noted  . PVD (peripheral vascular disease) ([place]) 11/17/2015  . Hx of falling 11/17/2015  . Protein-calorie malnutrition, severe 11/09/2015  . Moderate alcohol dependence  ([place]) 09/12/2015  . Duodenitis 05/06/2015  . Hyponatremia 12/15/2014  . Thrombocytopenia ([place]) 12/15/2014  . Malnutrition ([place]) 12/15/2014  . Tobacco abuse 12/15/2014  . Cholelithiasis 12/15/2014  . Delusional disorder, unspecified type 12/14/2014  . Diabetes mellitus type 2, uncontrolled ([place]) 09/18/2014  . Psoriasis 09/16/2014  . Excessive weight loss 09/16/2014    Prior to Admission medications   Medication Sig Start Date End Date Taking? Authorizing Provider  B Complex Vitamins (VITAMIN B COMPLEX PO) Take by mouth. 10/07/15  Yes Historical Provider, MD  clobetasol ointment (TEMOVATE) 0.05 % Apply topically 2 (two) times daily. 11/17/15  Yes Glean Hess, MD  clotrimazole (LOTRIMIN) 1 % cream Apply topically. 10/07/15 10/06/16 Yes Historical Provider, MD  [facility]S ALLERGY 25 MG capsule TAKE 1 CAPSULE (25 MG TOTAL) BY MOUTH EVERY 4 (FOUR) HOURS AS NEEDED FOR ITCHING. 11/11/15  Yes Glean Hess, MD  feeding supplement, GLUCERNA SHAKE, (GLUCERNA SHAKE) LIQD Take 237 mLs by mouth 3 (three) times daily between meals. 07/31/15  Yes Glean Hess, MD  folic acid (FOLVITE) 1 MG tablet Take 1 mg by mouth daily.   Yes Historical Provider, MD  hydrOXYzine (ATARAX/VISTARIL) 25 MG tablet Take 25 mg by mouth. 10/07/15  Yes Historical Provider, MD  metFORMIN (GLUCOPHAGE) 500 MG tablet Take 1 tablet (500 mg total) by mouth 3 (three) times daily with meals. 07/31/15  Yes Glean Hess, MD  mometasone (ELOCON) 0.1 % cream Apply topically daily. 07/31/15  Yes Glean Hess, MD  pantoprazole (PROTONIX) 40 MG tablet Take 1 tablet (40  mg total) by mouth daily. 07/31/15  Yes Glean Hess, MD  triamcinolone (KENALOG) 0.025 % cream Apply 1 application topically 2 (two) times daily.   Yes Historical Provider, MD    No Known Allergies  Past Surgical History:  Procedure Laterality Date  . COLONOSCOPY  1995   one polyp  . ESOPHAGOGASTRODUODENOSCOPY N/A 05/02/2015   Procedure: ESOPHAGOGASTRODUODENOSCOPY  (EGD);  Surgeon: Hulen Luster, MD;  [facility] END[facility]COPY;  Service: Endoscopy;  Laterality: N/A;    Social History  Substance Use Topics  . Smoking status: Current Every Day Smoker    Packs/day: 1.00    Types: Cigarettes  . Smokeless tobacco: Former Systems developer     Comment: patient not ready to quit  . Alcohol use 0.0 oz/week     Comment: occassional, socially      Medication list has been reviewed and updated.   Physical Exam  Constitutional: He is oriented to person, place, and time. Vital signs are normal. He appears cachectic. No distress.  HENT:  Head: Normocephalic and atraumatic.  Cardiovascular: Normal rate, regular rhythm and normal heart sounds.   Pulses:      Dorsalis pedis pulses are 1+ on the right side, and 1+ on the left side.  Pulmonary/Chest: Effort normal and breath sounds normal. No respiratory distress.  Musculoskeletal: Normal range of motion.  Neurological: He is alert and oriented to person, place, and time.  Skin: Skin is warm and dry. No rash noted.  Hands and feet - hyperpigmentation noted, mild peeling but no drainage, odor or purulence.   All nails dystrophic.  No tenderness noted. Mild hypertrophic skin in scattered areas on palms - are of concern to patient do not appear infected  Psychiatric: He has a normal mood and affect. His behavior is normal. Thought content normal.  Nursing note and vitals reviewed.   BP 122/80   Pulse 93   Resp 16   Ht 5\' 10"  (1.778 m)   Wt 106 lb (48.1 kg)   SpO2 97%   BMI 15.21 kg/m   Assessment and Plan: 1. Uncontrolled type 2 diabetes mellitus with hyperosmolarity without coma, without long-term current use of insulin (HCC) Continue metformin; increase intake Persistently declines daily insulin - Hemoglobin A1c - Comprehensive metabolic panel  2. Psoriasis Continue topical agents and herbal oils No indication for antibiotics at this time - if purulent drainage noted, call for Rx (in the next few weeks) -  mometasone (ELOCON) 0.1 % cream; Apply topically daily.  Dispense: 60 g; Refill: 3  3. PVD (peripheral vascular disease) ([place]) stable  4. Malnutrition ([place]) Will continue to monitor  5. Duodenitis Continue PPI  [name], MD [facility]  01/01/2016

## 2016-01-02 LAB — COMPREHENSIVE METABOLIC PANEL
A/G RATIO: 1 — AB (ref 1.2–2.2)
ALT: 9 IU/L (ref 0–44)
AST: 16 IU/L (ref 0–40)
Albumin: 3.8 g/dL (ref 3.5–4.8)
Alkaline Phosphatase: 163 IU/L — ABNORMAL HIGH (ref 39–117)
BUN/Creatinine Ratio: 8 — ABNORMAL LOW (ref 10–24)
BUN: 6 mg/dL — AB (ref 8–27)
Bilirubin Total: 0.3 mg/dL (ref 0.0–1.2)
CALCIUM: 9.5 mg/dL (ref 8.6–10.2)
CHLORIDE: 102 mmol/L (ref 96–106)
CO2: 20 mmol/L (ref 18–29)
Creatinine, Ser: 0.75 mg/dL — ABNORMAL LOW (ref 0.76–1.27)
GFR, EST AFRICAN AMERICAN: 107 mL/min/{1.73_m2} (ref >59)
GFR, EST NON AFRICAN AMERICAN: 92 mL/min/{1.73_m2} (ref >59)
GLUCOSE: 127 mg/dL — AB (ref 65–99)
Globulin, Total: 4 g/dL (ref 1.5–4.5)
POTASSIUM: 4.6 mmol/L (ref 3.5–5.2)
Sodium: 139 mmol/L (ref 134–144)
TOTAL PROTEIN: 7.8 g/dL (ref 6.0–8.5)

## 2016-01-02 LAB — HEMOGLOBIN A1C
ESTIMATED AVERAGE GLUCOSE: 157 mg/dL
HEMOGLOBIN A1C: 7.1 % — AB (ref 4.8–5.6)

## 2016-01-05 ENCOUNTER — Other Ambulatory Visit: Payer: Self-pay | Admitting: Internal Medicine

## 2016-01-05 DIAGNOSIS — L409 Psoriasis, unspecified: Secondary | ICD-10-CM

## 2016-01-05 MED ORDER — CEPHALEXIN 500 MG PO CAPS: 500.0000 mg | ORAL_CAPSULE | Freq: Four times a day (QID) | ORAL | 0 refills | Status: AC

## 2016-01-08 ENCOUNTER — Other Ambulatory Visit: Payer: Self-pay | Admitting: *Deleted

## 2016-01-08 NOTE — Patient Outreach (Signed)
[facility]) Care Management   01/08/2016  Stephen [name]. 13-Jun-1945 [id number]  Stephen [name]. is an 70 y.o. male  Subjective:  Pt reports recently moved into new apartment, still need to get furniture from storage. Pt reports on recent office visit with Dr. Judithann Graves, continue to take Metformin as ordered, started on  Antibiotic for his hands/feet.  Pt reports sugars run 87.90's, on day in 200's but recheck went down to 80's.   Pt reports he is currently not drinking, knows it is not good for him/bring sugar up. Pt reports  His diabetes came from being on steroids for his skin disease so many years, now doing what he  Suppose to do.      Objective:   Vitals:   01/08/16 1508  BP: 100/70  Resp: 20    ROS  Physical Exam  Constitutional: He is oriented to person, place, and time.  Thin gentleman   Cardiovascular: Regular rhythm.   HR 56-108   Respiratory: Effort normal and breath sounds normal.  GI: Soft. Bowel sounds are normal.  Musculoskeletal: Normal range of motion.  Neurological: He is alert and oriented to person, place, and time.  Skin: Skin is warm and dry.  Psychiatric: He has a normal mood and affect. His behavior is normal. Judgment and thought content normal.    Encounter Medications:   Outpatient Encounter Prescriptions as of 01/08/2016  Medication Sig Note  . cephALEXin (KEFLEX) 500 MG capsule Take 1 capsule (500 mg total) by mouth 4 (four) times daily.   . feeding supplement, GLUCERNA SHAKE, (GLUCERNA SHAKE) LIQD Take 237 mLs by mouth 3 (three) times daily between meals. 11/25/2015: Taking once a day   . hydrOXYzine (ATARAX/VISTARIL) 25 MG tablet Take 25 mg by mouth. 11/17/2015: Received from: [facility] Received Sig: Take 1 tablet (25 mg total) by mouth every six (6) hours as needed (itching).  . metFORMIN (GLUCOPHAGE) 500 MG tablet Take 1 tablet (500 mg total) by mouth 3 (three) times daily with meals. 11/10/2015: Pt to pick up today  and start taking.   . mometasone (ELOCON) 0.1 % cream Apply topically daily.   . B Complex Vitamins (VITAMIN B COMPLEX PO) Take by mouth. 11/17/2015: Received from: [facility] Received Sig: Take 1 capsule by mouth daily.  . clobetasol ointment (TEMOVATE) 0.05 % Apply topically 2 (two) times daily. (Patient not taking: Reported on 01/08/2016)   . clotrimazole (LOTRIMIN) 1 % cream Apply topically. 11/17/2015: Received from: [facility] Received Sig: Apply topically Two (2) times a day.  . [employer] ALLERGY 25 MG capsule TAKE 1 CAPSULE (25 MG TOTAL) BY MOUTH EVERY 4 (FOUR) HOURS AS NEEDED FOR ITCHING. (Patient not taking: Reported on 01/08/2016) 11/25/2015: Per pt, uses 3-4 times a day   . folic acid (FOLVITE) 1 MG tablet Take 1 mg by mouth daily.   . pantoprazole (PROTONIX) 40 MG tablet Take 1 tablet (40 mg total) by mouth daily. (Patient not taking: Reported on 01/08/2016)    No facility-administered encounter medications on file as of 01/08/2016.     Functional Status:   In your present state of health, do you have any difficulty performing the following activities: 11/07/2015 11/06/2015  Hearing? N N  Vision? N N  Difficulty concentrating or making decisions? N N  Walking or climbing stairs? Y Y  Dressing or bathing? Y Y  Doing errands, shopping? Stephen [name]  Preparing Food and eating ? - Y  Using the Toilet? -  N  In the past six months, have you accidently leaked urine? - Y  Do you have problems with loss of bowel control? - N  Managing your Medications? - N  Managing your Finances? - N  Housekeeping or managing your Housekeeping? - Y  Some recent data might be hidden    Fall/Depression Screening:    PHQ 2/9 Scores 12/18/2015 11/17/2015 11/12/2015 05/23/2015 09/16/2014 09/16/2014  PHQ - 2 Score 0 0 0 0 0 0    Assessment:  Pleasant 70 year old gentleman, did home visit in his new apartment (furnishings sparse).                          DM- per pt checking sugars twice a day, compliant with Metformin.                            Low BMI- per pt eating 4 times a day, thinks not eating enough (needs dentures) most recent                            weight 110 lbs (MD office visit).                           Plan:    As discussed with pt, plan to discharge pt from community nurse case management services- goals               Met.               Plan to inform [facility]  Clinical SW of discharge.              [facility] CM Care Plan Problem Two   Flowsheet Row Most Recent Value  Care Plan Problem Two  DM- self management of disease.   Role Documenting the Problem Two  Care Management Coordinator  Care Plan for Problem Two  Active  [facility] Long Term Goal (31-90) days  Pt would maintain management of DM in the next 60 days   [facility] Long Term Goal Start Date  12/11/15  [facility] Long Term Goal Met Date  01/08/16  [facility] CM Short Term Goal #1 (0-30 days)  Pt would continue to check sugars, record for the next 30 days   [facility] CM Short Term Goal #1 Start Date  12/11/15  [facility] CM Short Term Goal #1 Met Date   01/08/16 [met- pt checking sugars on his own, was able to recall ]  [facility] CM Short Term Goal #2 (0-30 days)  Pt's sugars would continue to stay within normal limits in the next 30 days   [facility] CM Short Term Goal #2 Start Date  12/11/15  [facility] CM Short Term Goal #2 Met Date  01/08/16 [per pt-range 87- 100's,one time in 200's but recheck in 80s']  Interventions for Short Term Goal #2  Discussed with pt blood sugar readings.      Stephen [name].   [place] Care Management  [phone]

## 2016-01-13 ENCOUNTER — Encounter: Payer: Self-pay | Admitting: *Deleted

## 2016-01-16 ENCOUNTER — Encounter: Payer: Self-pay | Admitting: *Deleted

## 2016-01-16 ENCOUNTER — Other Ambulatory Visit: Payer: Self-pay | Admitting: *Deleted

## 2016-01-16 NOTE — Patient Outreach (Addendum)
[facility]) Care Management  [facility] Social Work  01/16/2016  Berle Vollrath. 1945/07/15 [id number]  Subjective:    Objective:   Encounter Medications:  Outpatient Encounter Prescriptions as of 01/16/2016  Medication Sig Note  . B Complex Vitamins (VITAMIN B COMPLEX PO) Take by mouth. 11/17/2015: Received from: [place]: Take 1 capsule by mouth daily.  . cephALEXin (KEFLEX) 500 MG capsule Take 1 capsule (500 mg total) by mouth 4 (four) times daily.   . clobetasol ointment (TEMOVATE) 0.05 % Apply topically 2 (two) times daily. (Patient not taking: Reported on 01/08/2016)   . clotrimazole (LOTRIMIN) 1 % cream Apply topically. 11/17/2015: Received from: [place]: Apply topically Two (2) times a day.  . CVS ALLERGY 25 MG capsule TAKE 1 CAPSULE (25 MG TOTAL) BY MOUTH EVERY 4 (FOUR) HOURS AS NEEDED FOR ITCHING. (Patient not taking: Reported on 01/08/2016) 11/25/2015: Per pt, uses 3-4 times a day   . feeding supplement, GLUCERNA SHAKE, (GLUCERNA SHAKE) LIQD Take 237 mLs by mouth 3 (three) times daily between meals. 11/25/2015: Taking once a day   . folic acid (FOLVITE) 1 MG tablet Take 1 mg by mouth daily.   . hydrOXYzine (ATARAX/VISTARIL) 25 MG tablet Take 25 mg by mouth. 11/17/2015: Received from: [place]: Take 1 tablet (25 mg total) by mouth every six (6) hours as needed (itching).  . metFORMIN (GLUCOPHAGE) 500 MG tablet Take 1 tablet (500 mg total) by mouth 3 (three) times daily with meals. 11/10/2015: Pt to pick up today and start taking.   . mometasone (ELOCON) 0.1 % cream Apply topically daily.   . pantoprazole (PROTONIX) 40 MG tablet Take 1 tablet (40 mg total) by mouth daily. (Patient not taking: Reported on 01/08/2016)    No facility-administered encounter medications on file as of 01/16/2016.     Functional Status:  In your present state of health, do you have any difficulty performing the following activities:  11/07/2015 11/06/2015  Hearing? N N  Vision? N N  Difficulty concentrating or making decisions? N N  Walking or climbing stairs? Y Y  Dressing or bathing? Y Y  Doing errands, shopping? [name]  Preparing Food and eating ? - Y  Using the Toilet? - N  In the past six months, have you accidently leaked urine? - Y  Do you have problems with loss of bowel control? - N  Managing your Medications? - N  Managing your Finances? - N  Housekeeping or managing your Housekeeping? - Y  Some recent data might be hidden    Fall/Depression Screening:  PHQ 2/9 Scores 01/16/2016 12/18/2015 11/17/2015 11/12/2015 05/23/2015 09/16/2014 09/16/2014  PHQ - 2 Score 0 0 0 0 0 0 0    Assessment: Patient reports moving into his new place for approximatley 2 weeks.  Per patient, he still needs to get his furniture out of storage with the help of family friends [place] and [name]. Patient reports doing well, currently not drinking alcohol.  Per patient, "I drink went I want to, but I don't want to right now".   Sherol Dade, family friend continues to check on patient every other day.  Patient continues to receive Meals on Wheels.  Per patient, case manager from the  [employer]  program for  Disabled Adults will be coming on Monday to complete assessment for in home support.  Per patient, he has enough food, and is taking his medication as prescribed.  Per patient,  he will follow up with Dr. Army Melia in approximately 3 months. Skin on feet very dry. Per patient, his feet were leaking fluid, MD put him on a antibiotic and the fluid is starting to dry up.  Patient has them covered with plastic bags. Patient verbalized having no further social work needs.   Late Entry:  As this Education officer, museum was leaving, a Transport planner from [employer] arrived. [place] clarified that patient will be receiving care management services from a grant program through [facility], not the [employer] for Disabled Adults that provides 6 hours per week of personal care services, a long with a monthly care management call and quarterly visit.  Patient will also receive a monthly visit from a nurse.    Plan: This social worker to close patient's case to [facility] care management.  Patient's provider's office to be notified.

## 2016-01-19 ENCOUNTER — Ambulatory Visit: Payer: Self-pay | Admitting: Internal Medicine

## 2016-01-20 ENCOUNTER — Encounter: Payer: Self-pay | Admitting: *Deleted

## 2016-02-09 ENCOUNTER — Other Ambulatory Visit: Payer: Self-pay | Admitting: Internal Medicine

## 2016-06-09 ENCOUNTER — Other Ambulatory Visit: Payer: Self-pay | Admitting: Internal Medicine

## 2016-06-14 DIAGNOSIS — F17210 Nicotine dependence, cigarettes, uncomplicated: Secondary | ICD-10-CM | POA: Diagnosis not present

## 2016-06-14 DIAGNOSIS — Z7984 Long term (current) use of oral hypoglycemic drugs: Secondary | ICD-10-CM | POA: Insufficient documentation

## 2016-06-14 DIAGNOSIS — Z5321 Procedure and treatment not carried out due to patient leaving prior to being seen by health care provider: Secondary | ICD-10-CM | POA: Insufficient documentation

## 2016-06-14 DIAGNOSIS — E119 Type 2 diabetes mellitus without complications: Secondary | ICD-10-CM | POA: Diagnosis not present

## 2016-06-14 DIAGNOSIS — R21 Rash and other nonspecific skin eruption: Secondary | ICD-10-CM | POA: Insufficient documentation

## 2016-06-15 ENCOUNTER — Emergency Department
Admission: EM | Admit: 2016-06-15 | Discharge: 2016-06-15 | Disposition: A | Discharge status: Home / Self Care | Payer: PPO | Attending: Emergency Medicine | Admitting: Emergency Medicine

## 2016-06-15 ENCOUNTER — Encounter: Payer: Self-pay | Admitting: Emergency Medicine

## 2016-06-15 NOTE — ED Triage Notes (Signed)
Pt presents to ED with excessively dry cracking peeling skin that is painful and "oozing'. Pt states his skin has gotten so bad that his feet are starting to swell. Onset of skin condition started in 2008. Pt states the past 2 days his symptoms have worsened.

## 2016-06-16 ENCOUNTER — Other Ambulatory Visit: Payer: Self-pay | Admitting: Internal Medicine

## 2016-06-16 ENCOUNTER — Emergency Department
Admission: EM | Admit: 2016-06-16 | Discharge: 2016-06-16 | Disposition: A | Discharge status: Home / Self Care | Payer: PPO | Attending: Emergency Medicine | Admitting: Emergency Medicine

## 2016-06-16 DIAGNOSIS — E119 Type 2 diabetes mellitus without complications: Secondary | ICD-10-CM | POA: Diagnosis not present

## 2016-06-16 DIAGNOSIS — R21 Rash and other nonspecific skin eruption: Secondary | ICD-10-CM | POA: Insufficient documentation

## 2016-06-16 DIAGNOSIS — L089 Local infection of the skin and subcutaneous tissue, unspecified: Secondary | ICD-10-CM | POA: Diagnosis not present

## 2016-06-16 DIAGNOSIS — Z7984 Long term (current) use of oral hypoglycemic drugs: Secondary | ICD-10-CM | POA: Diagnosis not present

## 2016-06-16 DIAGNOSIS — L299 Pruritus, unspecified: Secondary | ICD-10-CM | POA: Diagnosis not present

## 2016-06-16 DIAGNOSIS — F17210 Nicotine dependence, cigarettes, uncomplicated: Secondary | ICD-10-CM | POA: Insufficient documentation

## 2016-06-16 LAB — COMPREHENSIVE METABOLIC PANEL
ALK PHOS: 196 U/L — AB (ref 38–126)
ALT: 36 U/L (ref 17–63)
ANION GAP: 14 (ref 5–15)
AST: 55 U/L — ABNORMAL HIGH (ref 15–41)
Albumin: 2.7 g/dL — ABNORMAL LOW (ref 3.5–5.0)
BILIRUBIN TOTAL: 0.7 mg/dL (ref 0.3–1.2)
BUN: 16 mg/dL (ref 6–20)
CALCIUM: 7.9 mg/dL — AB (ref 8.9–10.3)
CO2: 21 mmol/L — ABNORMAL LOW (ref 22–32)
CREATININE: 0.88 mg/dL (ref 0.61–1.24)
Chloride: 103 mmol/L (ref 101–111)
GFR calc Af Amer: >60 mL/min (ref >60)
GFR calc non Af Amer: >60 mL/min (ref >60)
Glucose, Bld: 193 mg/dL — ABNORMAL HIGH (ref 65–99)
Potassium: 4 mmol/L (ref 3.5–5.1)
Sodium: 138 mmol/L (ref 135–145)
TOTAL PROTEIN: 5.9 g/dL — AB (ref 6.5–8.1)

## 2016-06-16 LAB — CBC
HEMATOCRIT: 32.8 % — AB (ref 40.0–52.0)
HEMOGLOBIN: 11.1 g/dL — AB (ref 13.0–18.0)
MCH: 31.4 pg (ref 26.0–34.0)
MCHC: 33.8 g/dL (ref 32.0–36.0)
MCV: 92.8 fL (ref 80.0–100.0)
Platelets: 197 10*3/uL (ref 150–440)
RBC: 3.53 MIL/uL — AB (ref 4.40–5.90)
RDW: 19.5 % — ABNORMAL HIGH (ref 11.5–14.5)
WBC: 9 10*3/uL (ref 3.8–10.6)

## 2016-06-16 MED ORDER — SODIUM CHLORIDE 0.9 % IV BOLUS (SEPSIS)
1000.0000 mL | Freq: Once | INTRAVENOUS | Status: AC
  Administered 2016-06-16: 1000 mL via INTRAVENOUS

## 2016-06-16 NOTE — ED Notes (Signed)
Patient denies pain and is resting comfortably.  

## 2016-06-16 NOTE — ED Triage Notes (Addendum)
Pt presents to ED via [facility] from home. Pt states 8 years itchy and dry skin. Pt skin seems cracked on feet. 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 States last 2 days hands  And feet have been weeping.

## 2016-06-16 NOTE — ED Notes (Signed)
Pt states he has been losing weight for a while now. Pt states he doesn't want to take care of himself anymore. States he has a home care nurse that comes a couple hours a day. Pt states he can't eat anymore because "I don't have no damn teeth." Pt only c/o at this time is that he is cold, given warm blankets by Rubin Payor

## 2016-06-16 NOTE — ED Notes (Signed)
AAOx3.  NAD

## 2016-06-16 NOTE — ED Notes (Signed)
Patient's grandson at bedside.  States he drove to hospital from [facility].  Patient states he wishes to leave hospital now.  Discussed ordered NS and encouraged patient to stay and finish liter of fluid.  Patient states understanding but still wises to leave hospital AMA now.  Dr. Corky Downs alerted, no further action ordered.

## 2016-06-16 NOTE — ED Provider Notes (Signed)
[facility] Emergency Department Provider Note   ____________________________________________    I have reviewed the triage vital signs and the nursing notes.   HISTORY  Chief Complaint dry skin (dry skin) and Pruritis     HPI Stephen [name]. is a 71 y.o. male who presents with complaints of dry itchy skin. Patient has a history of chronic psoriasis. Has been seen here for similar complaints in the past. He denies fevers or chills. No discharge reported. No erythema or redness or pain. No significant change in symptoms. EMS was concerned about a possible parasite coming out of his chin but he denies this   Past Medical History:  Diagnosis Date  . Diabetes mellitus without complication ([place])   . Psoriasis   . Skin abnormalities     Patient Active Problem List   Diagnosis Date Noted  . PVD (peripheral vascular disease) ([place]) 11/17/2015  . Hx of falling 11/17/2015  . Protein-calorie malnutrition, severe 11/09/2015  . Moderate alcohol dependence ([place]) 09/12/2015  . Duodenitis 05/06/2015  . Hyponatremia 12/15/2014  . Thrombocytopenia ([place]) 12/15/2014  . Malnutrition ([place]) 12/15/2014  . Tobacco abuse 12/15/2014  . Cholelithiasis 12/15/2014  . Delusional disorder, unspecified type 12/14/2014  . Diabetes mellitus type 2, uncontrolled ([place]) 09/18/2014  . Psoriasis 09/16/2014  . Excessive weight loss 09/16/2014    Past Surgical History:  Procedure Laterality Date  . COLONOSCOPY  1995   one polyp  . ESOPHAGOGASTRODUODENOSCOPY N/A 05/02/2015   Procedure: ESOPHAGOGASTRODUODENOSCOPY (EGD);  Surgeon: Hulen Luster, MD;  Location: [facility] ENDOSCOPY;  Service: Endoscopy;  Laterality: N/A;    Prior to Admission medications   Medication Sig Start Date End Date Taking? Authorizing Provider  B Complex Vitamins (VITAMIN B COMPLEX PO) Take by mouth. 10/07/15   Historical Provider, MD  clobetasol ointment (TEMOVATE) 0.05 % Apply topically 2 (two) times  daily. Patient not taking: Reported on 01/08/2016 11/17/15   Glean Hess, MD  clotrimazole (LOTRIMIN) 1 % cream Apply topically. 10/07/15 10/06/16  Historical Provider, MD  [employer]S ALLERGY 25 MG capsule TAKE 1 CAPSULE (25 MG TOTAL) BY MOUTH EVERY 4 (FOUR) HOURS AS NEEDED FOR ITCHING. 06/09/16   Glean Hess, MD  feeding supplement, GLUCERNA SHAKE, (GLUCERNA SHAKE) LIQD Take 237 mLs by mouth 3 (three) times daily between meals. 07/31/15   Glean Hess, MD  folic acid (FOLVITE) 1 MG tablet Take 1 mg by mouth daily.    Historical Provider, MD  hydrOXYzine (ATARAX/VISTARIL) 25 MG tablet Take 25 mg by mouth. 10/07/15   Historical Provider, MD  metFORMIN (GLUCOPHAGE) 500 MG tablet Take 1 tablet (500 mg total) by mouth 3 (three) times daily with meals. 07/31/15   Glean Hess, MD  metFORMIN (GLUCOPHAGE) 500 MG tablet TAKE 1 TABLET (500 MG TOTAL) BY MOUTH 3 (THREE) TIMES DAILY WITH MEALS. 02/09/16   Glean Hess, MD  mometasone (ELOCON) 0.1 % cream Apply topically daily. 01/01/16   Glean Hess, MD  pantoprazole (PROTONIX) 40 MG tablet Take 1 tablet (40 mg total) by mouth daily. Patient not taking: Reported on 01/08/2016 07/31/15   Glean Hess, MD     Allergies Patient has no known allergies.  Family History  Problem Relation Age of Onset  . Migraines Father     Social History Social History  Substance Use Topics  . Smoking status: Current Every Day Smoker    Packs/day: 1.00    Types: Cigarettes  . Smokeless tobacco: Former Systems developer  Comment: patient not ready to quit  . Alcohol use 0.0 oz/week     Comment: occassional, socially     Review of Systems  Constitutional: No fever/chills  Cardiovascular: Denies chest pain. Respiratory: Denies shortness of breath. Gastrointestinal: No abdominal pain.  No nausea, no vomiting.    Musculoskeletal: Negative for back pain. SkinChronic skin rash Neurological: Negative for headaches or weakness  10-point ROS otherwise  negative.  ____________________________________________   PHYSICAL EXAM:  VITAL SIGNS: ED Triage Vitals [06/16/16 1156]  Enc Vitals Group     BP 134/82     Pulse Rate (!) 107     Resp 20     Temp 97.5 F (36.4 C)     Temp Source Oral     SpO2 98 %     Weight 94 lb (42.6 kg)     Height 5\' 8"  (1.727 m)     Head Circumference      Peak Flow      Pain Score 0     Pain Loc      Pain Edu?      Excl. in [place]?     Constitutional: Alert and oriented. No acute distress. Pleasant and interactive Eyes: Conjunctivae are normal.   Nose: No congestion/rhinnorhea. Mouth/Throat: Mucous membranes are moist.    Cardiovascular: Normal rate, regular rhythm. Grossly normal heart sounds.  Good peripheral circulation. Respiratory: Normal respiratory effort.  No retractions. Lungs CTAB. Gastrointestinal: Soft and nontender. No distention.  No CVA tenderness. Genitourinary: deferred Musculoskeletal: No lower extremity tenderness nor edema.  Warm and well perfused Neurologic:  Normal speech and language. No gross focal neurologic deficits are appreciated.  Skin:  Skin is warm, dry. Patient with dry flaky skin most prominent on his upper and lower extremities. No evidence of infection, cellulitis, discharge, swelling on thorough exam Psychiatric: Mood and affect are normal. Speech and behavior are normal.  ____________________________________________   LABS (all labs ordered are listed, but only abnormal results are displayed)  Labs Reviewed  CBC - Abnormal; Notable for the following:       Result Value   RBC 3.53 (*)    Hemoglobin 11.1 (*)    HCT 32.8 (*)    RDW 19.5 (*)    All other components within normal limits  COMPREHENSIVE METABOLIC PANEL - Abnormal; Notable for the following:    CO2 21 (*)    Glucose, Bld 193 (*)    Calcium 7.9 (*)    Total Protein 5.9 (*)    Albumin 2.7 (*)    AST 55 (*)    Alkaline Phosphatase 196 (*)    All other components within normal limits    ____________________________________________  EKG  None ____________________________________________  RADIOLOGY  None ____________________________________________   PROCEDURES  Procedure(s) performed: No    Critical Care performed: No ____________________________________________   INITIAL IMPRESSION / ASSESSMENT AND PLAN / ED COURSE  Pertinent labs & imaging results that were available during my care of the patient were reviewed by me and considered in my medical decision making (see chart for details).  Patient's lab work is unremarkable, he is mildly tachycardic, we will give IV fluids. He tells me that he has called his family to come pick him up because he is ready to go  ----------------------------------------- 3:50 PM on 06/16/2016 -----------------------------------------  Patient's family is here and he insists on leaving despite having received 300 cc of fluid and his heart rate still being mildly elevated. He refuses to stay any longer, he knows  he can return any time    ____________________________________________   FINAL CLINICAL IMPRESSION(S) / ED DIAGNOSES  Final diagnoses:  Rash      NEW MEDICATIONS STARTED DURING THIS VISIT:  New Prescriptions   No medications on file     Note:  This document was prepared using Dragon voice recognition software and may include unintentional dictation errors.    Lavonia Drafts, MD 06/16/16 [phone]

## 2016-06-16 NOTE — Telephone Encounter (Signed)
Pt called wanted to be seen, but his feet are swollen and may be holding fluid and was told to go to the ER bc they can put an iv in him and take of the swelling

## 2016-06-20 DIAGNOSIS — R21 Rash and other nonspecific skin eruption: Secondary | ICD-10-CM | POA: Diagnosis not present

## 2016-06-20 DIAGNOSIS — L538 Other specified erythematous conditions: Secondary | ICD-10-CM | POA: Diagnosis not present

## 2016-06-20 DIAGNOSIS — L409 Psoriasis, unspecified: Secondary | ICD-10-CM | POA: Diagnosis not present

## 2016-06-20 DIAGNOSIS — A4901 Methicillin susceptible Staphylococcus aureus infection, unspecified site: Secondary | ICD-10-CM | POA: Diagnosis not present

## 2016-06-20 DIAGNOSIS — L308 Other specified dermatitis: Secondary | ICD-10-CM | POA: Diagnosis not present

## 2016-06-20 DIAGNOSIS — F1010 Alcohol abuse, uncomplicated: Secondary | ICD-10-CM | POA: Diagnosis not present

## 2016-06-20 DIAGNOSIS — R197 Diarrhea, unspecified: Secondary | ICD-10-CM | POA: Diagnosis not present

## 2016-06-20 DIAGNOSIS — R531 Weakness: Secondary | ICD-10-CM | POA: Diagnosis not present

## 2016-06-20 DIAGNOSIS — F17210 Nicotine dependence, cigarettes, uncomplicated: Secondary | ICD-10-CM | POA: Diagnosis not present

## 2016-06-20 DIAGNOSIS — K219 Gastro-esophageal reflux disease without esophagitis: Secondary | ICD-10-CM | POA: Diagnosis not present

## 2016-06-20 DIAGNOSIS — E639 Nutritional deficiency, unspecified: Secondary | ICD-10-CM | POA: Diagnosis not present

## 2016-06-20 DIAGNOSIS — R000 Tachycardia, unspecified: Secondary | ICD-10-CM | POA: Diagnosis not present

## 2016-06-20 DIAGNOSIS — E119 Type 2 diabetes mellitus without complications: Secondary | ICD-10-CM | POA: Diagnosis not present

## 2016-06-20 DIAGNOSIS — E43 Unspecified severe protein-calorie malnutrition: Secondary | ICD-10-CM | POA: Diagnosis not present

## 2016-06-20 DIAGNOSIS — L299 Pruritus, unspecified: Secondary | ICD-10-CM | POA: Diagnosis not present

## 2016-06-20 DIAGNOSIS — M79672 Pain in left foot: Secondary | ICD-10-CM | POA: Diagnosis not present

## 2016-06-20 DIAGNOSIS — L539 Erythematous condition, unspecified: Secondary | ICD-10-CM | POA: Diagnosis not present

## 2016-06-20 DIAGNOSIS — Z5189 Encounter for other specified aftercare: Secondary | ICD-10-CM | POA: Diagnosis not present

## 2016-06-20 DIAGNOSIS — Z794 Long term (current) use of insulin: Secondary | ICD-10-CM | POA: Diagnosis not present

## 2016-06-20 DIAGNOSIS — M6281 Muscle weakness (generalized): Secondary | ICD-10-CM | POA: Diagnosis not present

## 2016-06-20 DIAGNOSIS — M79671 Pain in right foot: Secondary | ICD-10-CM | POA: Diagnosis not present

## 2016-06-22 NOTE — Telephone Encounter (Signed)
S/w pt said he was in the hospital at [facility]

## 2016-06-23 DIAGNOSIS — L539 Erythematous condition, unspecified: Secondary | ICD-10-CM | POA: Insufficient documentation

## 2016-06-25 DIAGNOSIS — A4901 Methicillin susceptible Staphylococcus aureus infection, unspecified site: Secondary | ICD-10-CM | POA: Diagnosis not present

## 2016-06-25 DIAGNOSIS — L299 Pruritus, unspecified: Secondary | ICD-10-CM | POA: Diagnosis not present

## 2016-06-25 DIAGNOSIS — K219 Gastro-esophageal reflux disease without esophagitis: Secondary | ICD-10-CM | POA: Diagnosis not present

## 2016-06-25 DIAGNOSIS — F1010 Alcohol abuse, uncomplicated: Secondary | ICD-10-CM | POA: Diagnosis not present

## 2016-06-25 DIAGNOSIS — Z5189 Encounter for other specified aftercare: Secondary | ICD-10-CM | POA: Diagnosis not present

## 2016-06-25 DIAGNOSIS — E119 Type 2 diabetes mellitus without complications: Secondary | ICD-10-CM | POA: Diagnosis not present

## 2016-06-25 DIAGNOSIS — M6281 Muscle weakness (generalized): Secondary | ICD-10-CM | POA: Diagnosis not present

## 2016-06-25 DIAGNOSIS — L539 Erythematous condition, unspecified: Secondary | ICD-10-CM | POA: Diagnosis not present

## 2016-06-25 DIAGNOSIS — R197 Diarrhea, unspecified: Secondary | ICD-10-CM | POA: Diagnosis not present

## 2016-06-29 DIAGNOSIS — R197 Diarrhea, unspecified: Secondary | ICD-10-CM | POA: Diagnosis not present

## 2016-06-29 DIAGNOSIS — F1010 Alcohol abuse, uncomplicated: Secondary | ICD-10-CM | POA: Diagnosis not present

## 2016-06-29 DIAGNOSIS — F17210 Nicotine dependence, cigarettes, uncomplicated: Secondary | ICD-10-CM | POA: Diagnosis not present

## 2016-06-29 DIAGNOSIS — R262 Difficulty in walking, not elsewhere classified: Secondary | ICD-10-CM | POA: Diagnosis not present

## 2016-06-29 DIAGNOSIS — K219 Gastro-esophageal reflux disease without esophagitis: Secondary | ICD-10-CM | POA: Diagnosis not present

## 2016-06-29 DIAGNOSIS — E119 Type 2 diabetes mellitus without complications: Secondary | ICD-10-CM | POA: Diagnosis not present

## 2016-07-01 LAB — CBC AND DIFFERENTIAL
HCT: 26 % — AB (ref 41–53)
HEMOGLOBIN: 8.6 g/dL — AB (ref 13.5–17.5)
PLATELETS: 276 10*3/uL (ref 150–399)
WBC: 8.3 10*3/mL

## 2016-07-01 LAB — BASIC METABOLIC PANEL
BUN: 10 mg/dL (ref 4–21)
Creatinine: 0.7 mg/dL (ref 0.6–1.3)
Glucose: 81 mg/dL
Potassium: 4.3 mmol/L (ref 3.4–5.3)
Sodium: 138 mmol/L (ref 137–147)

## 2016-07-01 LAB — VITAMIN B12: Vitamin B-12: 833

## 2016-07-08 ENCOUNTER — Encounter: Payer: Self-pay | Admitting: Internal Medicine

## 2016-07-08 ENCOUNTER — Telehealth: Payer: Self-pay | Admitting: Internal Medicine

## 2016-07-08 ENCOUNTER — Inpatient Hospital Stay: Payer: PPO | Admitting: Internal Medicine

## 2016-07-08 NOTE — Telephone Encounter (Signed)
Pt had an appt this morning but decided he did not want to be seen pt said he would like to r/s due to patient not feeling well and felt dizzy asked patient if he did not want to wait could I wheelchair him downstairs to UC informed both nurses Roselyn Reef and Chassidy, [place] to check pts oxygen and heart rate level,level was fine heart rate a little high pt stated he was fine, I wheeled patient down stairs and sat with patient for a little bit and gave him water.

## 2016-07-12 DIAGNOSIS — Z7984 Long term (current) use of oral hypoglycemic drugs: Secondary | ICD-10-CM | POA: Diagnosis not present

## 2016-07-12 DIAGNOSIS — H6691 Otitis media, unspecified, right ear: Secondary | ICD-10-CM | POA: Diagnosis not present

## 2016-07-12 DIAGNOSIS — J439 Emphysema, unspecified: Secondary | ICD-10-CM | POA: Diagnosis not present

## 2016-07-12 DIAGNOSIS — E119 Type 2 diabetes mellitus without complications: Secondary | ICD-10-CM | POA: Diagnosis not present

## 2016-07-12 DIAGNOSIS — L539 Erythematous condition, unspecified: Secondary | ICD-10-CM | POA: Diagnosis not present

## 2016-07-12 DIAGNOSIS — E875 Hyperkalemia: Secondary | ICD-10-CM | POA: Diagnosis not present

## 2016-07-12 DIAGNOSIS — K909 Intestinal malabsorption, unspecified: Secondary | ICD-10-CM | POA: Diagnosis not present

## 2016-07-12 DIAGNOSIS — H6121 Impacted cerumen, right ear: Secondary | ICD-10-CM | POA: Diagnosis not present

## 2016-07-12 DIAGNOSIS — L409 Psoriasis, unspecified: Secondary | ICD-10-CM | POA: Diagnosis not present

## 2016-07-12 DIAGNOSIS — K521 Toxic gastroenteritis and colitis: Secondary | ICD-10-CM | POA: Diagnosis not present

## 2016-07-12 DIAGNOSIS — K861 Other chronic pancreatitis: Secondary | ICD-10-CM | POA: Diagnosis not present

## 2016-07-12 DIAGNOSIS — R197 Diarrhea, unspecified: Secondary | ICD-10-CM | POA: Diagnosis not present

## 2016-07-12 DIAGNOSIS — R21 Rash and other nonspecific skin eruption: Secondary | ICD-10-CM | POA: Diagnosis not present

## 2016-07-12 DIAGNOSIS — H60501 Unspecified acute noninfective otitis externa, right ear: Secondary | ICD-10-CM | POA: Diagnosis not present

## 2016-07-12 DIAGNOSIS — F1010 Alcohol abuse, uncomplicated: Secondary | ICD-10-CM | POA: Diagnosis not present

## 2016-07-12 DIAGNOSIS — F17210 Nicotine dependence, cigarettes, uncomplicated: Secondary | ICD-10-CM | POA: Diagnosis not present

## 2016-07-12 DIAGNOSIS — T383X5A Adverse effect of insulin and oral hypoglycemic [antidiabetic] drugs, initial encounter: Secondary | ICD-10-CM | POA: Diagnosis not present

## 2016-07-12 DIAGNOSIS — F1020 Alcohol dependence, uncomplicated: Secondary | ICD-10-CM | POA: Diagnosis not present

## 2016-07-12 DIAGNOSIS — L538 Other specified erythematous conditions: Secondary | ICD-10-CM | POA: Diagnosis not present

## 2016-07-12 DIAGNOSIS — L209 Atopic dermatitis, unspecified: Secondary | ICD-10-CM | POA: Diagnosis not present

## 2016-07-13 DIAGNOSIS — L308 Other specified dermatitis: Secondary | ICD-10-CM | POA: Diagnosis not present

## 2016-07-13 DIAGNOSIS — R197 Diarrhea, unspecified: Secondary | ICD-10-CM | POA: Diagnosis not present

## 2016-07-13 DIAGNOSIS — F1010 Alcohol abuse, uncomplicated: Secondary | ICD-10-CM | POA: Diagnosis not present

## 2016-07-13 DIAGNOSIS — E119 Type 2 diabetes mellitus without complications: Secondary | ICD-10-CM | POA: Diagnosis not present

## 2016-07-13 DIAGNOSIS — L539 Erythematous condition, unspecified: Secondary | ICD-10-CM | POA: Diagnosis not present

## 2016-07-14 ENCOUNTER — Inpatient Hospital Stay: Payer: PPO | Admitting: Internal Medicine

## 2016-07-14 DIAGNOSIS — F1010 Alcohol abuse, uncomplicated: Secondary | ICD-10-CM | POA: Diagnosis not present

## 2016-07-14 DIAGNOSIS — R197 Diarrhea, unspecified: Secondary | ICD-10-CM | POA: Diagnosis not present

## 2016-07-14 DIAGNOSIS — E119 Type 2 diabetes mellitus without complications: Secondary | ICD-10-CM | POA: Diagnosis not present

## 2016-07-14 DIAGNOSIS — L539 Erythematous condition, unspecified: Secondary | ICD-10-CM | POA: Diagnosis not present

## 2016-07-14 DIAGNOSIS — L308 Other specified dermatitis: Secondary | ICD-10-CM | POA: Diagnosis not present

## 2016-07-15 DIAGNOSIS — L539 Erythematous condition, unspecified: Secondary | ICD-10-CM | POA: Diagnosis not present

## 2016-07-15 DIAGNOSIS — E1169 Type 2 diabetes mellitus with other specified complication: Secondary | ICD-10-CM | POA: Diagnosis not present

## 2016-07-15 DIAGNOSIS — A498 Other bacterial infections of unspecified site: Secondary | ICD-10-CM | POA: Diagnosis not present

## 2016-07-15 DIAGNOSIS — L308 Other specified dermatitis: Secondary | ICD-10-CM | POA: Diagnosis not present

## 2016-07-15 DIAGNOSIS — R197 Diarrhea, unspecified: Secondary | ICD-10-CM | POA: Diagnosis not present

## 2016-07-15 DIAGNOSIS — F1010 Alcohol abuse, uncomplicated: Secondary | ICD-10-CM | POA: Diagnosis not present

## 2016-07-15 DIAGNOSIS — K529 Noninfective gastroenteritis and colitis, unspecified: Secondary | ICD-10-CM | POA: Diagnosis not present

## 2016-07-16 DIAGNOSIS — R197 Diarrhea, unspecified: Secondary | ICD-10-CM | POA: Diagnosis not present

## 2016-07-16 DIAGNOSIS — F1010 Alcohol abuse, uncomplicated: Secondary | ICD-10-CM | POA: Diagnosis not present

## 2016-07-16 DIAGNOSIS — R590 Localized enlarged lymph nodes: Secondary | ICD-10-CM | POA: Diagnosis not present

## 2016-07-16 DIAGNOSIS — J439 Emphysema, unspecified: Secondary | ICD-10-CM | POA: Diagnosis not present

## 2016-07-16 DIAGNOSIS — E119 Type 2 diabetes mellitus without complications: Secondary | ICD-10-CM | POA: Diagnosis not present

## 2016-07-16 DIAGNOSIS — J90 Pleural effusion, not elsewhere classified: Secondary | ICD-10-CM | POA: Diagnosis not present

## 2016-07-16 DIAGNOSIS — L539 Erythematous condition, unspecified: Secondary | ICD-10-CM | POA: Diagnosis not present

## 2016-07-17 DIAGNOSIS — L539 Erythematous condition, unspecified: Secondary | ICD-10-CM | POA: Diagnosis not present

## 2016-07-17 DIAGNOSIS — F1010 Alcohol abuse, uncomplicated: Secondary | ICD-10-CM | POA: Diagnosis not present

## 2016-07-17 DIAGNOSIS — E1169 Type 2 diabetes mellitus with other specified complication: Secondary | ICD-10-CM | POA: Diagnosis not present

## 2016-07-17 DIAGNOSIS — H6091 Unspecified otitis externa, right ear: Secondary | ICD-10-CM | POA: Diagnosis not present

## 2016-07-17 DIAGNOSIS — R197 Diarrhea, unspecified: Secondary | ICD-10-CM | POA: Diagnosis not present

## 2016-07-18 DIAGNOSIS — L539 Erythematous condition, unspecified: Secondary | ICD-10-CM | POA: Diagnosis not present

## 2016-07-18 DIAGNOSIS — H6091 Unspecified otitis externa, right ear: Secondary | ICD-10-CM | POA: Diagnosis not present

## 2016-07-18 DIAGNOSIS — E1169 Type 2 diabetes mellitus with other specified complication: Secondary | ICD-10-CM | POA: Diagnosis not present

## 2016-07-18 DIAGNOSIS — F1010 Alcohol abuse, uncomplicated: Secondary | ICD-10-CM | POA: Diagnosis not present

## 2016-07-18 DIAGNOSIS — R197 Diarrhea, unspecified: Secondary | ICD-10-CM | POA: Diagnosis not present

## 2016-07-19 DIAGNOSIS — L409 Psoriasis, unspecified: Secondary | ICD-10-CM | POA: Diagnosis not present

## 2016-07-19 DIAGNOSIS — L539 Erythematous condition, unspecified: Secondary | ICD-10-CM | POA: Diagnosis not present

## 2016-07-19 DIAGNOSIS — E119 Type 2 diabetes mellitus without complications: Secondary | ICD-10-CM | POA: Diagnosis not present

## 2016-07-19 DIAGNOSIS — F1020 Alcohol dependence, uncomplicated: Secondary | ICD-10-CM | POA: Diagnosis not present

## 2016-07-20 ENCOUNTER — Inpatient Hospital Stay: Payer: PPO | Admitting: Internal Medicine

## 2016-07-20 ENCOUNTER — Telehealth: Payer: Self-pay | Admitting: *Deleted

## 2016-07-20 NOTE — Telephone Encounter (Addendum)
Patient called and states he got d/c from the hospital on some medications. He has a question about one of the medication that he was prescribed . He is requesting a call back . His number is [phone]. Thank you

## 2016-07-20 NOTE — Telephone Encounter (Signed)
Called pt and spoke to him. Thank you.

## 2016-07-21 ENCOUNTER — Ambulatory Visit (INDEPENDENT_AMBULATORY_CARE_PROVIDER_SITE_OTHER): Payer: PPO | Admitting: Internal Medicine

## 2016-07-21 ENCOUNTER — Encounter: Payer: Self-pay | Admitting: Internal Medicine

## 2016-07-21 VITALS — BP 158/90 | HR 86 | Resp 14 | Ht 67.0 in | Wt 117.0 lb

## 2016-07-21 DIAGNOSIS — L539 Erythematous condition, unspecified: Secondary | ICD-10-CM | POA: Diagnosis not present

## 2016-07-21 DIAGNOSIS — E1100 Type 2 diabetes mellitus with hyperosmolarity without nonketotic hyperglycemic-hyperosmolar coma (NKHHC): Secondary | ICD-10-CM | POA: Diagnosis not present

## 2016-07-21 DIAGNOSIS — I739 Peripheral vascular disease, unspecified: Secondary | ICD-10-CM

## 2016-07-21 DIAGNOSIS — H6501 Acute serous otitis media, right ear: Secondary | ICD-10-CM

## 2016-07-21 MED ORDER — GLUCOSE BLOOD VI STRP: ORAL_STRIP | 12 refills | Status: DC

## 2016-07-21 MED ORDER — PEN NEEDLES 31G X 5 MM MISC: 1.0000 | Freq: Every day | 5 refills | Status: DC

## 2016-07-21 MED ORDER — INSULIN ASPART 100 UNIT/ML FLEXPEN: 4.0000 [IU] | PEN_INJECTOR | Freq: Every day | SUBCUTANEOUS | 0 refills | Status: DC | PRN

## 2016-07-21 NOTE — Progress Notes (Signed)
Date:  07/21/2016   Name:  Stephen [name].   DOB:  [birth year]   MRN:  [id number]   Chief Complaint: Hospitalization Follow-up [facility] follow up. Doing much better. Would like needles orders for levemir. Would also like test strips for free style lite machine sent in. ) Hospitalized at [facility] from 07/12/16 - 07/19/16 for erythroderma and acute purulent otitis media.  He started on drops and is improving. He is now treating his dermatitis of hands and feet with vinegar soaks and Kenalog cream.  He is also on a prednisone taper. Diabetes  He presents for his follow-up diabetic visit. He has type 2 diabetes mellitus. His disease course has been fluctuating. Pertinent negatives for hypoglycemia include no dizziness or headaches. Pertinent negatives for diabetes include no chest pain and no fatigue. Current diabetic treatment includes oral agent (monotherapy) (and was on insulin in rehab.  Now home and not taking doses regularly.). His home blood glucose trend is fluctuating minimally (sometimes BS up to 180.  ).  He needs test strips and pen needles.    Review of Systems  Constitutional: Negative for chills, fatigue and fever.  HENT: Positive for ear pain. Negative for hearing loss, tinnitus and trouble swallowing.   Eyes: Negative for visual disturbance.  Respiratory: Negative for chest tightness and shortness of breath.   Cardiovascular: Negative for chest pain.  Skin: Positive for rash (improved).  Neurological: Negative for dizziness and headaches.    Patient Active Problem List   Diagnosis Date Noted  . Hypomagnesemia 07/08/2016  . 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 PVD (peripheral vascular disease) ([place]) 11/17/2015  . Hx of falling 11/17/2015  . Protein-calorie malnutrition, severe 11/09/2015  . Moderate alcohol dependence ([place]) 09/12/2015  . Duodenitis 05/06/2015  . Hyponatremia 12/15/2014  . Thrombocytopenia ([place]) 12/15/2014  . Malnutrition ([place]) 12/15/2014  . Tobacco abuse  12/15/2014  . Cholelithiasis 12/15/2014  . Delusional disorder, unspecified type 12/14/2014  . Diabetes mellitus type 2, uncontrolled ([place]) 09/18/2014  . Psoriasis 09/16/2014  . Excessive weight loss 09/16/2014    Prior to Admission medications   Medication Sig Start Date End Date Taking? Authorizing Provider  acetic acid 5 % SOLN Apply 5 mLs topically daily. 07/19/16  Yes Historical Provider, MD  B Complex Vitamins (VITAMIN B COMPLEX PO) Take by mouth. 10/07/15  Yes Historical Provider, MD  ciprofloxacin (CILOXAN) 0.3 % ophthalmic solution Place 5 drops into both ears daily. 07/19/16 07/26/16 Yes Historical Provider, MD  clotrimazole (LOTRIMIN) 1 % cream Apply topically. 10/07/15 10/06/16 Yes Historical Provider, MD  [facility] ALLERGY 25 MG capsule TAKE 1 CAPSULE (25 MG TOTAL) BY MOUTH EVERY 4 (FOUR) HOURS AS NEEDED FOR ITCHING. 06/09/16  Yes Glean Hess, MD  feeding supplement, GLUCERNA SHAKE, (GLUCERNA SHAKE) LIQD Take 237 mLs by mouth 3 (three) times daily between meals. 07/31/15  Yes Glean Hess, MD  folic acid (FOLVITE) 1 MG tablet Take 1 mg by mouth daily.   Yes Historical Provider, MD  gentamicin ointment (GARAMYCIN) 0.1 % Apply 2 application topically daily. 07/19/16 07/19/17 Yes Historical Provider, MD  glipiZIDE (GLUCOTROL) 5 MG tablet Take 5 mg by mouth every morning. 07/20/16 08/19/16 Yes Historical Provider, MD  hydrOXYzine (ATARAX/VISTARIL) 25 MG tablet Take 25 mg by mouth. 10/07/15  Yes Historical Provider, MD  insulin detemir (LEVEMIR) 100 UNIT/ML injection Inject into the skin at bedtime.   Yes Historical Provider, MD  metFORMIN (GLUCOPHAGE) 500 MG tablet TAKE 1 TABLET (500 MG TOTAL) BY MOUTH 3 (THREE) TIMES DAILY  WITH MEALS. 06/16/16  Yes Glean Hess, MD  mometasone (ELOCON) 0.1 % cream Apply topically daily. 01/01/16  Yes Glean Hess, MD  pantoprazole (PROTONIX) 40 MG tablet Take 1 tablet (40 mg total) by mouth daily. 07/31/15  Yes Glean Hess, MD  predniSONE (DELTASONE) 10  MG tablet Take 30 mg by mouth daily. 07/20/16  Yes Historical Provider, MD  triamcinolone ointment (KENALOG) 0.1 % Apply 2 application topically 2 (two) times daily. 07/19/16 07/19/17 Yes Historical Provider, MD    No Known Allergies  Past Surgical History:  Procedure Laterality Date  . COLONOSCOPY  1995   one polyp  . ESOPHAGOGASTRODUODENOSCOPY N/A 05/02/2015   Procedure: ESOPHAGOGASTRODUODENOSCOPY (EGD);  Surgeon: Hulen Luster, MD;  Location: [facility] ENDOSCOPY;  Service: Endoscopy;  Laterality: N/A;    Social History  Substance Use Topics  . Smoking status: Current Every Day Smoker    Packs/day: 1.00    Types: Cigarettes  . Smokeless tobacco: Former Systems developer     Comment: patient not ready to quit  . Alcohol use 0.0 oz/week     Comment: occassional, socially      Medication list has been reviewed and updated.   Physical Exam  Constitutional: He is oriented to person, place, and time. He appears well-developed. No distress.  HENT:  Head: Normocephalic and atraumatic.  Left Ear: Tympanic membrane and ear canal normal.  Mouth/Throat: No posterior oropharyngeal edema or posterior oropharyngeal erythema.  Right canal with white exudate deep to TM, portion of the ear canal visualized is normal.  Unable to visualize the TM  Neck: Normal range of motion. Neck supple. No thyromegaly present.  Cardiovascular: Normal rate, regular rhythm and normal heart sounds.   Pulmonary/Chest: Effort normal and breath sounds normal. No respiratory distress. He has no wheezes.  Musculoskeletal: He exhibits no edema or tenderness.  Lymphadenopathy:    He has no cervical adenopathy.  Neurological: He is alert and oriented to person, place, and time.  Skin: Skin is warm and dry. No rash noted.  Peeling skin on palms and soles - healthy pink skin underneath.  No weeping, odor, pustules noted  Psychiatric: He has a normal mood and affect. His behavior is normal. Thought content normal.  Nursing note and vitals  reviewed.   BP (!) 158/90 (BP Location: Right Arm, Patient Position: Sitting, Cuff Size: Normal)   Pulse 86   Resp 14   Ht 5\' 7"  (1.702 m)   Wt 117 lb (53.1 kg)   SpO2 98%   BMI 18.32 kg/m   Assessment and Plan: 1. Erythroderma Begin vinegar/water soaks to hands and feet Apply Kenalog twice a day  2. Uncontrolled type 2 diabetes mellitus with hyperosmolarity without coma, unspecified long term insulin use status (HCC) Continue metformin and glipizide Use Novolog flexpen 4 units once a day for BS > 200 - glucose blood (FREESTYLE LITE) test strip; Use as instructed  Dispense: 50 each; Refill: 12 - Insulin Pen Needle (PEN NEEDLES) 31G X 5 MM MISC; 1 each by Does not apply route daily.  Dispense: 50 each; Refill: 5  3. PVD (peripheral vascular disease) ([place]) stable  4. Right acute serous otitis media, recurrence not specified Improving - continue full course of drops   Meds ordered this encounter  Medications  . glucose blood (FREESTYLE LITE) test strip    Sig: Use as instructed    Dispense:  50 each    Refill:  12  . Insulin Pen Needle (PEN NEEDLES) 31G X 5  MM MISC    Sig: 1 each by Does not apply route daily.    Dispense:  50 each    Refill:  [place], MD [facility]  07/21/2016

## 2016-07-21 NOTE — Patient Instructions (Signed)
For blood sugar >200 take 4 units of Novolog (only do this once a day)

## 2016-07-26 DIAGNOSIS — L2084 Intrinsic (allergic) eczema: Secondary | ICD-10-CM | POA: Diagnosis not present

## 2016-07-26 DIAGNOSIS — B356 Tinea cruris: Secondary | ICD-10-CM | POA: Diagnosis not present

## 2016-08-04 ENCOUNTER — Telehealth: Payer: Self-pay

## 2016-08-04 NOTE — Telephone Encounter (Signed)
Pt called stating having black stools for 4 days now. Pt did start taking pepto bismol for constant diarrhea. Told pt to stop taking pepto and if continues within next week he needs to be seen at ER to rule out bleeding.

## 2017-02-04 DIAGNOSIS — H25813 Combined forms of age-related cataract, bilateral: Secondary | ICD-10-CM | POA: Diagnosis not present

## 2017-02-06 LAB — HM DIABETES EYE EXAM

## 2017-02-16 ENCOUNTER — Other Ambulatory Visit: Payer: Self-pay

## 2017-02-16 ENCOUNTER — Other Ambulatory Visit: Payer: Self-pay | Admitting: Internal Medicine

## 2017-02-16 MED ORDER — METFORMIN HCL 500 MG PO TABS: 500.0000 mg | ORAL_TABLET | Freq: Three times a day (TID) | ORAL | 0 refills | Status: DC

## 2017-02-16 MED ORDER — TRIAMCINOLONE ACETONIDE 0.1 % EX OINT: 2.0000 "application " | TOPICAL_OINTMENT | Freq: Two times a day (BID) | CUTANEOUS | 0 refills | Status: DC

## 2017-02-18 ENCOUNTER — Ambulatory Visit (INDEPENDENT_AMBULATORY_CARE_PROVIDER_SITE_OTHER): Payer: PPO | Admitting: Internal Medicine

## 2017-02-18 ENCOUNTER — Encounter: Payer: Self-pay | Admitting: Internal Medicine

## 2017-02-18 VITALS — BP 128/60 | HR 70 | Ht 67.0 in | Wt 103.0 lb

## 2017-02-18 DIAGNOSIS — D696 Thrombocytopenia, unspecified: Secondary | ICD-10-CM

## 2017-02-18 DIAGNOSIS — E440 Moderate protein-calorie malnutrition: Secondary | ICD-10-CM

## 2017-02-18 DIAGNOSIS — L539 Erythematous condition, unspecified: Secondary | ICD-10-CM

## 2017-02-18 DIAGNOSIS — F1021 Alcohol dependence, in remission: Secondary | ICD-10-CM

## 2017-02-18 DIAGNOSIS — E1165 Type 2 diabetes mellitus with hyperglycemia: Secondary | ICD-10-CM

## 2017-02-18 MED ORDER — METFORMIN HCL 500 MG PO TABS: 500.0000 mg | ORAL_TABLET | Freq: Three times a day (TID) | ORAL | 1 refills | Status: DC

## 2017-02-18 MED ORDER — HYDROXYZINE HCL 25 MG PO TABS: 25.0000 mg | ORAL_TABLET | Freq: Four times a day (QID) | ORAL | 5 refills | Status: DC | PRN

## 2017-02-18 NOTE — Progress Notes (Signed)
Date:  02/18/2017   Name:  Stephen [name].   DOB:  Mar 24, 1946   MRN:  [id number]   Chief Complaint: Diabetes (Need refill metformin and itching pill. ) Diabetes  He presents for his follow-up diabetic visit. He has type 2 diabetes mellitus. Pertinent negatives for hypoglycemia include no dizziness or headaches. Pertinent negatives for diabetes include no chest pain, no fatigue, no polydipsia and no polyuria.  Rash  This is a chronic problem. The problem has been waxing and waning since onset. The affected locations include the left hand, right hand, left foot and right foot. The rash is characterized by scaling and peeling. Pertinent negatives include no cough, diarrhea, fatigue, fever or shortness of breath.   He says he is eating well.  He takes an ensure shake occasionally. He denies weight loss but his last weight was 117 and today 103.  He states that he is living at home now.  No longer drinking any alcohol but smoking about 1 ppd cigarettes.  He states that his appetite is good.  He recently had an eye exam because he failed his driving test.  He was told that there was no diabetes in his eyes. He declines all labs, vaccinations and health maintenance recommendations.  Lab Results  Component Value Date   HGBA1C 7.1 (H) 01/01/2016      Review of Systems  Constitutional: Negative for chills, fatigue and fever.  HENT: Negative for trouble swallowing.   Eyes: Positive for visual disturbance.  Respiratory: Negative for cough, chest tightness and shortness of breath.   Cardiovascular: Negative for chest pain, palpitations and leg swelling.  Gastrointestinal: Negative for abdominal pain and diarrhea.  Endocrine: Negative for polydipsia and polyuria.  Skin: Positive for color change and rash.  Neurological: Negative for dizziness and headaches.  Psychiatric/Behavioral: Negative for sleep disturbance.    Patient Active Problem List   Diagnosis Date Noted  . Hypomagnesemia  07/08/2016  . 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 PVD (peripheral vascular disease) ([place]) 11/17/2015  . Hx of falling 11/17/2015  . Protein-calorie malnutrition, severe 11/09/2015  . Alcoholism in remission ([place]) 09/12/2015  . Duodenitis 05/06/2015  . Hyponatremia 12/15/2014  . Thrombocytopenia ([place]) 12/15/2014  . Malnutrition ([place]) 12/15/2014  . Tobacco abuse 12/15/2014  . Cholelithiasis 12/15/2014  . Diabetes mellitus type 2, uncontrolled ([place]) 09/18/2014  . Psoriasis 09/16/2014  . Excessive weight loss 09/16/2014    Prior to Admission medications   Medication Sig Start Date End Date Taking? Authorizing Provider  acetic acid 5 % SOLN Apply 5 mLs topically daily. 07/19/16   [provider]  B Complex Vitamins (VITAMIN B COMPLEX PO) Take by mouth. 10/07/15   [provider]  [facility]S ALLERGY 25 MG capsule TAKE 1 CAPSULE (25 MG TOTAL) BY MOUTH EVERY 4 (FOUR) HOURS AS NEEDED FOR ITCHING. 06/09/16   Glean Hess, MD  feeding supplement, GLUCERNA SHAKE, (GLUCERNA SHAKE) LIQD Take 237 mLs by mouth 3 (three) times daily between meals. 07/31/15   Glean Hess, MD  folic acid (FOLVITE) 1 MG tablet Take 1 mg by mouth daily.    [provider]  gentamicin ointment (GARAMYCIN) 0.1 % Apply 2 application topically daily. 07/19/16 07/19/17  [provider]  glipiZIDE (GLUCOTROL) 5 MG tablet Take 5 mg by mouth every morning. 07/20/16 08/19/16  [provider]  glucose blood (FREESTYLE LITE) test strip Use as instructed 07/21/16   Glean Hess, MD  hydrOXYzine (ATARAX/VISTARIL) 25 MG tablet Take 25 mg by mouth.  10/07/15   [provider]  insulin aspart (NOVOLOG FLEXPEN) 100 UNIT/ML FlexPen Inject 4 Units into the skin daily as needed for high blood sugar (for BS > 200). 07/21/16   Glean Hess, MD  Insulin Pen Needle (PEN NEEDLES) 31G X 5 MM MISC 1 each by Does not apply route daily. 07/21/16   Glean Hess, MD  metFORMIN (GLUCOPHAGE) 500 MG tablet  Take 1 tablet (500 mg total) 3 (three) times daily with meals by mouth. 02/16/17   Glean Hess, MD  pantoprazole (PROTONIX) 40 MG tablet Take 1 tablet (40 mg total) by mouth daily. 07/31/15   Glean Hess, MD  predniSONE (DELTASONE) 10 MG tablet Take 30 mg by mouth daily. 07/20/16   [provider]  triamcinolone ointment (KENALOG) 0.1 % Apply 2 application 2 (two) times daily topically. 02/16/17 02/16/18  Glean Hess, MD    No Known Allergies  Past Surgical History:  Procedure Laterality Date  . COLONOSCOPY  1995   one polyp    Social History   Tobacco Use  . Smoking status: Current Every Day Smoker    Packs/day: 1.00    Types: Cigarettes  . Smokeless tobacco: Former Systems developer  . Tobacco comment: patient not ready to quit  Substance Use Topics  . Alcohol use: Yes    Alcohol/week: 0.0 oz    Comment: occassional, socially   . Drug use: Yes    Frequency: 2.0 times per week    Types: Marijuana    Comment: marijuana     Medication list has been reviewed and updated.  PHQ 2/9 Scores 02/18/2017 02/18/2017 01/16/2016 12/18/2015  PHQ - 2 Score 0 0 0 0    Physical Exam  Constitutional: He is oriented to person, place, and time. Vital signs are normal. He appears cachectic. No distress.  HENT:  Head: Normocephalic and atraumatic.  Neck: Normal range of motion. Neck supple. Carotid bruit is not present.  Cardiovascular: Normal rate, regular rhythm and normal heart sounds.  Pulmonary/Chest: Effort normal. No respiratory distress. He has no decreased breath sounds. He has no wheezes.  Musculoskeletal: Normal range of motion.       Right knee: Normal.       Left knee: Normal.  Neurological: He is alert and oriented to person, place, and time.  Skin: Skin is warm and dry. No rash noted.  Extensive skin thickening over arms, legs, back, hand and feet. No drainage or odor.   Psychiatric: He has a normal mood and affect. His speech is normal and behavior is normal.  Thought content normal.  Nursing note and vitals reviewed.   BP 128/60   Pulse 70   Ht 5\' 7"  (1.702 m)   Wt 103 lb (46.7 kg)   SpO2 97%   BMI 16.13 kg/m   Assessment and Plan: 1. Uncontrolled type 2 diabetes mellitus with hyperglycemia (HCC) Continue metformin Continue Lantus PRN his judgement/BS elevated Pt declines labs - agreed to do next visit - metFORMIN (GLUCOPHAGE) 500 MG tablet; Take 1 tablet (500 mg total) 3 (three) times daily with meals by mouth.  Dispense: 270 tablet; Refill: 1  2. Erythroderma Stable, chronic Declines referral back to [facility] - hydrOXYzine (ATARAX/VISTARIL) 25 MG tablet; Take 1 tablet (25 mg total) every 6 (six) hours as needed by mouth.  Dispense: 120 tablet; Refill: 5  3. Moderate protein-calorie malnutrition ([place]) Does not appear much improved  4. Thrombocytopenia ([place]) Pt refuses labs  5. Moderate alcohol dependence ([place])  Now abstinent per patient - he is congratulated on quitting   Meds ordered this encounter  Medications  . metFORMIN (GLUCOPHAGE) 500 MG tablet    Sig: Take 1 tablet (500 mg total) 3 (three) times daily with meals by mouth.    Dispense:  270 tablet    Refill:  1  . hydrOXYzine (ATARAX/VISTARIL) 25 MG tablet    Sig: Take 1 tablet (25 mg total) every 6 (six) hours as needed by mouth.    Dispense:  120 tablet    Refill:  5    Partially dictated using Editor, commissioning. Any errors are unintentional.  [name], MD [facility]  02/18/2017

## 2017-02-22 ENCOUNTER — Encounter: Payer: Self-pay | Admitting: Internal Medicine

## 2017-04-13 ENCOUNTER — Other Ambulatory Visit: Payer: Self-pay | Admitting: Internal Medicine

## 2017-06-13 ENCOUNTER — Telehealth: Payer: Self-pay

## 2017-06-13 NOTE — Patient Outreach (Signed)
Franki Monte friend of patient stated she was calling back due to hoping patient could get back active with [facility] services. She added that patient recently fell and wanted to speak with prior LCSW on behalf of the patient so he could go into [facility] facilty.  CMA will send message to LCSW in hopes to reach out and determine [facility] services.

## 2017-06-14 DIAGNOSIS — R627 Adult failure to thrive: Secondary | ICD-10-CM | POA: Diagnosis not present

## 2017-06-14 DIAGNOSIS — D72829 Elevated white blood cell count, unspecified: Secondary | ICD-10-CM | POA: Diagnosis not present

## 2017-06-14 DIAGNOSIS — E860 Dehydration: Secondary | ICD-10-CM | POA: Diagnosis not present

## 2017-06-14 DIAGNOSIS — E119 Type 2 diabetes mellitus without complications: Secondary | ICD-10-CM | POA: Diagnosis not present

## 2017-06-14 DIAGNOSIS — M791 Myalgia, unspecified site: Secondary | ICD-10-CM | POA: Diagnosis not present

## 2017-06-14 DIAGNOSIS — M898X9 Other specified disorders of bone, unspecified site: Secondary | ICD-10-CM | POA: Diagnosis not present

## 2017-06-14 DIAGNOSIS — R918 Other nonspecific abnormal finding of lung field: Secondary | ICD-10-CM | POA: Diagnosis not present

## 2017-06-14 DIAGNOSIS — R000 Tachycardia, unspecified: Secondary | ICD-10-CM | POA: Diagnosis not present

## 2017-06-14 DIAGNOSIS — Z79899 Other long term (current) drug therapy: Secondary | ICD-10-CM | POA: Diagnosis not present

## 2017-06-14 DIAGNOSIS — Z7984 Long term (current) use of oral hypoglycemic drugs: Secondary | ICD-10-CM | POA: Diagnosis not present

## 2017-06-15 DIAGNOSIS — A4901 Methicillin susceptible Staphylococcus aureus infection, unspecified site: Secondary | ICD-10-CM | POA: Diagnosis not present

## 2017-06-15 DIAGNOSIS — R2991 Unspecified symptoms and signs involving the musculoskeletal system: Secondary | ICD-10-CM | POA: Diagnosis not present

## 2017-06-15 DIAGNOSIS — L539 Erythematous condition, unspecified: Secondary | ICD-10-CM | POA: Diagnosis not present

## 2017-06-15 DIAGNOSIS — R627 Adult failure to thrive: Secondary | ICD-10-CM | POA: Diagnosis not present

## 2017-06-15 DIAGNOSIS — N390 Urinary tract infection, site not specified: Secondary | ICD-10-CM | POA: Diagnosis not present

## 2017-06-15 DIAGNOSIS — R000 Tachycardia, unspecified: Secondary | ICD-10-CM | POA: Diagnosis not present

## 2017-06-15 DIAGNOSIS — Z7984 Long term (current) use of oral hypoglycemic drugs: Secondary | ICD-10-CM | POA: Diagnosis not present

## 2017-06-15 DIAGNOSIS — Z79899 Other long term (current) drug therapy: Secondary | ICD-10-CM | POA: Diagnosis not present

## 2017-06-15 DIAGNOSIS — M898X9 Other specified disorders of bone, unspecified site: Secondary | ICD-10-CM | POA: Diagnosis not present

## 2017-06-15 DIAGNOSIS — K219 Gastro-esophageal reflux disease without esophagitis: Secondary | ICD-10-CM | POA: Diagnosis not present

## 2017-06-15 DIAGNOSIS — M6281 Muscle weakness (generalized): Secondary | ICD-10-CM | POA: Diagnosis not present

## 2017-06-15 DIAGNOSIS — D72829 Elevated white blood cell count, unspecified: Secondary | ICD-10-CM | POA: Diagnosis not present

## 2017-06-15 DIAGNOSIS — E119 Type 2 diabetes mellitus without complications: Secondary | ICD-10-CM | POA: Diagnosis not present

## 2017-06-15 DIAGNOSIS — L408 Other psoriasis: Secondary | ICD-10-CM | POA: Diagnosis not present

## 2017-06-15 DIAGNOSIS — D649 Anemia, unspecified: Secondary | ICD-10-CM | POA: Diagnosis not present

## 2017-06-15 DIAGNOSIS — M6250 Muscle wasting and atrophy, not elsewhere classified, unspecified site: Secondary | ICD-10-CM | POA: Diagnosis not present

## 2017-06-15 DIAGNOSIS — R531 Weakness: Secondary | ICD-10-CM | POA: Diagnosis not present

## 2017-06-15 DIAGNOSIS — L299 Pruritus, unspecified: Secondary | ICD-10-CM | POA: Diagnosis not present

## 2017-06-15 MED ORDER — DOXYCYCLINE HYCLATE 100 MG PO TABS
100.00 mg | ORAL_TABLET | ORAL | Status: DC
Start: 2017-06-15 — End: 2017-06-15

## 2017-06-15 MED ORDER — METFORMIN HCL 500 MG PO TABS
500.00 mg | ORAL_TABLET | ORAL | Status: DC
Start: 2017-06-15 — End: 2017-06-15

## 2017-06-16 ENCOUNTER — Other Ambulatory Visit: Payer: Self-pay | Admitting: *Deleted

## 2017-06-16 NOTE — Patient Outreach (Signed)
[facility]) Care Management  06/16/2017  Jp Eastham. [birth year] [id number]   CSW had received referral on 3/4 from patient's friend, Franki Monte that patient was interested in going to [facility]. CSW called & spoke with patient who informed CSW that he was admitted to SNF this morning and plans to stay for short term rehab and will return home afterwards. CSW will inform [name], [facility] LCSW whom this CSW is covering for to follow while in SNF and for referral to [facility] transition of care once patient returns home if necessary.    Raynaldo Opitz, LCSW [facility]  Clinical Social Worker cell #: [phone]

## 2017-06-22 LAB — HEMOGLOBIN A1C: HEMOGLOBIN A1C: 7

## 2017-06-27 ENCOUNTER — Other Ambulatory Visit: Payer: Self-pay | Admitting: *Deleted

## 2017-06-27 NOTE — Patient Outreach (Signed)
[facility]) Care Management  06/27/2017  Juanantonio Stolar. [birth year] [id number]   Phone call to patient to discuss his progress at [facility]. Voicemail message left for a return call.. Visit scheduled for 06/1917 at [facility].   [name] [facility] Care Management [phone]

## 2017-06-27 NOTE — Patient Outreach (Signed)
[facility]) Care Management  06/27/2017  Stephen [name]. [birth year] [id number]   Phone call to discharge planner at [facility], [name] to follow up on patient's progress in rehab. Voicemail message left for a return call.    [name] [facility] Care Management [phone]

## 2017-06-28 ENCOUNTER — Encounter: Payer: Self-pay | Admitting: *Deleted

## 2017-06-28 ENCOUNTER — Other Ambulatory Visit: Payer: Self-pay | Admitting: *Deleted

## 2017-06-28 NOTE — Patient Outreach (Signed)
[facility]) Care Management  [facility] Social Work  06/28/2017  Stephen [name]. Aug 28, 1945 [id number]  Subjective:  Patient is  72 year old male, currently in [facility] after being hospitalized at [facility] for weakness. Patient states that he needs more help around the house with bathing , dressing, housekeeping, and meal preparation. Per patient, he has aid through  [facility] that comes 12 hours per week but would like to see if those hours could be increased. Per patient, he has discussed this with [facility] care but has not heard anything back yet. Patient is adamant that he would like to return home, he does not want to stay in a facility long term due to cost and feeling that he can make it on his own with help.  Patient states that he is willing to pay out of pocket for additional  In home help as the last resort.  Objective:   Encounter Medications:  Outpatient Encounter Medications as of 06/28/2017  Medication Sig Note  . acetic acid 5 % SOLN Apply 5 mLs topically daily.   . B Complex Vitamins (VITAMIN B COMPLEX PO) Take by mouth. 11/17/2015: Received from: [facility]: Take 1 capsule by mouth daily.  . diphenhydrAMINE (BENADRYL) 25 mg capsule TAKE 1 CAPSULE (25 MG TOTAL) BY MOUTH EVERY 4 (FOUR) HOURS AS NEEDED FOR ITCHING.   . feeding supplement, GLUCERNA SHAKE, (GLUCERNA SHAKE) LIQD Take 237 mLs by mouth 3 (three) times daily between meals. 11/25/2015: Taking once a day   . folic acid (FOLVITE) 1 MG tablet Take 1 mg by mouth daily.   Marland Kitchen glucose blood (FREESTYLE LITE) test strip Use as instructed   . hydrOXYzine (ATARAX/VISTARIL) 25 MG tablet Take 1 tablet (25 mg total) every 6 (six) hours as needed by mouth.   . insulin aspart (NOVOLOG FLEXPEN) 100 UNIT/ML FlexPen Inject 4 Units into the skin daily as needed for high blood sugar (for BS > 200).   . Insulin Pen Needle (PEN NEEDLES) 31G X 5 MM MISC 1 each by Does not apply  route daily.   . metFORMIN (GLUCOPHAGE) 500 MG tablet Take 1 tablet (500 mg total) 3 (three) times daily with meals by mouth.   . pantoprazole (PROTONIX) 40 MG tablet Take 1 tablet (40 mg total) by mouth daily. (Patient not taking: Reported on 02/18/2017)   . triamcinolone ointment (KENALOG) 0.1 % Apply 2 application 2 (two) times daily topically.    No facility-administered encounter medications on file as of 06/28/2017.     Functional Status:  In your present state of health, do you have any difficulty performing the following activities: 06/28/2017 02/18/2017  Hearing? N N  Vision? N N  Difficulty concentrating or making decisions? N N  Walking or climbing stairs? Y N  Dressing or bathing? Y N  Doing errands, shopping? Y N  Preparing Food and eating ? Y -  Using the Toilet? Y -  In the past six months, have you accidently leaked urine? Y -  Do you have problems with loss of bowel control? Y -  Managing your Medications? N -  Managing your Finances? N -  Housekeeping or managing your Housekeeping? Y -  Some recent data might be hidden    Fall/Depression Screening:  PHQ 2/9 Scores 06/28/2017 02/18/2017 02/18/2017 01/16/2016 12/18/2015 11/17/2015 11/12/2015  PHQ - 2 Score 0 0 0 0 0 0 0    Assessment: Patient very pleasant, however very  frail. Patient laying in bed during visit, with complaints of constant leg pain and being cold. Patient agrees that he  Denied OT during visit, who came in to work on arm strength. Patient reminded of goals to get stronger to improve mobility and independence.  Patient reluctantly agreed to participate.  This Education officer, museum spoke with the [name], discharge planner at [employer] who stated that there is no discharge date set yet, however patient will be discharging home with [facility].  This social worker contacted [facility], left a message requesting a return call regarding the possibility of increasing patient's hours for in home care.  Plan: This  Education officer, museum will continue to follow patient while in the SNF to assist with discharge planning.   [name] [facility] Care Management [phone]

## 2017-06-29 ENCOUNTER — Other Ambulatory Visit: Payer: Self-pay | Admitting: *Deleted

## 2017-06-29 ENCOUNTER — Encounter: Payer: Self-pay | Admitting: *Deleted

## 2017-06-29 NOTE — Patient Outreach (Signed)
[facility] Care Management  06/29/2017  Stephen [name]. 04-24-1945 [id number]   Return call from [facility], case manager from [facility] who confirms that she will be able to increase patient's in home aid hours to 15 hours per week.  Per Tammy, patient had terminated services at the end of November and services were recently re-started at the end of February. She visited patient at [facility] and agrees that patients health has declined since she last worked with him.    Plan: [facility] care will increase patient's hours to 15 hours per week,          This Education officer, museum will explore additional supports with patient.    [name] [facility] Care Management [phone]

## 2017-07-01 ENCOUNTER — Telehealth: Payer: Self-pay

## 2017-07-01 ENCOUNTER — Other Ambulatory Visit: Payer: Self-pay | Admitting: *Deleted

## 2017-07-01 NOTE — Patient Outreach (Signed)
[place] [facility]) Care Management  07/01/2017  Stephen [name]. Aug 15, 1945 [id number]   Phone call from discharge planner at [facility]-Shawn Cecille Rubin who confirmed that patient will be discharging home on Sunday with [place] through [place]. Per Raquel Sarna, follow up appointment with patient's primary care doctor has not been made due to patient's non-adherence to hospital discharge appointments,  Meals on Wheels, informed and will resume on Tuesday 07/05/17,  as well as his private duty aid hours which has been increased to 15 hours per week through [employer] program.     Plan: This social worker will follow up with patient post discharge from [facility].    [name] [facility] Care Management [phone]  \

## 2017-07-01 NOTE — Telephone Encounter (Signed)
[facility] called to schedule F/U Post Rehab but Dr.B would like him to continue [facility] and await their recommendations on when to F/U as he has habit of no show when hospital schedules these. Called [facility] and advised and Alvis Lemmings will be in contact for orders.

## 2017-07-04 DIAGNOSIS — G894 Chronic pain syndrome: Secondary | ICD-10-CM | POA: Diagnosis not present

## 2017-07-04 DIAGNOSIS — E119 Type 2 diabetes mellitus without complications: Secondary | ICD-10-CM | POA: Diagnosis not present

## 2017-07-04 DIAGNOSIS — L309 Dermatitis, unspecified: Secondary | ICD-10-CM | POA: Diagnosis not present

## 2017-07-04 DIAGNOSIS — L408 Other psoriasis: Secondary | ICD-10-CM | POA: Diagnosis not present

## 2017-07-05 ENCOUNTER — Ambulatory Visit (INDEPENDENT_AMBULATORY_CARE_PROVIDER_SITE_OTHER): Payer: Medicare Other | Admitting: Internal Medicine

## 2017-07-05 ENCOUNTER — Encounter: Payer: Self-pay | Admitting: Internal Medicine

## 2017-07-05 VITALS — BP 112/60 | HR 101 | Ht 67.0 in | Wt 95.0 lb

## 2017-07-05 DIAGNOSIS — G894 Chronic pain syndrome: Secondary | ICD-10-CM | POA: Diagnosis not present

## 2017-07-05 DIAGNOSIS — L408 Other psoriasis: Secondary | ICD-10-CM | POA: Diagnosis not present

## 2017-07-05 DIAGNOSIS — L409 Psoriasis, unspecified: Secondary | ICD-10-CM | POA: Diagnosis not present

## 2017-07-05 DIAGNOSIS — I739 Peripheral vascular disease, unspecified: Secondary | ICD-10-CM | POA: Diagnosis not present

## 2017-07-05 DIAGNOSIS — R634 Abnormal weight loss: Secondary | ICD-10-CM

## 2017-07-05 DIAGNOSIS — E1165 Type 2 diabetes mellitus with hyperglycemia: Secondary | ICD-10-CM | POA: Diagnosis not present

## 2017-07-05 DIAGNOSIS — E119 Type 2 diabetes mellitus without complications: Secondary | ICD-10-CM | POA: Diagnosis not present

## 2017-07-05 DIAGNOSIS — L309 Dermatitis, unspecified: Secondary | ICD-10-CM | POA: Diagnosis not present

## 2017-07-05 NOTE — Progress Notes (Signed)
Date:  07/05/2017   Name:  Stephen [name].   DOB:  October 07, 1945   MRN:  [id number]   Chief Complaint: Hospitalization Follow-up (Discharged from [facility] 3 days ago. )  Diabetes  He presents for his follow-up diabetic visit. He has type 2 diabetes mellitus. His disease course has been stable. Pertinent negatives for hypoglycemia include no dizziness, headaches or tremors. Pertinent negatives for diabetes include no chest pain, no fatigue, no polydipsia and no polyuria. Current diabetic treatment includes oral agent (monotherapy). He is compliant with treatment most of the time. There is no change in his home blood glucose trend. His breakfast blood glucose is taken between 7-8 am. His breakfast blood glucose range is generally 110-130 mg/dl.   Admitted to [facility] with FTT and weight loss.  Abnormal CXR noted - treated for pneumonia.  He does not think that he had pneumonia.  He does smoke and we discussed the possibility of neoplasm.  He does not want to have any further evaluation.  We also discussed the possibility of malignancy associated weight loss.  Psoriasis - stable, using TAC cream; still applies saran wrap at times.   Review of Systems  Constitutional: Positive for unexpected weight change. Negative for appetite change, diaphoresis and fatigue.  Eyes: Negative for visual disturbance.  Respiratory: Negative for cough, shortness of breath and wheezing.   Cardiovascular: Negative for chest pain, palpitations and leg swelling.  Gastrointestinal: Negative for abdominal pain and blood in stool.  Endocrine: Negative for polydipsia and polyuria.  Genitourinary: Negative for dysuria and hematuria.  Skin: Positive for color change and rash.  Neurological: Negative for dizziness, tremors, numbness and headaches.  Psychiatric/Behavioral: Negative for dysphoric mood.    Patient Active Problem List   Diagnosis Date Noted  . Hypomagnesemia 07/08/2016  . 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  PVD (peripheral vascular disease) ([place]) 11/17/2015  . Hx of falling 11/17/2015  . Protein-calorie malnutrition, severe 11/09/2015  . 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 Duodenitis 05/06/2015  . Hyponatremia 12/15/2014  . Thrombocytopenia ([place]) 12/15/2014  . Malnutrition ([place]) 12/15/2014  . Tobacco abuse 12/15/2014  . Cholelithiasis 12/15/2014  . Diabetes mellitus type 2, uncontrolled ([place]) 09/18/2014  . Psoriasis 09/16/2014  . Excessive weight loss 09/16/2014    Prior to Admission medications   Medication Sig Start Date End Date Taking? Authorizing Provider  gabapentin (NEURONTIN) 100 MG capsule Take 100 mg by mouth 2 (two) times daily.   Yes [provider]  glucose blood (FREESTYLE LITE) test strip Use as instructed 07/21/16  Yes Glean Hess, MD  hydrOXYzine (ATARAX/VISTARIL) 25 MG tablet Take 1 tablet (25 mg total) every 6 (six) hours as needed by mouth. 02/18/17  Yes Glean Hess, MD  insulin aspart (NOVOLOG FLEXPEN) 100 UNIT/ML FlexPen Inject 4 Units into the skin daily as needed for high blood sugar (for BS > 200). 07/21/16  Yes Glean Hess, MD  Insulin Pen Needle (PEN NEEDLES) 31G X 5 MM MISC 1 each by Does not apply route daily. 07/21/16  Yes Glean Hess, MD  metFORMIN (GLUCOPHAGE) 500 MG tablet Take 1 tablet (500 mg total) 3 (three) times daily with meals by mouth. Patient taking differently: Take 500 mg by mouth daily.  02/18/17  Yes Glean Hess, MD  oxyCODONE-acetaminophen (PERCOCET/ROXICET) 5-325 MG tablet Take 1 tablet by mouth every 4 (four) hours as needed for severe pain.   Yes [provider]  Skin Protectants, Misc. (MINERIN) CREA Apply topically.  Yes [provider]  triamcinolone ointment (KENALOG) 0.1 % Apply 2 application 2 (two) times daily topically. 02/16/17 02/16/18 Yes Glean Hess, MD  diphenhydrAMINE (BENADRYL) 25 mg capsule TAKE 1 CAPSULE (25 MG TOTAL) BY MOUTH EVERY 4 (FOUR) HOURS AS NEEDED FOR  ITCHING. 04/13/17   Glean Hess, MD    No Known Allergies  Past Surgical History:  Procedure Laterality Date  . COLONOSCOPY  1995   one polyp  . ESOPHAGOGASTRODUODENOSCOPY N/A 05/02/2015   Procedure: ESOPHAGOGASTRODUODENOSCOPY (EGD);  Surgeon: Hulen Luster, MD;  Location: [facility] ENDOSCOPY;  Service: Endoscopy;  Laterality: N/A;    Social History   Tobacco Use  . Smoking status: Current Every Day Smoker    Packs/day: 1.00    Types: Cigarettes  . Smokeless tobacco: Former Systems developer  . Tobacco comment: patient not ready to quit  Substance Use Topics  . Alcohol use: Yes    Alcohol/week: 0.0 oz    Comment: occassional, socially   . Drug use: Yes    Frequency: 2.0 times per week    Types: Marijuana    Comment: marijuana     Medication list has been reviewed and updated.  PHQ 2/9 Scores 06/28/2017 02/18/2017 02/18/2017 01/16/2016  PHQ - 2 Score 0 0 0 0    Physical Exam  Constitutional: He is oriented to person, place, and time. He appears cachectic. No distress.  HENT:  Head: Normocephalic and atraumatic.  Cardiovascular: Normal rate, regular rhythm and normal heart sounds.  Pulmonary/Chest: Effort normal. No respiratory distress. He has no decreased breath sounds. He has no wheezes. He has no rhonchi.  Abdominal: Soft. Normal appearance and bowel sounds are normal.  Musculoskeletal: Normal range of motion.  Neurological: He is alert and oriented to person, place, and time.  Skin: Skin is warm and dry. No rash noted.  Dry flaking skin both hands - no pustules or weeping  Psychiatric: He has a normal mood and affect. His speech is normal and behavior is normal. Thought content normal.  Nursing note and vitals reviewed.   BP 112/60   Pulse (!) 101   Ht 5\' 7"  (1.702 m)   Wt 95 lb (43.1 kg)   SpO2 98%   BMI 14.88 kg/m   Assessment and Plan: 1. Excessive weight loss Use Glucerna twice a day between meals Follow up in 2 mo to recheck weight  2. Uncontrolled type 2 diabetes  mellitus with hyperglycemia (HCC) Last A1C was good Continue oral agents  3. PVD (peripheral vascular disease) ([place]) stable  4. Psoriasis Continue topical treatment   No orders of the defined types were placed in this encounter.   Partially dictated using Editor, commissioning. Any errors are unintentional.  [name], MD [facility]  07/05/2017

## 2017-07-07 ENCOUNTER — Other Ambulatory Visit: Payer: Self-pay | Admitting: *Deleted

## 2017-07-07 NOTE — Patient Outreach (Signed)
[facility]) Care Management  07/07/2017  Stephen [name]. [birth year] [id number]   Phone call to patient to assess for community resource needs following his SNF stay.  Per patient, he is doing well. He continues to receive case management services through [facility] care and his in home aid hours have been increased to 15 hours per week. Patient also receives Meals on Wheels. Patient states that he did follow up with his primary care doctor on 07/05/17. Patient reports having supportive neighbors that assist with his care when available. Patient discussed being appreciative that his in home aid hours have been increased and feels that the increase in hours will be beneficial. Patient reported having no additional community resource needs at this time.,   Plan: Patient to be closed at this time as he is participating in another care management through [facility].   [name] [facility] Care Management [phone]

## 2017-07-09 DIAGNOSIS — L408 Other psoriasis: Secondary | ICD-10-CM | POA: Diagnosis not present

## 2017-07-09 DIAGNOSIS — L309 Dermatitis, unspecified: Secondary | ICD-10-CM | POA: Diagnosis not present

## 2017-07-09 DIAGNOSIS — G894 Chronic pain syndrome: Secondary | ICD-10-CM | POA: Diagnosis not present

## 2017-07-09 DIAGNOSIS — E119 Type 2 diabetes mellitus without complications: Secondary | ICD-10-CM | POA: Diagnosis not present

## 2017-07-13 DIAGNOSIS — E119 Type 2 diabetes mellitus without complications: Secondary | ICD-10-CM | POA: Diagnosis not present

## 2017-07-13 DIAGNOSIS — L408 Other psoriasis: Secondary | ICD-10-CM | POA: Diagnosis not present

## 2017-07-13 DIAGNOSIS — G894 Chronic pain syndrome: Secondary | ICD-10-CM | POA: Diagnosis not present

## 2017-07-13 DIAGNOSIS — L309 Dermatitis, unspecified: Secondary | ICD-10-CM | POA: Diagnosis not present

## 2017-07-15 DIAGNOSIS — L408 Other psoriasis: Secondary | ICD-10-CM | POA: Diagnosis not present

## 2017-07-15 DIAGNOSIS — E119 Type 2 diabetes mellitus without complications: Secondary | ICD-10-CM | POA: Diagnosis not present

## 2017-07-15 DIAGNOSIS — L309 Dermatitis, unspecified: Secondary | ICD-10-CM | POA: Diagnosis not present

## 2017-07-15 DIAGNOSIS — G894 Chronic pain syndrome: Secondary | ICD-10-CM | POA: Diagnosis not present

## 2017-07-18 ENCOUNTER — Other Ambulatory Visit (INDEPENDENT_AMBULATORY_CARE_PROVIDER_SITE_OTHER): Payer: Medicare Other | Admitting: Internal Medicine

## 2017-07-18 DIAGNOSIS — F329 Major depressive disorder, single episode, unspecified: Secondary | ICD-10-CM

## 2017-07-18 DIAGNOSIS — E440 Moderate protein-calorie malnutrition: Secondary | ICD-10-CM

## 2017-07-18 DIAGNOSIS — E1165 Type 2 diabetes mellitus with hyperglycemia: Secondary | ICD-10-CM

## 2017-07-18 DIAGNOSIS — L539 Erythematous condition, unspecified: Secondary | ICD-10-CM

## 2017-07-18 DIAGNOSIS — L409 Psoriasis, unspecified: Secondary | ICD-10-CM

## 2017-07-18 DIAGNOSIS — F324 Major depressive disorder, single episode, in partial remission: Secondary | ICD-10-CM

## 2017-07-18 DIAGNOSIS — F5109 Other insomnia not due to a substance or known physiological condition: Secondary | ICD-10-CM | POA: Insufficient documentation

## 2017-07-18 DIAGNOSIS — Z9181 History of falling: Secondary | ICD-10-CM

## 2017-07-18 HISTORY — DX: Major depressive disorder, single episode, unspecified: F32.9

## 2017-07-18 NOTE — Progress Notes (Signed)
Received orders from [facility] of care 3/55/97 with certification period 07/04/17 through 09/01/17. Orders are reviewed, signed and faxed.

## 2017-07-19 DIAGNOSIS — G894 Chronic pain syndrome: Secondary | ICD-10-CM | POA: Diagnosis not present

## 2017-07-19 DIAGNOSIS — L309 Dermatitis, unspecified: Secondary | ICD-10-CM | POA: Diagnosis not present

## 2017-07-19 DIAGNOSIS — L408 Other psoriasis: Secondary | ICD-10-CM | POA: Diagnosis not present

## 2017-07-19 DIAGNOSIS — E119 Type 2 diabetes mellitus without complications: Secondary | ICD-10-CM | POA: Diagnosis not present

## 2017-07-20 DIAGNOSIS — E119 Type 2 diabetes mellitus without complications: Secondary | ICD-10-CM | POA: Diagnosis not present

## 2017-07-20 DIAGNOSIS — L408 Other psoriasis: Secondary | ICD-10-CM | POA: Diagnosis not present

## 2017-07-20 DIAGNOSIS — G894 Chronic pain syndrome: Secondary | ICD-10-CM | POA: Diagnosis not present

## 2017-07-20 DIAGNOSIS — L309 Dermatitis, unspecified: Secondary | ICD-10-CM | POA: Diagnosis not present

## 2017-07-21 DIAGNOSIS — L309 Dermatitis, unspecified: Secondary | ICD-10-CM | POA: Diagnosis not present

## 2017-07-21 DIAGNOSIS — G894 Chronic pain syndrome: Secondary | ICD-10-CM | POA: Diagnosis not present

## 2017-07-21 DIAGNOSIS — L408 Other psoriasis: Secondary | ICD-10-CM | POA: Diagnosis not present

## 2017-07-21 DIAGNOSIS — E119 Type 2 diabetes mellitus without complications: Secondary | ICD-10-CM | POA: Diagnosis not present

## 2017-07-26 DIAGNOSIS — L408 Other psoriasis: Secondary | ICD-10-CM | POA: Diagnosis not present

## 2017-07-26 DIAGNOSIS — E119 Type 2 diabetes mellitus without complications: Secondary | ICD-10-CM | POA: Diagnosis not present

## 2017-07-26 DIAGNOSIS — G894 Chronic pain syndrome: Secondary | ICD-10-CM | POA: Diagnosis not present

## 2017-07-26 DIAGNOSIS — L309 Dermatitis, unspecified: Secondary | ICD-10-CM | POA: Diagnosis not present

## 2017-07-27 ENCOUNTER — Telehealth: Payer: Self-pay

## 2017-07-27 NOTE — Telephone Encounter (Signed)
Patient called and left another VM about getting pain medication because he is in pain.   Spoke with patient this time. Informed him our office does not prescribe pain medication at all. Asked him what is hurting and he stated his back and muscles in his legs. He stated he couldn't hardly get to the phone to answer it. Informed him if he is in that much pain then he may need to go to ER to be evaluated. He verbalized understanding.

## 2017-07-27 NOTE — Telephone Encounter (Signed)
Patient called leaving VM that his "pain medication is not working" - "needs more pain medicine ASAP because i'm in a lot of pain."  - Called patient back and was unable to reach him but did leave a VM informing him Dr Army Melia does not prescribe pain medication and that if he is in that amount of pain then he may need to be evaluated at the ER.

## 2017-08-02 DIAGNOSIS — L408 Other psoriasis: Secondary | ICD-10-CM | POA: Diagnosis not present

## 2017-08-02 DIAGNOSIS — E119 Type 2 diabetes mellitus without complications: Secondary | ICD-10-CM | POA: Diagnosis not present

## 2017-08-02 DIAGNOSIS — L309 Dermatitis, unspecified: Secondary | ICD-10-CM | POA: Diagnosis not present

## 2017-08-02 DIAGNOSIS — G894 Chronic pain syndrome: Secondary | ICD-10-CM | POA: Diagnosis not present

## 2017-08-11 ENCOUNTER — Telehealth: Payer: Self-pay | Admitting: Internal Medicine

## 2017-08-11 NOTE — Telephone Encounter (Signed)
Called to schedule Medicare Annual Wellness Visit with Nurse Health Advisor. If patient returns call please schedule AWV with NHA any date   Thank you! For any questions please contact: Jill Alexanders [phone]  Skype [email or web address]

## 2017-08-18 ENCOUNTER — Ambulatory Visit (INDEPENDENT_AMBULATORY_CARE_PROVIDER_SITE_OTHER): Payer: Medicare Other | Admitting: Internal Medicine

## 2017-08-18 ENCOUNTER — Encounter: Payer: Self-pay | Admitting: Internal Medicine

## 2017-08-18 VITALS — BP 118/62 | HR 80 | Resp 14 | Ht 67.0 in | Wt 97.0 lb

## 2017-08-18 DIAGNOSIS — L539 Erythematous condition, unspecified: Secondary | ICD-10-CM

## 2017-08-18 DIAGNOSIS — E1165 Type 2 diabetes mellitus with hyperglycemia: Secondary | ICD-10-CM | POA: Diagnosis not present

## 2017-08-18 DIAGNOSIS — E43 Unspecified severe protein-calorie malnutrition: Secondary | ICD-10-CM

## 2017-08-18 DIAGNOSIS — M5442 Lumbago with sciatica, left side: Secondary | ICD-10-CM | POA: Diagnosis not present

## 2017-08-18 MED ORDER — BACLOFEN 10 MG PO TABS: 10.0000 mg | ORAL_TABLET | Freq: Three times a day (TID) | ORAL | 3 refills | Status: DC | PRN

## 2017-08-18 NOTE — Progress Notes (Signed)
Date:  08/18/2017   Name:  Stephen [name].   DOB:  [birth year]   MRN:  [id number]   Chief Complaint: Diabetes Diabetes  He presents for his follow-up diabetic visit. He has type 2 diabetes mellitus. His disease course has been stable. Pertinent negatives for hypoglycemia include no headaches or tremors. Pertinent negatives for diabetes include no chest pain, no fatigue, no polydipsia and no polyuria. His weight is stable. He monitors blood glucose at home 1-2 x per day. Blood glucose monitoring compliance is good. His breakfast blood glucose is taken between 7-8 am. His breakfast blood glucose range is generally 110-130 mg/dl.  Back Pain  This is a recurrent problem. The current episode started 1 to 4 weeks ago. The problem has been gradually worsening since onset. The pain is present in the lumbar spine. The pain radiates to the left thigh. The pain is moderate. The pain is worse during the day. The symptoms are aggravated by standing and twisting. Pertinent negatives include no abdominal pain, chest pain, dysuria, headaches or numbness. He has tried analgesics for the symptoms. The treatment provided mild relief.  He says he has been trying to eat well but often gets side tracked by other things.  He does not check his blood sugar, he does not take insulin unless he thinks he needs it.  He continues on metformin Dermatitis - improved with daily use of steroid cream and antihistamines.  Lab Results  Component Value Date   HGBA1C 7.0 06/22/2017   Wt Readings from Last 3 Encounters:  08/18/17 97 lb (44 kg)  07/05/17 95 lb (43.1 kg)  02/18/17 103 lb (46.7 kg)      Review of Systems  Constitutional: Negative for appetite change, fatigue and unexpected weight change.  Eyes: Negative for visual disturbance.  Respiratory: Negative for cough, shortness of breath and wheezing.   Cardiovascular: Negative for chest pain, palpitations and leg swelling.  Gastrointestinal: Negative for  abdominal pain and blood in stool.  Endocrine: Negative for polydipsia and polyuria.  Genitourinary: Negative for dysuria and hematuria.  Musculoskeletal: Positive for back pain.  Skin: Positive for rash. Negative for color change.  Neurological: Negative for tremors, numbness and headaches.  Psychiatric/Behavioral: Negative for dysphoric mood.    Patient Active Problem List   Diagnosis Date Noted  . Major depressive disorder, single episode, unspecified 07/18/2017  . Situational insomnia 07/18/2017  . Erythematous condition 07/18/2017  . Hypomagnesemia 07/08/2016  . 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 PVD (peripheral vascular disease) ([place]) 11/17/2015  . Hx of falling 11/17/2015  . Protein-calorie malnutrition, severe 11/09/2015  . 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 Duodenitis 05/06/2015  . Hyponatremia 12/15/2014  . Thrombocytopenia ([place]) 12/15/2014  . Malnutrition ([place]) 12/15/2014  . Tobacco abuse 12/15/2014  . Cholelithiasis 12/15/2014  . Diabetes mellitus type 2, uncontrolled ([place]) 09/18/2014  . Psoriasis 09/16/2014  . Excessive weight loss 09/16/2014    Prior to Admission medications   Medication Sig Start Date End Date Taking? Authorizing Provider  diphenhydrAMINE (BENADRYL) 25 mg capsule TAKE 1 CAPSULE (25 MG TOTAL) BY MOUTH EVERY 4 (FOUR) HOURS AS NEEDED FOR ITCHING. 04/13/17   Glean Hess, MD  gabapentin (NEURONTIN) 100 MG capsule Take 100 mg by mouth 2 (two) times daily.    [provider]  glucose blood (FREESTYLE LITE) test strip Use as instructed 07/21/16   Glean Hess, MD  hydrOXYzine (ATARAX/VISTARIL) 25 MG tablet Take 1 tablet (25 mg total) every 6 (six) hours as needed  by mouth. 02/18/17   Glean Hess, MD  insulin aspart (NOVOLOG FLEXPEN) 100 UNIT/ML FlexPen Inject 4 Units into the skin daily as needed for high blood sugar (for BS > 200). 07/21/16   Glean Hess, MD  Insulin Pen Needle (PEN NEEDLES) 31G X 5 MM MISC 1 each by Does not  apply route daily. 07/21/16   Glean Hess, MD  metFORMIN (GLUCOPHAGE) 500 MG tablet Take 1 tablet (500 mg total) 3 (three) times daily with meals by mouth. Patient taking differently: Take 500 mg by mouth daily.  02/18/17   Glean Hess, MD  oxyCODONE-acetaminophen (PERCOCET/ROXICET) 5-325 MG tablet Take 1 tablet by mouth every 4 (four) hours as needed for severe pain.    [provider]  Skin Protectants, Misc. (MINERIN) CREA Apply topically.    [provider]  triamcinolone ointment (KENALOG) 0.1 % Apply 2 application 2 (two) times daily topically. 02/16/17 02/16/18  Glean Hess, MD    No Known Allergies  Past Surgical History:  Procedure Laterality Date  . COLONOSCOPY  1995   one polyp  . ESOPHAGOGASTRODUODENOSCOPY N/A 05/02/2015   Procedure: ESOPHAGOGASTRODUODENOSCOPY (EGD);  Surgeon: Hulen Luster, MD;  Location: [facility] ENDOSCOPY;  Service: Endoscopy;  Laterality: N/A;    Social History   Tobacco Use  . Smoking status: Current Every Day Smoker    Packs/day: 1.00    Types: Cigarettes  . Smokeless tobacco: Former Systems developer  . Tobacco comment: patient not ready to quit  Substance Use Topics  . Alcohol use: Yes    Alcohol/week: 0.0 oz    Comment: occassional, socially   . Drug use: Yes    Frequency: 2.0 times per week    Types: Marijuana    Comment: marijuana     Medication list has been reviewed and updated.  PHQ 2/9 Scores 06/28/2017 02/18/2017 02/18/2017 01/16/2016  PHQ - 2 Score 0 0 0 0    Physical Exam  Constitutional: He is oriented to person, place, and time. He appears cachectic. No distress.  HENT:  Head: Normocephalic and atraumatic.  Cardiovascular: Normal rate, regular rhythm and normal heart sounds.  Pulmonary/Chest: Effort normal and breath sounds normal. No respiratory distress. He has no wheezes. He has no rhonchi.  Musculoskeletal: Normal range of motion.       Lumbar back: He exhibits bony tenderness and spasm.  Neurological: He is  alert and oriented to person, place, and time. He has normal strength. No sensory deficit. Gait (walking with a cane for support) abnormal.  Reflex Scores:      Patellar reflexes are 1+ on the right side and 1+ on the left side. Skin: Skin is warm and dry. Rash noted.  Erythroderma over arms, legs and palms.  Feet not examined  Psychiatric: He has a normal mood and affect. His behavior is normal. Thought content normal.  Nursing note and vitals reviewed.   BP 118/62   Pulse 80   Resp 14   Ht 5\' 7"  (1.702 m)   Wt 97 lb (44 kg)   BMI 15.19 kg/m   Assessment and Plan: 1. Uncontrolled type 2 diabetes mellitus with hyperglycemia (HCC) Continue metformin Check labs next visit  2. Protein-calorie malnutrition, severe Encourage pt to eat a healthy diet with protein Return in 2 months for follow up and labs  3. Erythroderma improved  4. Acute bilateral low back pain with left-sided sciatica Continue tylenol Use heat - baclofen (LIORESAL) 10 MG tablet; Take 1 tablet (10 mg  total) by mouth 3 (three) times daily as needed for muscle spasms.  Dispense: 90 each; Refill: 3   Meds ordered this encounter  Medications  . baclofen (LIORESAL) 10 MG tablet    Sig: Take 1 tablet (10 mg total) by mouth 3 (three) times daily as needed for muscle spasms.    Dispense:  90 each    Refill:  3    Partially dictated using Editor, commissioning. Any errors are unintentional.  [name], MD [facility]  08/18/2017

## 2017-09-06 ENCOUNTER — Ambulatory Visit: Payer: Self-pay | Admitting: Internal Medicine

## 2017-09-08 ENCOUNTER — Ambulatory Visit: Payer: Self-pay | Admitting: Internal Medicine

## 2017-09-19 ENCOUNTER — Other Ambulatory Visit: Payer: Self-pay | Admitting: Internal Medicine

## 2017-09-19 DIAGNOSIS — L409 Psoriasis, unspecified: Secondary | ICD-10-CM

## 2017-09-20 ENCOUNTER — Other Ambulatory Visit: Payer: Self-pay | Admitting: Internal Medicine

## 2017-09-20 ENCOUNTER — Telehealth: Payer: Self-pay

## 2017-09-20 MED ORDER — TRIAMCINOLONE ACETONIDE 0.1 % EX OINT: 2.0000 "application " | TOPICAL_OINTMENT | Freq: Two times a day (BID) | CUTANEOUS | 0 refills | Status: DC

## 2017-09-20 NOTE — Telephone Encounter (Signed)
I did not get a refill on the cream.  I denied the cephalexin - it is not the correct treatment for his condition.  If something has changed, he will need to be seen.

## 2017-09-20 NOTE — Telephone Encounter (Signed)
Patient has appt on 09/27/17 to discuss treatment. Patient contacted pharmacy [facility], [place], and they told him he needed to contact the office to request a refill since he doesn't have any more at the pharmacy.

## 2017-09-20 NOTE — Telephone Encounter (Signed)
I reordered the cream.

## 2017-09-20 NOTE — Telephone Encounter (Signed)
Patient notified

## 2017-09-20 NOTE — Telephone Encounter (Signed)
Patient called wanting refill on Rx for cream and another medication he could not give the name too. Via refill request from Pharmacy requested Cephalexin 500 mg that was refused. Please advise for cream.

## 2017-09-27 ENCOUNTER — Ambulatory Visit (INDEPENDENT_AMBULATORY_CARE_PROVIDER_SITE_OTHER): Payer: Medicare Other | Admitting: Internal Medicine

## 2017-09-27 ENCOUNTER — Encounter: Payer: Self-pay | Admitting: Internal Medicine

## 2017-09-27 VITALS — BP 138/50 | HR 56 | Temp 98.1°F | Resp 16 | Ht 67.0 in | Wt 101.0 lb

## 2017-09-27 DIAGNOSIS — E1165 Type 2 diabetes mellitus with hyperglycemia: Secondary | ICD-10-CM | POA: Diagnosis not present

## 2017-09-27 DIAGNOSIS — L539 Erythematous condition, unspecified: Secondary | ICD-10-CM

## 2017-09-27 DIAGNOSIS — L409 Psoriasis, unspecified: Secondary | ICD-10-CM

## 2017-09-27 MED ORDER — CEPHALEXIN 500 MG PO CAPS: 500.0000 mg | ORAL_CAPSULE | Freq: Four times a day (QID) | ORAL | 0 refills | Status: AC

## 2017-09-27 MED ORDER — TRIAMCINOLONE ACETONIDE 0.1 % EX OINT: 2.0000 "application " | TOPICAL_OINTMENT | Freq: Two times a day (BID) | CUTANEOUS | 3 refills | Status: AC

## 2017-09-27 NOTE — Progress Notes (Signed)
Date:  09/27/2017   Name:  Stephen [name].   DOB:  1946/02/22   MRN:  [id number]   Chief Complaint: Rash Rash  This is a chronic problem. The rash is diffuse. The rash is characterized by peeling, scaling and itchiness. Pertinent negatives include no fatigue, fever or shortness of breath.  Diabetes  He presents for his follow-up diabetic visit. He has type 2 diabetes mellitus. His disease course has been stable. Pertinent negatives for hypoglycemia include no dizziness, headaches or nervousness/anxiousness. Pertinent negatives for diabetes include no chest pain and no fatigue. His weight is stable. He monitors blood glucose at home 1-2 x per week. There is no change in his home blood glucose trend. His breakfast blood glucose is taken between 6-7 am. His breakfast blood glucose range is generally 110-130 mg/dl.  He takes a small dose of insulin when his BS is high or if he feels like he has excessive urination.  He take metformin daily.   Review of Systems  Constitutional: Negative for chills, fatigue, fever and unexpected weight change.  Respiratory: Negative for shortness of breath and wheezing.   Cardiovascular: Negative for chest pain, palpitations and leg swelling.  Gastrointestinal: Negative for abdominal pain.  Musculoskeletal: Negative for arthralgias.  Skin: Positive for color change and rash.  Neurological: Negative for dizziness and headaches.  Psychiatric/Behavioral: Negative for dysphoric mood and sleep disturbance. The patient is not nervous/anxious.     Patient Active Problem List   Diagnosis Date Noted  . Major depressive disorder, single episode, unspecified 07/18/2017  . Situational insomnia 07/18/2017  . Erythematous condition 07/18/2017  . Hypomagnesemia 07/08/2016  . 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 PVD (peripheral vascular disease) ([place]) 11/17/2015  . Hx of falling 11/17/2015  . Protein-calorie malnutrition, severe 11/09/2015  . Alcoholism in remission ([place])  09/12/2015  . Duodenitis 05/06/2015  . Hyponatremia 12/15/2014  . Thrombocytopenia ([place]) 12/15/2014  . Malnutrition ([place]) 12/15/2014  . Tobacco abuse 12/15/2014  . Cholelithiasis 12/15/2014  . Diabetes mellitus type 2, uncontrolled ([place]) 09/18/2014  . Psoriasis 09/16/2014  . Excessive weight loss 09/16/2014    Prior to Admission medications   Medication Sig Start Date End Date Taking? Authorizing Provider  baclofen (LIORESAL) 10 MG tablet Take 1 tablet (10 mg total) by mouth 3 (three) times daily as needed for muscle spasms. 08/18/17  Yes Glean Hess, MD  diphenhydrAMINE (BENADRYL) 25 mg capsule TAKE 1 CAPSULE (25 MG TOTAL) BY MOUTH EVERY 4 (FOUR) HOURS AS NEEDED FOR ITCHING. 04/13/17  Yes Glean Hess, MD  gabapentin (NEURONTIN) 100 MG capsule Take 100 mg by mouth 2 (two) times daily.   Yes [provider]  glucose blood (FREESTYLE LITE) test strip Use as instructed 07/21/16  Yes Glean Hess, MD  insulin aspart (NOVOLOG FLEXPEN) 100 UNIT/ML FlexPen Inject 4 Units into the skin daily as needed for high blood sugar (for BS > 200). 07/21/16  Yes Glean Hess, MD  Insulin Pen Needle (PEN NEEDLES) 31G X 5 MM MISC 1 each by Does not apply route daily. 07/21/16  Yes Glean Hess, MD  metFORMIN (GLUCOPHAGE) 500 MG tablet Take 1 tablet (500 mg total) 3 (three) times daily with meals by mouth. Patient taking differently: Take 500 mg by mouth daily.  02/18/17  Yes Glean Hess, MD  oxyCODONE-acetaminophen (PERCOCET/ROXICET) 5-325 MG tablet Take 1 tablet by mouth every 4 (four) hours as needed for severe pain.   Yes [provider]  Skin Protectants, Misc. [facility])  CREA Apply topically.   Yes [provider]  triamcinolone ointment (KENALOG) 0.1 % Apply 2 application topically 2 (two) times daily. 09/20/17 09/20/18 Yes Glean Hess, MD  hydrOXYzine (ATARAX/VISTARIL) 25 MG tablet Take 1 tablet (25 mg total) every 6 (six) hours as needed by  mouth. Patient not taking: Reported on 09/27/2017 02/18/17   Glean Hess, MD    No Known Allergies  Past Surgical History:  Procedure Laterality Date  . COLONOSCOPY  1995   one polyp  . ESOPHAGOGASTRODUODENOSCOPY N/A 05/02/2015   Procedure: ESOPHAGOGASTRODUODENOSCOPY (EGD);  Surgeon: Hulen Luster, MD;  Location: [facility] ENDOSCOPY;  Service: Endoscopy;  Laterality: N/A;    Social History   Tobacco Use  . Smoking status: Current Every Day Smoker    Packs/day: 1.00    Types: Cigarettes  . Smokeless tobacco: Former Systems developer  . Tobacco comment: patient not ready to quit  Substance Use Topics  . Alcohol use: Yes    Alcohol/week: 0.0 oz    Comment: occassional, socially   . Drug use: Yes    Frequency: 2.0 times per week    Types: Marijuana    Comment: marijuana     Medication list has been reviewed and updated.  Current Meds  Medication Sig  . baclofen (LIORESAL) 10 MG tablet Take 1 tablet (10 mg total) by mouth 3 (three) times daily as needed for muscle spasms.  . diphenhydrAMINE (BENADRYL) 25 mg capsule TAKE 1 CAPSULE (25 MG TOTAL) BY MOUTH EVERY 4 (FOUR) HOURS AS NEEDED FOR ITCHING.  . gabapentin (NEURONTIN) 100 MG capsule Take 100 mg by mouth 2 (two) times daily.  Marland Kitchen glucose blood (FREESTYLE LITE) test strip Use as instructed  . insulin aspart (NOVOLOG FLEXPEN) 100 UNIT/ML FlexPen Inject 4 Units into the skin daily as needed for high blood sugar (for BS > 200).  . Insulin Pen Needle (PEN NEEDLES) 31G X 5 MM MISC 1 each by Does not apply route daily.  . metFORMIN (GLUCOPHAGE) 500 MG tablet Take 1 tablet (500 mg total) 3 (three) times daily with meals by mouth. (Patient taking differently: Take 500 mg by mouth daily. )  . oxyCODONE-acetaminophen (PERCOCET/ROXICET) 5-325 MG tablet Take 1 tablet by mouth every 4 (four) hours as needed for severe pain.  . Skin Protectants, Misc. (MINERIN) CREA Apply topically.  . triamcinolone ointment (KENALOG) 0.1 % Apply 2 application topically 2  (two) times daily.    PHQ 2/9 Scores 06/28/2017 02/18/2017 02/18/2017 01/16/2016  PHQ - 2 Score 0 0 0 0    Physical Exam  Constitutional: He is oriented to person, place, and time. He appears well-developed. No distress.  HENT:  Head: Normocephalic and atraumatic.  Cardiovascular: Normal rate, regular rhythm, normal heart sounds and normal pulses. Frequent extrasystoles are present.  Pulmonary/Chest: Effort normal and breath sounds normal. No respiratory distress.  Musculoskeletal: Normal range of motion.  Neurological: He is alert and oriented to person, place, and time.  Skin: Skin is warm and dry. No rash noted.  Skin thickening and peeling on both palms Thumbs with erythema, cracking and tenderness General skin survey - thickening along forearms, back of neck - no drainage or odor  Psychiatric: He has a normal mood and affect. His behavior is normal. Thought content normal.  Nursing note and vitals reviewed.   BP (!) 138/50   Pulse (!) 56   Temp 98.1 F (36.7 C) (Oral)   Resp 16   Ht 5\' 7"  (1.702 m)   Wt 101 lb (  45.8 kg)   SpO2 98%   BMI 15.82 kg/m   Assessment and Plan: 1. Psoriasis Continue topical creams daily  2. Erythroderma Keflex 500 mg qid  3. Uncontrolled type 2 diabetes mellitus with hyperglycemia (HCC) Continue metformin daily Insulin dosing is PRN per patient

## 2017-10-10 ENCOUNTER — Ambulatory Visit (INDEPENDENT_AMBULATORY_CARE_PROVIDER_SITE_OTHER): Payer: Medicare Other

## 2017-10-10 VITALS — BP 102/62 | HR 90 | Temp 98.2°F | Resp 12 | Ht 67.0 in | Wt 98.0 lb

## 2017-10-10 DIAGNOSIS — Z9181 History of falling: Secondary | ICD-10-CM

## 2017-10-10 DIAGNOSIS — Z0000 Encounter for general adult medical examination without abnormal findings: Secondary | ICD-10-CM

## 2017-10-10 NOTE — Patient Instructions (Signed)
Mr. Stephen [name] , Thank you for taking time to come for your Medicare Wellness Visit. I appreciate your ongoing commitment to your health goals. Please review the following plan we discussed and let me know if I can assist you in the future.   Screening recommendations/referrals: Colorectal Screening: Declined  Vision and Dental Exams: Recommended annual ophthalmology exams for early detection of glaucoma and other disorders of the eye Recommended annual dental exams for proper oral hygiene  Diabetic Exams: Recommended annual diabetic eye exams for early detection of retinopathy Recommended annual diabetic foot exams for early detection of peripheral neuropathy.  Diabetic Eye Exam: Up to date Diabetic Foot Exam: No longer required  Vaccinations: Influenza vaccine: Overdue Pneumococcal vaccine: Declined Tdap vaccine: Up to date Shingles vaccine: Please call your insurance company to determine your out of pocket expense for the Shingrix vaccine. You may also receive this vaccine at your local pharmacy or Health Dept.  Advanced directives: Advance directive discussed with you today. I have provided a copy for you to complete at home and have notarized. Once this is complete please bring a copy in to our office so we can scan it into your chart.  Goals: Recommend to drink at least 6-8 8oz glasses of water per day.  Next appointment: Please schedule your Annual Wellness Visit with your Nurse Health Advisor in one year.  Preventive Care 74 Years and Older, Male Preventive care refers to lifestyle choices and visits with your health care provider that can promote health and wellness. What does preventive care include?  A yearly physical exam. This is also called an annual well check.  Dental exams once or twice a year.  Routine eye exams. Ask your health care provider how often you should have your eyes checked.  Personal lifestyle choices, including:  Daily care of your teeth and  gums.  Regular physical activity.  Eating a healthy diet.  Avoiding tobacco and drug use.  Limiting alcohol use.  Practicing safe sex.  Taking low doses of aspirin every day.  Taking vitamin and mineral supplements as recommended by your health care provider. What happens during an annual well check? The services and screenings done by your health care provider during your annual well check will depend on your age, overall health, lifestyle risk factors, and family history of disease. Counseling  Your health care provider may ask you questions about your:  Alcohol use.  Tobacco use.  Drug use.  Emotional well-being.  Home and relationship well-being.  Sexual activity.  Eating habits.  History of falls.  Memory and ability to understand (cognition).  Work and work Statistician. Screening  You may have the following tests or measurements:  Height, weight, and BMI.  Blood pressure.  Lipid and cholesterol levels. These may be checked every 5 years, or more frequently if you are over 68 years old.  Skin check.  Lung cancer screening. You may have this screening every year starting at age 27 if you have a 30-pack-year history of smoking and currently smoke or have quit within the past 15 years.  Fecal occult blood test (FOBT) of the stool. You may have this test every year starting at age 62.  Flexible sigmoidoscopy or colonoscopy. You may have a sigmoidoscopy every 5 years or a colonoscopy every 10 years starting at age 32.  Prostate cancer screening. Recommendations will vary depending on your family history and other risks.  Hepatitis C blood test.  Hepatitis B blood test.  Sexually transmitted disease (STD) testing.  Diabetes screening. This is done by checking your blood sugar (glucose) after you have not eaten for a while (fasting). You may have this done every 1-3 years.  Abdominal aortic aneurysm (AAA) screening. You may need this if you are a  current or former smoker.  Osteoporosis. You may be screened starting at age 81 if you are at high risk. Talk with your health care provider about your test results, treatment options, and if necessary, the need for more tests. Vaccines  Your health care provider may recommend certain vaccines, such as:  Influenza vaccine. This is recommended every year.  Tetanus, diphtheria, and acellular pertussis (Tdap, Td) vaccine. You may need a Td booster every 10 years.  Zoster vaccine. You may need this after age 60.  Pneumococcal 13-valent conjugate (PCV13) vaccine. One dose is recommended after age 37.  Pneumococcal polysaccharide (PPSV23) vaccine. One dose is recommended after age 30. Talk to your health care provider about which screenings and vaccines you need and how often you need them. This information is not intended to replace advice given to you by your health care provider. Make sure you discuss any questions you have with your health care provider. Document Released: 04/25/2015 Document Revised: 12/17/2015 Document Reviewed: 01/28/2015 Elsevier Interactive Patient Education  2017 [place] Prevention in the Home Falls can cause injuries. They can happen to people of all ages. There are many things you can do to make your home safe and to help prevent falls. What can I do on the outside of my home?  Regularly fix the edges of walkways and driveways and fix any cracks.  Remove anything that might make you trip as you walk through a door, such as a raised step or threshold.  Trim any bushes or trees on the path to your home.  Use bright outdoor lighting.  Clear any walking paths of anything that might make someone trip, such as rocks or tools.  Regularly check to see if handrails are loose or broken. Make sure that both sides of any steps have handrails.  Any raised decks and porches should have guardrails on the edges.  Have any leaves, snow, or ice cleared  regularly.  Use sand or salt on walking paths during winter.  Clean up any spills in your garage right away. This includes oil or grease spills. What can I do in the bathroom?  Use night lights.  Install grab bars by the toilet and in the tub and shower. Do not use towel bars as grab bars.  Use non-skid mats or decals in the tub or shower.  If you need to sit down in the shower, use a plastic, non-slip stool.  Keep the floor dry. Clean up any water that spills on the floor as soon as it happens.  Remove soap buildup in the tub or shower regularly.  Attach bath mats securely with double-sided non-slip rug tape.  Do not have throw rugs and other things on the floor that can make you trip. What can I do in the bedroom?  Use night lights.  Make sure that you have a light by your bed that is easy to reach.  Do not use any sheets or blankets that are too big for your bed. They should not hang down onto the floor.  Have a firm chair that has side arms. You can use this for support while you get dressed.  Do not have throw rugs and other things on the floor that can  make you trip. What can I do in the kitchen?  Clean up any spills right away.  Avoid walking on wet floors.  Keep items that you use a lot in easy-to-reach places.  If you need to reach something above you, use a strong step stool that has a grab bar.  Keep electrical cords out of the way.  Do not use floor polish or wax that makes floors slippery. If you must use wax, use non-skid floor wax.  Do not have throw rugs and other things on the floor that can make you trip. What can I do with my stairs?  Do not leave any items on the stairs.  Make sure that there are handrails on both sides of the stairs and use them. Fix handrails that are broken or loose. Make sure that handrails are as long as the stairways.  Check any carpeting to make sure that it is firmly attached to the stairs. Fix any carpet that is loose  or worn.  Avoid having throw rugs at the top or bottom of the stairs. If you do have throw rugs, attach them to the floor with carpet tape.  Make sure that you have a light switch at the top of the stairs and the bottom of the stairs. If you do not have them, ask someone to add them for you. What else can I do to help prevent falls?  Wear shoes that:  Do not have high heels.  Have rubber bottoms.  Are comfortable and fit you well.  Are closed at the toe. Do not wear sandals.  If you use a stepladder:  Make sure that it is fully opened. Do not climb a closed stepladder.  Make sure that both sides of the stepladder are locked into place.  Ask someone to hold it for you, if possible.  Clearly mark and make sure that you can see:  Any grab bars or handrails.  First and last steps.  Where the edge of each step is.  Use tools that help you move around (mobility aids) if they are needed. These include:  Canes.  Walkers.  Scooters.  Crutches.  Turn on the lights when you go into a dark area. Replace any light bulbs as soon as they burn out.  Set up your furniture so you have a clear path. Avoid moving your furniture around.  If any of your floors are uneven, fix them.  If there are any pets around you, be aware of where they are.  Review your medicines with your doctor. Some medicines can make you feel dizzy. This can increase your chance of falling. Ask your doctor what other things that you can do to help prevent falls. This information is not intended to replace advice given to you by your health care provider. Make sure you discuss any questions you have with your health care provider. Document Released: 01/23/2009 Document Revised: 09/04/2015 Document Reviewed: 05/03/2014 Elsevier Interactive Patient Education  2017 [employer].

## 2017-10-10 NOTE — Progress Notes (Signed)
Subjective:   Stephen [name]. is a 72 y.o. male who presents for an Initial Medicare Annual Wellness Visit.  Review of Systems  N/A Cardiac Risk Factors include: advanced age (>79men, >65 women);diabetes mellitus;sedentary lifestyle;smoking/ tobacco exposure    Objective:    Today's Vitals   10/10/17 0815  BP: 102/62  Pulse: 90  Resp: 12  Temp: 98.2 F (36.8 C)  TempSrc: Oral  SpO2: 94%  Weight: 98 lb (44.5 kg)  Height: 5\' 7"  (1.702 m)   Body mass index is 15.35 kg/m.   Declined my offer to refer to Nutrition for dietary education.  Advanced Directives 10/10/2017 06/28/2017 06/16/2016 06/15/2016 01/16/2016 11/17/2015 11/07/2015  Does Patient Have a Medical Advance Directive? No No No No No No No  Would patient like information on creating a medical advance directive? Yes (MAU/Ambulatory/Procedural Areas - Information given) No - Patient declined No - Patient declined - Yes - Educational materials given Yes - Educational materials given No - patient declined information    Current Medications (verified) Outpatient Encounter Medications as of 10/10/2017  Medication Sig  . baclofen (LIORESAL) 10 MG tablet Take 1 tablet (10 mg total) by mouth 3 (three) times daily as needed for muscle spasms.  . diphenhydrAMINE (BENADRYL) 25 mg capsule TAKE 1 CAPSULE (25 MG TOTAL) BY MOUTH EVERY 4 (FOUR) HOURS AS NEEDED FOR ITCHING.  Marland Kitchen glucose blood (FREESTYLE LITE) test strip Use as instructed  . insulin aspart (NOVOLOG FLEXPEN) 100 UNIT/ML FlexPen Inject 4 Units into the skin daily as needed for high blood sugar (for BS > 200).  . Insulin Pen Needle (PEN NEEDLES) 31G X 5 MM MISC 1 each by Does not apply route daily.  . metFORMIN (GLUCOPHAGE) 500 MG tablet Take 1 tablet (500 mg total) 3 (three) times daily with meals by mouth. (Patient taking differently: Take 500 mg by mouth daily. )  . oxyCODONE-acetaminophen (PERCOCET/ROXICET) 5-325 MG tablet Take 1 tablet by mouth every 4 (four) hours as needed  for severe pain.  . Skin Protectants, Misc. (MINERIN) CREA Apply topically.  . triamcinolone ointment (KENALOG) 0.1 % Apply 2 application topically 2 (two) times daily.  Marland Kitchen gabapentin (NEURONTIN) 100 MG capsule Take 100 mg by mouth 2 (two) times daily.  . hydrOXYzine (ATARAX/VISTARIL) 25 MG tablet Take 1 tablet (25 mg total) every 6 (six) hours as needed by mouth. (Patient not taking: Reported on 09/27/2017)   No facility-administered encounter medications on file as of 10/10/2017.     Allergies (verified) Patient has no known allergies.   History: Past Medical History:  Diagnosis Date  . Diabetes mellitus without complication ([place])   . Psoriasis   . Skin abnormalities    Past Surgical History:  Procedure Laterality Date  . COLONOSCOPY  1995   one polyp  . ESOPHAGOGASTRODUODENOSCOPY N/A 05/02/2015   Procedure: ESOPHAGOGASTRODUODENOSCOPY (EGD);  Surgeon: Hulen Luster, MD;  Location: [facility] ENDOSCOPY;  Service: Endoscopy;  Laterality: N/A;   Family History  Problem Relation Age of Onset  . Heart disease Mother   . Migraines Father   . Aneurysm Father   . Cancer Brother    Social History   Socioeconomic History  . Marital status: Divorced    Spouse name: Not on file  . Number of children: 1  . Years of education: Not on file  . Highest education level: 8th grade  Occupational History  . Occupation: Retired  Scientific laboratory technician  . Financial resource strain: Not hard at all  . Food insecurity:  Worry: Never true    Inability: Never true  . Transportation needs:    Medical: No    Non-medical: No  Tobacco Use  . Smoking status: Current Every Day Smoker    Packs/day: 0.00    Years: 65.00    Pack years: 0.00    Types: Cigarettes, Cigars  . Smokeless tobacco: Former Network engineer and Sexual Activity  . Alcohol use: Not Currently    Alcohol/week: 0.0 oz  . Drug use: Not Currently  . Sexual activity: Not Currently  Lifestyle  . Physical activity:    Days per week: 0 days     Minutes per session: 0 min  . Stress: Not at all  Relationships  . Social connections:    Talks on phone: Patient refused    Gets together: Patient refused    Attends religious service: Patient refused    Active member of club or organization: Patient refused    Attends meetings of clubs or organizations: Patient refused    Relationship status: Divorced  Other Topics Concern  . Not on file  Social History Narrative   Illiterate   Tobacco Counseling Ready to quit: No Counseling given: Yes  Clinical Intake:  Pre-visit preparation completed: Yes  Pain : No/denies pain   BMI - recorded: 15.35 Nutritional Status: BMI <19  Underweight Nutritional Risks: None Nutrition Risk Assessment: Has the patient had any N/V/D within the last 2 months?  Yes, diarrhea Does the patient have any non-healing wounds?  No Has the patient had any unintentional weight loss or weight gain?  No  Is the patient diabetic?  Yes If diabetic, was a CBG obtained today?  No Did the patient bring in their glucometer from home?  No Comments: Pt monitors CBG's 1-2 times per week. Denies any financial strains with the device or supplies.  Diabetic Exams: Diabetic Eye Exam: Completed 02/06/17 Diabetic Foot Exam: No longer required  How often do you need to have someone help you when you read instructions, pamphlets, or other written materials from your doctor or pharmacy?: 1 - Never  Interpreter Needed?: No  Information entered by :: AEversole, LPN  Activities of Daily Living In your present state of health, do you have any difficulty performing the following activities: 10/10/2017 06/28/2017  Hearing? N N  Comment denies hearing aids -  Vision? N N  Comment wears eyeglasses -  Difficulty concentrating or making decisions? N N  Walking or climbing stairs? Y Y  Comment joint pain -  Dressing or bathing? [place] errands, shopping? [place] and eating ? N Y  Comment denies dentures -  Using the Toilet? N Y  In the past six months, have you accidently leaked urine? N Y  Do you have problems with loss of bowel control? Y Y  Comment diarrhea -  Managing your Medications? N N  Managing your Finances? N N  Housekeeping or managing your Housekeeping? [place] care -  Some recent data might be hidden     Immunizations and Health Maintenance Immunization History  Administered Date(s) Administered  . Tdap 08/01/2013   There are no preventive care reminders to display for this patient.  Patient Care Team: Glean Hess, MD as PCP - General (Family Medicine)  Indicate any recent Medical Services you may have received from other than [facility] providers in the past year (  date may be approximate).    Assessment:   This is a routine wellness examination for Dusty.  Hearing/Vision screen Vision Screening Comments: Sees Dr. Wyatt Portela for annual eye exams  Dietary issues and exercise activities discussed: Current Exercise Habits: The patient does not participate in regular exercise at present, Exercise limited by: None identified  Goals    . DIET - INCREASE WATER INTAKE     Recommend to drink at least 6-8 8oz glasses of water per day.      Depression Screen PHQ 2/9 Scores 10/10/2017 06/28/2017 02/18/2017 02/18/2017  PHQ - 2 Score 0 0 0 0  PHQ- 9 Score 0 - - -    Fall Risk Fall Risk  10/10/2017 06/28/2017 02/18/2017 01/16/2016 12/18/2015  Falls in the past year? Yes Yes No No No  Number falls in past yr: 2 or more 1 - - -  Injury with Fall? No No - - -  Comment - - - - -  Risk Factor Category  High Fall Risk - - - -  Risk for fall due to : Impaired vision;Impaired balance/gait;Medication side effect;History of fall(s) History of fall(s);Impaired balance/gait;Impaired mobility - - -  Follow up Falls evaluation completed;Education provided;Falls prevention discussed Falls prevention discussed - - -     FALL RISK PREVENTION PERTAINING TO HOME: Is your home free of loose throw rugs in walkways, pet beds, electrical cords, etc? Yes Is there adequate lighting in your home to reduce risk of falls?  Yes Are there stairs in or around your home WITH handrails? Yes  ASSISTIVE DEVICES UTILIZED TO PREVENT FALLS: Use of a cane, walker or w/c? Yes, cane Grab bars in the bathroom? No  Shower chair or a place to sit while bathing? Yes An elevated toilet seat or a handicapped toilet? No  Timed Get Up and Go Performed: Yes. Pt ambulated 10 feet within 12 sec. Gait slow, steady and with the use of an assistive device. No intervention required at this time. Fall risk prevention has been discussed.  Community Resource Referral:  Data processing manager Referral sent to Care Guide for installation of grab bars in the shower and an elevated toilet seat. Also requesting life alert.  Cognitive Function:     6CIT Screen 10/10/2017  What Year? 0 points  What month? 0 points  What time? 0 points  Count back from 20 0 points  Months in reverse 4 points  Repeat phrase 4 points  Total Score 8    Screening Tests Health Maintenance  Topic Date Due  . URINE MICROALBUMIN  02/18/2018 (Originally 03/25/1956)  . COLONOSCOPY  02/18/2018 (Originally 03/25/1994)  . Hepatitis C Screening  02/18/2018 (Originally 16-Feb-1946)  . PNA vac Low Risk Adult (1 of 2 - PCV13) 02/18/2018 (Originally 03/25/2009)  . INFLUENZA VACCINE  11/10/2017  . HEMOGLOBIN A1C  12/23/2017  . OPHTHALMOLOGY EXAM  02/06/2018  . TETANUS/TDAP  08/02/2023  . FOOT EXAM  Discontinued    Qualifies for Shingles Vaccine? Yes. Due for Shingrix. Education has been provided regarding the importance of this vaccine. Pt has been advised to call his insurance company to determine his out of pocket expense. Advised he may also receive this vaccine at his local pharmacy or Health Dept. Verbalized acceptance and understanding.  Overdue for Flu vaccine.  Education has been provided regarding the importance of this vaccine and advised to receive when available. Verbalized acceptance and understanding.  Due for Pneumoccocal vaccine. Declined my offer to administer today. Education has been provided regarding the  importance of this vaccine but still declined. Pt has been advised to call our office if he should change his mind and wish to receive this vaccine. Also advised he may receive this vaccine at his local pharmacy or Health Dept. Pt is aware to provide a copy of his vaccination record if he chooses to receive this vaccine at his local pharmacy. Verbalized acceptance and understanding.  Cancer Screenings: Lung: Low Dose CT Chest recommended if Age 50-80 years, 30 pack-year currently smoking OR have quit w/in 15years. Patient does qualify. Declined to be referred to [facility] for Lung Cancer screening Colorectal: Declined my offer to refer to GI, FOBT or Cologuard  Additional Screenings: Hepatitis C Screening: Declined    Plan:  I have personally reviewed and addressed the Medicare Annual Wellness questionnaire and have noted the following in the patient's chart:  A. Medical and social history B. Use of alcohol, tobacco or illicit drugs  C. Current medications and supplements D. Functional ability and status E.  Nutritional status F.  Physical activity G. Advance directives H. List of other physicians I.  Hospitalizations, surgeries, and ER visits in previous 12 months J.  [place] such as hearing and vision if needed, cognitive and depression L. Referrals and appointments  In addition, I have reviewed and discussed with patient certain preventive protocols, quality metrics, and best practice recommendations. A written personalized care plan for preventive services as well as general preventive health recommendations were provided to patient.  Signed,  Aleatha Borer, LPN Nurse Health Advisor  MD Recommendations: Due  for Shingrix. Education has been provided regarding the importance of this vaccine. Pt has been advised to call his insurance company to determine his out of pocket expense. Advised he may also receive this vaccine at his local pharmacy or Health Dept. Verbalized acceptance and understanding.  Overdue for Flu vaccine. Education has been provided regarding the importance of this vaccine and advised to receive when available. Verbalized acceptance and understanding.  Due for Pneumoccocal vaccine. Declined my offer to administer today. Education has been provided regarding the importance of this vaccine but still declined. Pt has been advised to call our office if he should change his mind and wish to receive this vaccine. Also advised he may receive this vaccine at his local pharmacy or Health Dept. Pt is aware to provide a copy of his vaccination record if he chooses to receive this vaccine at his local pharmacy. Verbalized acceptance and understanding.  Lung Cancer Screening: Patient does qualify. Declined to be referred to [facility] for Lung Cancer screening  Colorectal: Declined my offer to refer to GI, FOBT or Cologuard  Hepatitis C Screening: Declined  BMI 15.35. Declined my offer to refer to Nutrition for dietary education.

## 2017-10-14 ENCOUNTER — Other Ambulatory Visit: Payer: Self-pay | Admitting: Internal Medicine

## 2017-10-14 DIAGNOSIS — E1100 Type 2 diabetes mellitus with hyperosmolarity without nonketotic hyperglycemic-hyperosmolar coma (NKHHC): Secondary | ICD-10-CM

## 2017-10-18 ENCOUNTER — Ambulatory Visit (INDEPENDENT_AMBULATORY_CARE_PROVIDER_SITE_OTHER): Payer: Medicare Other | Admitting: Internal Medicine

## 2017-10-18 ENCOUNTER — Encounter: Payer: Self-pay | Admitting: Internal Medicine

## 2017-10-18 VITALS — BP 136/80 | HR 80 | Ht 70.0 in | Wt 97.6 lb

## 2017-10-18 DIAGNOSIS — E1165 Type 2 diabetes mellitus with hyperglycemia: Secondary | ICD-10-CM | POA: Diagnosis not present

## 2017-10-18 DIAGNOSIS — F1021 Alcohol dependence, in remission: Secondary | ICD-10-CM

## 2017-10-18 DIAGNOSIS — L539 Erythematous condition, unspecified: Secondary | ICD-10-CM | POA: Diagnosis not present

## 2017-10-18 MED ORDER — METFORMIN HCL 500 MG PO TABS: 500.0000 mg | ORAL_TABLET | Freq: Four times a day (QID) | ORAL | 0 refills | Status: DC

## 2017-10-18 NOTE — Progress Notes (Signed)
Date:  10/18/2017   Name:  Stephen [name].   DOB:  October 29, 1945   MRN:  [id number]   Chief Complaint: No chief complaint on file. Diabetes  He presents for his follow-up diabetic visit. He has type 2 diabetes mellitus. His disease course has been stable. Pertinent negatives for hypoglycemia include no dizziness or headaches. Pertinent negatives for diabetes include no chest pain and no fatigue. Current diabetic treatment includes oral agent (dual therapy) (metformin qid and insulin PRN). He is compliant with treatment most of the time.  Rash - he is still using topical oinments and benedryl for itching.  Skin improved somewhat after recent antibiotics. Alcohol use - pt states that he not drinking any alcohol.  He consumes mostly water.  Lab Results  Component Value Date   HGBA1C 7.0 06/22/2017   Lab Results  Component Value Date   CREATININE 0.7 07/01/2016   BUN 10 07/01/2016   NA 138 07/01/2016   K 4.3 07/01/2016   CL 103 06/16/2016   CO2 21 (L) 06/16/2016     Review of Systems  Constitutional: Negative for chills, fatigue and unexpected weight change.  Eyes: Negative for visual disturbance.  Respiratory: Negative for cough, chest tightness and shortness of breath.   Cardiovascular: Negative for chest pain, palpitations and leg swelling.  Gastrointestinal: Negative for constipation, diarrhea, nausea and vomiting.  Genitourinary: Positive for frequency.  Musculoskeletal: Positive for gait problem. Negative for arthralgias.  Skin: Positive for rash (improved).  Neurological: Negative for dizziness and headaches.  Psychiatric/Behavioral: Negative for dysphoric mood and sleep disturbance.    Patient Active Problem List   Diagnosis Date Noted  . Major depressive disorder, single episode, unspecified 07/18/2017  . Situational insomnia 07/18/2017  . Erythematous condition 07/18/2017  . Hypomagnesemia 07/08/2016  . 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 PVD (peripheral vascular  disease) ([place]) 11/17/2015  . Hx of falling 11/17/2015  . Protein-calorie malnutrition, severe 11/09/2015  . Alcoholism in remission ([place]) 09/12/2015  . Duodenitis 05/06/2015  . Hyponatremia 12/15/2014  . Thrombocytopenia ([place]) 12/15/2014  . Malnutrition ([place]) 12/15/2014  . Tobacco abuse 12/15/2014  . Cholelithiasis 12/15/2014  . Diabetes mellitus type 2, uncontrolled ([place]) 09/18/2014  . Psoriasis 09/16/2014  . Excessive weight loss 09/16/2014    Prior to Admission medications   Medication Sig Start Date End Date Taking? Authorizing Provider  baclofen (LIORESAL) 10 MG tablet Take 1 tablet (10 mg total) by mouth 3 (three) times daily as needed for muscle spasms. 08/18/17   Glean Hess, MD  diphenhydrAMINE (BENADRYL) 25 mg capsule TAKE 1 CAPSULE (25 MG TOTAL) BY MOUTH EVERY 4 (FOUR) HOURS AS NEEDED FOR ITCHING. 04/13/17   Glean Hess, MD  gabapentin (NEURONTIN) 100 MG capsule Take 100 mg by mouth 2 (two) times daily.    [provider]  glucose blood (ONE TOUCH ULTRA TEST) test strip TEST BY OTHER ROUTE DAILY AT 0600. 10/14/17   Glean Hess, MD  hydrOXYzine (ATARAX/VISTARIL) 25 MG tablet Take 1 tablet (25 mg total) every 6 (six) hours as needed by mouth. Patient not taking: Reported on 09/27/2017 02/18/17   Glean Hess, MD  insulin aspart (NOVOLOG FLEXPEN) 100 UNIT/ML FlexPen Inject 4 Units into the skin daily as needed for high blood sugar (for BS > 200). 07/21/16   Glean Hess, MD  Insulin Pen Needle (PEN NEEDLES) 31G X 5 MM MISC 1 each by Does not apply route daily. 07/21/16   Glean Hess, MD  metFORMIN (GLUCOPHAGE) 500  MG tablet Take 1 tablet (500 mg total) 3 (three) times daily with meals by mouth. Patient taking differently: Take 500 mg by mouth daily.  02/18/17   Glean Hess, MD  oxyCODONE-acetaminophen (PERCOCET/ROXICET) 5-325 MG tablet Take 1 tablet by mouth every 4 (four) hours as needed for severe pain.    [provider]  Skin  Protectants, Misc. (MINERIN) CREA Apply topically.    [provider]  triamcinolone ointment (KENALOG) 0.1 % Apply 2 application topically 2 (two) times daily. 09/27/17 09/27/18  Glean Hess, MD    No Known Allergies  Past Surgical History:  Procedure Laterality Date  . COLONOSCOPY  1995   one polyp  . ESOPHAGOGASTRODUODENOSCOPY N/A 05/02/2015   Procedure: ESOPHAGOGASTRODUODENOSCOPY (EGD);  Surgeon: Hulen Luster, MD;  Location: [facility] ENDOSCOPY;  Service: Endoscopy;  Laterality: N/A;    Social History   Tobacco Use  . Smoking status: Current Every Day Smoker    Packs/day: 0.00    Years: 65.00    Pack years: 0.00    Types: Cigarettes, Cigars  . Smokeless tobacco: Former Systems developer  . Tobacco comment: discussed options of quitting  Substance Use Topics  . Alcohol use: Not Currently    Alcohol/week: 0.0 oz  . Drug use: Not Currently     Medication list has been reviewed and updated.  No outpatient medications have been marked as taking for the 10/18/17 encounter (Office Visit) with Glean Hess, MD.    [facility] 2/9 Scores 10/10/2017 06/28/2017 02/18/2017 02/18/2017  PHQ - 2 Score 0 0 0 0  PHQ- 9 Score 0 - - -    Physical Exam  Constitutional: He is oriented to person, place, and time. He appears cachectic. No distress.  HENT:  Head: Normocephalic and atraumatic.  Neck: Normal range of motion. Neck supple.  Cardiovascular: Normal rate, regular rhythm and normal heart sounds.  Pulmonary/Chest: Effort normal and breath sounds normal. No respiratory distress.  Musculoskeletal: Normal range of motion.  Lymphadenopathy:    He has no cervical adenopathy.  Neurological: He is alert and oriented to person, place, and time.  Skin: Skin is warm and dry. Rash noted. There is erythema.  Psychiatric: He has a normal mood and affect. His speech is normal and behavior is normal. Thought content normal.  Nursing note and vitals reviewed.   BP 136/80   Pulse 80   Ht 5\' 10"  (1.778 m)    Wt 97 lb 9.6 oz (44.3 kg)   BMI 14.00 kg/m   Assessment and Plan: 1. Uncontrolled type 2 diabetes mellitus with hyperglycemia (HCC) Continue metformin qid Insulin only PRN - Basic metabolic panel - Hemoglobin A1c - metFORMIN (GLUCOPHAGE) 500 MG tablet; Take 1 tablet (500 mg total) by mouth 4 (four) times daily.  Dispense: 360 tablet; Refill: 0  2. Alcoholism in remission ([place]) Pt congratulated on remaining sober  3. Erythroderma Stable skin changes on leg, hands Continue current therapy   Meds ordered this encounter  Medications  . metFORMIN (GLUCOPHAGE) 500 MG tablet    Sig: Take 1 tablet (500 mg total) by mouth 4 (four) times daily.    Dispense:  360 tablet    Refill:  0    Partially dictated using Editor, commissioning. Any errors are unintentional.  [name], MD [facility]  10/18/2017

## 2017-11-21 DIAGNOSIS — E1165 Type 2 diabetes mellitus with hyperglycemia: Secondary | ICD-10-CM | POA: Diagnosis not present

## 2017-11-22 LAB — BASIC METABOLIC PANEL
BUN / CREAT RATIO: 14 (ref 10–24)
BUN: 12 mg/dL (ref 8–27)
CALCIUM: 8.7 mg/dL (ref 8.6–10.2)
CHLORIDE: 104 mmol/L (ref 96–106)
CO2: 16 mmol/L — ABNORMAL LOW (ref 20–29)
Creatinine, Ser: 0.83 mg/dL (ref 0.76–1.27)
GFR calc non Af Amer: 87 mL/min/{1.73_m2} (ref >59)
GFR, EST AFRICAN AMERICAN: 101 mL/min/{1.73_m2} (ref >59)
GLUCOSE: 59 mg/dL — AB (ref 65–99)
Potassium: 4 mmol/L (ref 3.5–5.2)
Sodium: 140 mmol/L (ref 134–144)

## 2017-11-22 LAB — HEMOGLOBIN A1C
Est. average glucose Bld gHb Est-mCnc: 148 mg/dL
Hgb A1c MFr Bld: 6.8 % — ABNORMAL HIGH (ref 4.8–5.6)

## 2017-11-29 ENCOUNTER — Telehealth: Payer: Self-pay

## 2017-11-29 ENCOUNTER — Ambulatory Visit
Admission: EM | Admit: 2017-11-29 | Discharge: 2017-11-29 | Disposition: A | Discharge status: Home / Self Care | Payer: Medicare Other | Attending: Family Medicine | Admitting: Family Medicine

## 2017-11-29 ENCOUNTER — Encounter: Payer: Self-pay | Admitting: Emergency Medicine

## 2017-11-29 ENCOUNTER — Other Ambulatory Visit: Payer: Self-pay

## 2017-11-29 DIAGNOSIS — R600 Localized edema: Secondary | ICD-10-CM | POA: Diagnosis not present

## 2017-11-29 DIAGNOSIS — L539 Erythematous condition, unspecified: Secondary | ICD-10-CM | POA: Diagnosis not present

## 2017-11-29 NOTE — Discharge Instructions (Signed)
Elevate your legs above the level of your heart most of the time.  Wear compression hose during the day and may remove at nighttime

## 2017-11-29 NOTE — ED Triage Notes (Signed)
Patient in today c/o swelling of his feet x 2-3 days and rash since 2009 progressively getting worse. Patient has seen PCP and dermatology for rash.

## 2017-11-29 NOTE — Telephone Encounter (Signed)
Patient would like to schedule an appt with Dr Army Melia. Please call him to schedule.  Thank you.

## 2017-11-29 NOTE — ED Provider Notes (Signed)
[facility]    CSN: [id number] Arrival date & time: 11/29/17  1641     History   Chief Complaint Chief Complaint  Patient presents with  . Foot Swelling  . Rash    HPI Griffin Gerrard. is a 72 y.o. male.   HPI  72 year old male with multiple comorbidities since with complaint of swelling of his feet for 2 to 3 days.  Mentions a rash that has had since 2009 that has been progressively worsening.  Review of his medical records he has a history of erythroderma as well as psoriasis.  Been treated appropriately for years.  The most pressing problem appears to be the pedal edema that he is exhibiting today.  He denies any orthopnea or PND not having difficulty breathing.  He continues to smoke a pack of cigarettes daily.  Does admit to sitting for long periods of time.  He states that he does have compression hose at home he thinks but is not certain.       Past Medical History:  Diagnosis Date  . Diabetes mellitus without complication ([place])   . Major depressive disorder, single episode, unspecified 07/18/2017  . Psoriasis   . Skin abnormalities     Patient Active Problem List   Diagnosis Date Noted  . Situational insomnia 07/18/2017  . Erythematous condition 07/18/2017  . Hypomagnesemia 07/08/2016  . 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 PVD (peripheral vascular disease) ([place]) 11/17/2015  . Hx of falling 11/17/2015  . Protein-calorie malnutrition, severe 11/09/2015  . 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 Duodenitis 05/06/2015  . Hyponatremia 12/15/2014  . Thrombocytopenia ([place]) 12/15/2014  . Malnutrition ([place]) 12/15/2014  . Tobacco abuse 12/15/2014  . Cholelithiasis 12/15/2014  . Diabetes mellitus type 2, uncontrolled ([place]) 09/18/2014  . Psoriasis 09/16/2014  . Excessive weight loss 09/16/2014    Past Surgical History:  Procedure Laterality Date  . COLONOSCOPY  1995   one polyp  . ESOPHAGOGASTRODUODENOSCOPY N/A 05/02/2015   Procedure:  ESOPHAGOGASTRODUODENOSCOPY (EGD);  Surgeon: Hulen Luster, MD;  Location: [facility] ENDOSCOPY;  Service: Endoscopy;  Laterality: N/A;       Home Medications    Prior to Admission medications   Medication Sig Start Date End Date Taking? Authorizing Provider  baclofen (LIORESAL) 10 MG tablet Take 1 tablet (10 mg total) by mouth 3 (three) times daily as needed for muscle spasms. 08/18/17  Yes Glean Hess, MD  diphenhydrAMINE (BENADRYL) 25 mg capsule TAKE 1 CAPSULE (25 MG TOTAL) BY MOUTH EVERY 4 (FOUR) HOURS AS NEEDED FOR ITCHING. 04/13/17  Yes Glean Hess, MD  metFORMIN (GLUCOPHAGE) 500 MG tablet Take 1 tablet (500 mg total) by mouth 4 (four) times daily. 10/18/17  Yes Glean Hess, MD  naproxen sodium (ALEVE) 220 MG tablet Take 440 mg by mouth daily as needed.   Yes [provider]  Skin Protectants, Misc. (MINERIN) CREA Apply topically.   Yes [provider]  triamcinolone ointment (KENALOG) 0.1 % Apply 2 application topically 2 (two) times daily. 09/27/17 09/27/18 Yes Glean Hess, MD  glucose blood (ONE TOUCH ULTRA TEST) test strip TEST BY OTHER ROUTE DAILY AT 0600. 10/14/17   Glean Hess, MD  insulin aspart (NOVOLOG FLEXPEN) 100 UNIT/ML FlexPen Inject 4 Units into the skin daily as needed for high blood sugar (for BS > 200). 07/21/16   Glean Hess, MD  Insulin Pen Needle (PEN NEEDLES) 31G X 5 MM MISC 1 each by Does not apply route daily. 07/21/16  Glean Hess, MD    Family History Family History  Problem Relation Age of Onset  . Heart disease Mother   . Migraines Father   . Aneurysm Father   . Cancer Brother     Social History Social History   Tobacco Use  . Smoking status: Current Every Day Smoker    Packs/day: 0.50    Years: 65.00    Pack years: 32.50    Types: Cigarettes, Cigars  . Smokeless tobacco: Former Systems developer  . Tobacco comment: discussed options of quitting  Substance Use Topics  . Alcohol use: Not Currently    Alcohol/week:  0.0 standard drinks  . Drug use: Not Currently     Allergies   Patient has no known allergies.   Review of Systems Review of Systems  Constitutional: Positive for activity change. Negative for appetite change, chills, fatigue and fever.  Cardiovascular: Positive for leg swelling.  All other systems reviewed and are negative.    Physical Exam Triage Vital Signs ED Triage Vitals  Enc Vitals Group     BP 11/29/17 1705 (!) 139/58     Pulse Rate 11/29/17 1705 84     Resp 11/29/17 1705 16     Temp 11/29/17 1705 98.1 F (36.7 C)     Temp Source 11/29/17 1705 Oral     SpO2 11/29/17 1705 97 %     Weight 11/29/17 1704 98 lb (44.5 kg)     Height 11/29/17 1704 5\' 10"  (1.778 m)     Head Circumference --      Peak Flow --      Pain Score 11/29/17 1703 4     Pain Loc --      Pain Edu? --      Excl. in [place]? --    No data found.  Updated Vital Signs BP (!) 139/58 (BP Location: Left Arm)   Pulse 84   Temp 98.1 F (36.7 C) (Oral)   Resp 16   Ht 5\' 10"  (1.778 m)   Wt 98 lb (44.5 kg)   SpO2 97%   BMI 14.06 kg/m   Visual Acuity Right Eye Distance:   Left Eye Distance:   Bilateral Distance:    Right Eye Near:   Left Eye Near:    Bilateral Near:     Physical Exam  Constitutional: He is oriented to person, place, and time. He appears well-developed and well-nourished. No distress.  HENT:  Head: Normocephalic.  Eyes: Pupils are equal, round, and reactive to light.  Neck: Normal range of motion.  Pulmonary/Chest: Effort normal and breath sounds normal.  Musculoskeletal: Normal range of motion. He exhibits edema.  Examination of the lower extremities shows 11+ pitting edema of the pretibial areas and 2+ of the ankles and feet.  This is likely a chronic condition and from dependent edema.  Examination of the skin shows thickening and scaling over the bony prominences.  Is also is a very chronic problem currently being treated by his primary care physician Dr. Army Melia.    Neurological: He is alert and oriented to person, place, and time.  Skin: Skin is warm and dry. Rash noted. He is not diaphoretic.  Psychiatric: He has a normal mood and affect. His behavior is normal. Judgment and thought content normal.  Nursing note and vitals reviewed.    UC Treatments / Results  Labs (all labs ordered are listed, but only abnormal results are displayed) Labs Reviewed - No data to display  EKG None  Radiology No results found.  Procedures Procedures (including critical care time)  Medications Ordered in UC Medications - No data to display  Initial Impression / Assessment and Plan / UC Course  I have reviewed the triage vital signs and the nursing notes.  Pertinent labs & imaging results that were available during my care of the patient were reviewed by me and considered in my medical decision making (see chart for details).     Plan: 1. Test/x-ray results and diagnosis reviewed with patient 2. rx as per orders; risks, benefits, potential side effects reviewed with patient 3. Recommend supportive treatment with frequent elevation of his extremities above his heart for sufficient time to control swelling.  Recommended the use of compression hose during the daytime and may remove them at nighttime.  Thinks he has compression hose at home but if not I have given him a prescription for medium compression.  Because of thinness in his legs are I have recommended that he have them measured properly. 4. F/u prn if symptoms worsen or don't improve  Final Clinical Impressions(s) / UC Diagnoses   Final diagnoses:  Pedal edema  Erythroderma     Discharge Instructions     Elevate your legs above the level of your heart most of the time.  Wear compression hose during the day and may remove at nighttime   ED Prescriptions    None     Controlled Substance Prescriptions [place] Controlled Substance Registry consulted? Not Applicable   Lorin Picket,  PA-C 11/29/17 [id number]

## 2017-12-06 ENCOUNTER — Telehealth: Payer: Self-pay

## 2017-12-06 DIAGNOSIS — Z87898 Personal history of other specified conditions: Secondary | ICD-10-CM

## 2017-12-06 NOTE — Telephone Encounter (Signed)
Spoke with patient and he would referral placed for stewart's therapy for back / leg strengthening and to help with gait.  Placed referral and informed pt of this.

## 2017-12-14 ENCOUNTER — Other Ambulatory Visit: Payer: Self-pay

## 2017-12-14 ENCOUNTER — Emergency Department: Payer: Medicare Other

## 2017-12-14 ENCOUNTER — Inpatient Hospital Stay
Admission: EM | Admit: 2017-12-14 | Discharge: 2017-12-27 | DRG: 853 | Disposition: A | Discharge status: Skilled Nursing Facility | Payer: Medicare Other | Attending: Internal Medicine | Admitting: Internal Medicine

## 2017-12-14 ENCOUNTER — Inpatient Hospital Stay: Payer: Medicare Other

## 2017-12-14 ENCOUNTER — Encounter: Payer: Self-pay | Admitting: Emergency Medicine

## 2017-12-14 DIAGNOSIS — Z7952 Long term (current) use of systemic steroids: Secondary | ICD-10-CM

## 2017-12-14 DIAGNOSIS — Z7401 Bed confinement status: Secondary | ICD-10-CM | POA: Diagnosis not present

## 2017-12-14 DIAGNOSIS — C801 Malignant (primary) neoplasm, unspecified: Secondary | ICD-10-CM | POA: Diagnosis present

## 2017-12-14 DIAGNOSIS — C799 Secondary malignant neoplasm of unspecified site: Secondary | ICD-10-CM

## 2017-12-14 DIAGNOSIS — L309 Dermatitis, unspecified: Secondary | ICD-10-CM | POA: Diagnosis not present

## 2017-12-14 DIAGNOSIS — E43 Unspecified severe protein-calorie malnutrition: Secondary | ICD-10-CM | POA: Diagnosis not present

## 2017-12-14 DIAGNOSIS — D630 Anemia in neoplastic disease: Secondary | ICD-10-CM | POA: Diagnosis present

## 2017-12-14 DIAGNOSIS — F17210 Nicotine dependence, cigarettes, uncomplicated: Secondary | ICD-10-CM | POA: Diagnosis present

## 2017-12-14 DIAGNOSIS — K6812 Psoas muscle abscess: Secondary | ICD-10-CM | POA: Diagnosis not present

## 2017-12-14 DIAGNOSIS — R5383 Other fatigue: Secondary | ICD-10-CM | POA: Diagnosis not present

## 2017-12-14 DIAGNOSIS — Z681 Body mass index (BMI) 19 or less, adult: Secondary | ICD-10-CM

## 2017-12-14 DIAGNOSIS — J440 Chronic obstructive pulmonary disease with acute lower respiratory infection: Secondary | ICD-10-CM | POA: Diagnosis present

## 2017-12-14 DIAGNOSIS — D72829 Elevated white blood cell count, unspecified: Secondary | ICD-10-CM | POA: Diagnosis not present

## 2017-12-14 DIAGNOSIS — C8400 Mycosis fungoides, unspecified site: Secondary | ICD-10-CM | POA: Diagnosis present

## 2017-12-14 DIAGNOSIS — G894 Chronic pain syndrome: Secondary | ICD-10-CM | POA: Diagnosis not present

## 2017-12-14 DIAGNOSIS — R627 Adult failure to thrive: Secondary | ICD-10-CM | POA: Diagnosis present

## 2017-12-14 DIAGNOSIS — M5440 Lumbago with sciatica, unspecified side: Secondary | ICD-10-CM | POA: Diagnosis not present

## 2017-12-14 DIAGNOSIS — R197 Diarrhea, unspecified: Secondary | ICD-10-CM | POA: Diagnosis not present

## 2017-12-14 DIAGNOSIS — R509 Fever, unspecified: Secondary | ICD-10-CM

## 2017-12-14 DIAGNOSIS — Y95 Nosocomial condition: Secondary | ICD-10-CM | POA: Diagnosis present

## 2017-12-14 DIAGNOSIS — M549 Dorsalgia, unspecified: Secondary | ICD-10-CM | POA: Diagnosis not present

## 2017-12-14 DIAGNOSIS — K567 Ileus, unspecified: Secondary | ICD-10-CM

## 2017-12-14 DIAGNOSIS — L8990 Pressure ulcer of unspecified site, unspecified stage: Secondary | ICD-10-CM

## 2017-12-14 DIAGNOSIS — R110 Nausea: Secondary | ICD-10-CM | POA: Diagnosis not present

## 2017-12-14 DIAGNOSIS — R590 Localized enlarged lymph nodes: Secondary | ICD-10-CM | POA: Diagnosis not present

## 2017-12-14 DIAGNOSIS — L989 Disorder of the skin and subcutaneous tissue, unspecified: Secondary | ICD-10-CM | POA: Diagnosis not present

## 2017-12-14 DIAGNOSIS — D509 Iron deficiency anemia, unspecified: Secondary | ICD-10-CM | POA: Diagnosis not present

## 2017-12-14 DIAGNOSIS — M47816 Spondylosis without myelopathy or radiculopathy, lumbar region: Secondary | ICD-10-CM | POA: Diagnosis not present

## 2017-12-14 DIAGNOSIS — M8448XA Pathological fracture, other site, initial encounter for fracture: Secondary | ICD-10-CM | POA: Diagnosis present

## 2017-12-14 DIAGNOSIS — D649 Anemia, unspecified: Secondary | ICD-10-CM | POA: Diagnosis not present

## 2017-12-14 DIAGNOSIS — Z8249 Family history of ischemic heart disease and other diseases of the circulatory system: Secondary | ICD-10-CM | POA: Diagnosis not present

## 2017-12-14 DIAGNOSIS — R159 Full incontinence of feces: Secondary | ICD-10-CM | POA: Diagnosis not present

## 2017-12-14 DIAGNOSIS — M48061 Spinal stenosis, lumbar region without neurogenic claudication: Secondary | ICD-10-CM | POA: Diagnosis not present

## 2017-12-14 DIAGNOSIS — R112 Nausea with vomiting, unspecified: Secondary | ICD-10-CM | POA: Diagnosis present

## 2017-12-14 DIAGNOSIS — R599 Enlarged lymph nodes, unspecified: Secondary | ICD-10-CM | POA: Diagnosis not present

## 2017-12-14 DIAGNOSIS — A419 Sepsis, unspecified organism: Secondary | ICD-10-CM

## 2017-12-14 DIAGNOSIS — F17290 Nicotine dependence, other tobacco product, uncomplicated: Secondary | ICD-10-CM | POA: Diagnosis present

## 2017-12-14 DIAGNOSIS — Z66 Do not resuscitate: Secondary | ICD-10-CM | POA: Diagnosis not present

## 2017-12-14 DIAGNOSIS — Z794 Long term (current) use of insulin: Secondary | ICD-10-CM | POA: Diagnosis not present

## 2017-12-14 DIAGNOSIS — L408 Other psoriasis: Secondary | ICD-10-CM | POA: Diagnosis not present

## 2017-12-14 DIAGNOSIS — R262 Difficulty in walking, not elsewhere classified: Secondary | ICD-10-CM | POA: Diagnosis not present

## 2017-12-14 DIAGNOSIS — R6510 Systemic inflammatory response syndrome (SIRS) of non-infectious origin without acute organ dysfunction: Secondary | ICD-10-CM | POA: Diagnosis not present

## 2017-12-14 DIAGNOSIS — Z515 Encounter for palliative care: Secondary | ICD-10-CM

## 2017-12-14 DIAGNOSIS — Z79899 Other long term (current) drug therapy: Secondary | ICD-10-CM | POA: Diagnosis not present

## 2017-12-14 DIAGNOSIS — L409 Psoriasis, unspecified: Secondary | ICD-10-CM | POA: Diagnosis present

## 2017-12-14 DIAGNOSIS — Z87821 Personal history of retained foreign body fully removed: Secondary | ICD-10-CM | POA: Diagnosis not present

## 2017-12-14 DIAGNOSIS — I10 Essential (primary) hypertension: Secondary | ICD-10-CM | POA: Diagnosis present

## 2017-12-14 DIAGNOSIS — Z791 Long term (current) use of non-steroidal anti-inflammatories (NSAID): Secondary | ICD-10-CM | POA: Diagnosis not present

## 2017-12-14 DIAGNOSIS — S32040A Wedge compression fracture of fourth lumbar vertebra, initial encounter for closed fracture: Secondary | ICD-10-CM | POA: Diagnosis not present

## 2017-12-14 DIAGNOSIS — E876 Hypokalemia: Secondary | ICD-10-CM | POA: Diagnosis not present

## 2017-12-14 DIAGNOSIS — F329 Major depressive disorder, single episode, unspecified: Secondary | ICD-10-CM | POA: Diagnosis present

## 2017-12-14 DIAGNOSIS — K5900 Constipation, unspecified: Secondary | ICD-10-CM | POA: Diagnosis present

## 2017-12-14 DIAGNOSIS — Z9889 Other specified postprocedural states: Secondary | ICD-10-CM

## 2017-12-14 DIAGNOSIS — R222 Localized swelling, mass and lump, trunk: Secondary | ICD-10-CM

## 2017-12-14 DIAGNOSIS — C7951 Secondary malignant neoplasm of bone: Secondary | ICD-10-CM | POA: Diagnosis present

## 2017-12-14 DIAGNOSIS — R634 Abnormal weight loss: Secondary | ICD-10-CM | POA: Diagnosis not present

## 2017-12-14 DIAGNOSIS — J189 Pneumonia, unspecified organism: Secondary | ICD-10-CM | POA: Diagnosis not present

## 2017-12-14 DIAGNOSIS — R531 Weakness: Secondary | ICD-10-CM | POA: Diagnosis not present

## 2017-12-14 DIAGNOSIS — E1151 Type 2 diabetes mellitus with diabetic peripheral angiopathy without gangrene: Secondary | ICD-10-CM | POA: Diagnosis present

## 2017-12-14 DIAGNOSIS — E119 Type 2 diabetes mellitus without complications: Secondary | ICD-10-CM | POA: Diagnosis not present

## 2017-12-14 DIAGNOSIS — M47814 Spondylosis without myelopathy or radiculopathy, thoracic region: Secondary | ICD-10-CM | POA: Diagnosis not present

## 2017-12-14 DIAGNOSIS — R64 Cachexia: Secondary | ICD-10-CM | POA: Diagnosis not present

## 2017-12-14 DIAGNOSIS — E11620 Type 2 diabetes mellitus with diabetic dermatitis: Secondary | ICD-10-CM | POA: Diagnosis not present

## 2017-12-14 DIAGNOSIS — J439 Emphysema, unspecified: Secondary | ICD-10-CM | POA: Diagnosis not present

## 2017-12-14 DIAGNOSIS — K8020 Calculus of gallbladder without cholecystitis without obstruction: Secondary | ICD-10-CM | POA: Diagnosis not present

## 2017-12-14 DIAGNOSIS — Z7189 Other specified counseling: Secondary | ICD-10-CM | POA: Diagnosis not present

## 2017-12-14 DIAGNOSIS — R0902 Hypoxemia: Secondary | ICD-10-CM | POA: Diagnosis present

## 2017-12-14 DIAGNOSIS — A4901 Methicillin susceptible Staphylococcus aureus infection, unspecified site: Secondary | ICD-10-CM | POA: Diagnosis present

## 2017-12-14 DIAGNOSIS — R42 Dizziness and giddiness: Secondary | ICD-10-CM | POA: Diagnosis not present

## 2017-12-14 DIAGNOSIS — M4856XA Collapsed vertebra, not elsewhere classified, lumbar region, initial encounter for fracture: Secondary | ICD-10-CM | POA: Diagnosis not present

## 2017-12-14 DIAGNOSIS — R918 Other nonspecific abnormal finding of lung field: Secondary | ICD-10-CM | POA: Diagnosis not present

## 2017-12-14 DIAGNOSIS — M62838 Other muscle spasm: Secondary | ICD-10-CM | POA: Diagnosis not present

## 2017-12-14 LAB — URINALYSIS, COMPLETE (UACMP) WITH MICROSCOPIC
BILIRUBIN URINE: NEGATIVE
Bacteria, UA: NONE SEEN
GLUCOSE, UA: NEGATIVE mg/dL
KETONES UR: 5 mg/dL — AB
Leukocytes, UA: NEGATIVE
Nitrite: NEGATIVE
PH: 7 (ref 5.0–8.0)
PROTEIN: NEGATIVE mg/dL
SQUAMOUS EPITHELIAL / LPF: NONE SEEN (ref 0–5)
Specific Gravity, Urine: 1.008 (ref 1.005–1.030)
WBC, UA: NONE SEEN WBC/hpf (ref 0–5)

## 2017-12-14 LAB — BLOOD GAS, VENOUS
ACID-BASE EXCESS: 2.5 mmol/L — AB (ref 0.0–2.0)
Bicarbonate: 26.2 mmol/L (ref 20.0–28.0)
O2 Saturation: 94.9 %
PH VEN: 7.47 — AB (ref 7.250–7.430)
Patient temperature: 37
pCO2, Ven: 36 mmHg — ABNORMAL LOW (ref 44.0–60.0)
pO2, Ven: 70 mmHg — ABNORMAL HIGH (ref 32.0–45.0)

## 2017-12-14 LAB — CBC WITH DIFFERENTIAL/PLATELET
BASOS PCT: 1 %
Basophils Absolute: 0.1 10*3/uL (ref 0–0.1)
EOS ABS: 0.2 10*3/uL (ref 0–0.7)
EOS PCT: 1 %
HCT: 30.4 % — ABNORMAL LOW (ref 40.0–52.0)
HEMOGLOBIN: 9.8 g/dL — AB (ref 13.0–18.0)
Lymphocytes Relative: 12 %
Lymphs Abs: 2.6 10*3/uL (ref 1.0–3.6)
MCH: 25.8 pg — ABNORMAL LOW (ref 26.0–34.0)
MCHC: 32.1 g/dL (ref 32.0–36.0)
MCV: 80.3 fL (ref 80.0–100.0)
MONOS PCT: 6 %
Monocytes Absolute: 1.3 10*3/uL — ABNORMAL HIGH (ref 0.2–1.0)
NEUTROS PCT: 82 %
Neutro Abs: 18.3 10*3/uL — ABNORMAL HIGH (ref 1.4–6.5)
PLATELETS: 374 10*3/uL (ref 150–440)
RBC: 3.78 MIL/uL — ABNORMAL LOW (ref 4.40–5.90)
RDW: 16.8 % — ABNORMAL HIGH (ref 11.5–14.5)
WBC: 22.5 10*3/uL — AB (ref 3.8–10.6)

## 2017-12-14 LAB — GLUCOSE, CAPILLARY
GLUCOSE-CAPILLARY: 91 mg/dL (ref 70–99)
GLUCOSE-CAPILLARY: 117 mg/dL — AB (ref 70–99)

## 2017-12-14 LAB — COMPREHENSIVE METABOLIC PANEL
ALK PHOS: 197 U/L — AB (ref 38–126)
ALT: 9 U/L (ref 0–44)
ANION GAP: 12 (ref 5–15)
AST: 23 U/L (ref 15–41)
Albumin: 2.7 g/dL — ABNORMAL LOW (ref 3.5–5.0)
BILIRUBIN TOTAL: 0.8 mg/dL (ref 0.3–1.2)
BUN: 11 mg/dL (ref 8–23)
CALCIUM: 8.2 mg/dL — AB (ref 8.9–10.3)
CO2: 27 mmol/L (ref 22–32)
Chloride: 100 mmol/L (ref 98–111)
Creatinine, Ser: 0.88 mg/dL (ref 0.61–1.24)
GFR calc non Af Amer: >60 mL/min (ref >60)
GLUCOSE: 88 mg/dL (ref 70–99)
Potassium: 3.1 mmol/L — ABNORMAL LOW (ref 3.5–5.1)
SODIUM: 139 mmol/L (ref 135–145)
TOTAL PROTEIN: 6.9 g/dL (ref 6.5–8.1)

## 2017-12-14 LAB — TROPONIN I: Troponin I: 0.03 ng/mL (ref <0.03)

## 2017-12-14 LAB — LACTIC ACID, PLASMA: LACTIC ACID, VENOUS: 1.3 mmol/L (ref 0.5–1.9)

## 2017-12-14 LAB — PSA: PROSTATIC SPECIFIC ANTIGEN: 3.65 ng/mL (ref 0.00–4.00)

## 2017-12-14 LAB — MAGNESIUM: Magnesium: 1.4 mg/dL — ABNORMAL LOW (ref 1.7–2.4)

## 2017-12-14 MED ORDER — DIPHENHYDRAMINE HCL 25 MG PO CAPS
25.0000 mg | ORAL_CAPSULE | Freq: Four times a day (QID) | ORAL | Status: DC | PRN
  Administered 2017-12-19 – 2017-12-25 (×4): 25 mg via ORAL
  Filled 2017-12-14 (×4): qty 1

## 2017-12-14 MED ORDER — PIPERACILLIN-TAZOBACTAM 3.375 G IVPB 30 MIN
3.3750 g | Freq: Once | INTRAVENOUS | Status: AC
  Administered 2017-12-14: 3.375 g via INTRAVENOUS
  Filled 2017-12-14: qty 50

## 2017-12-14 MED ORDER — ACETAMINOPHEN 325 MG PO TABS
650.0000 mg | ORAL_TABLET | Freq: Four times a day (QID) | ORAL | Status: DC | PRN
  Administered 2017-12-14 – 2017-12-25 (×11): 650 mg via ORAL
  Filled 2017-12-14 (×13): qty 2

## 2017-12-14 MED ORDER — INSULIN ASPART 100 UNIT/ML ~~LOC~~ SOLN
0.0000 [IU] | Freq: Three times a day (TID) | SUBCUTANEOUS | Status: DC
  Administered 2017-12-17: 1 [IU] via SUBCUTANEOUS
  Administered 2017-12-18: 3 [IU] via SUBCUTANEOUS
  Administered 2017-12-20 (×2): 2 [IU] via SUBCUTANEOUS
  Administered 2017-12-21: 1 [IU] via SUBCUTANEOUS
  Administered 2017-12-21 – 2017-12-22 (×2): 2 [IU] via SUBCUTANEOUS
  Administered 2017-12-23 (×2): 1 [IU] via SUBCUTANEOUS
  Administered 2017-12-24 (×2): 2 [IU] via SUBCUTANEOUS
  Administered 2017-12-25: 17:00:00 3 [IU] via SUBCUTANEOUS
  Administered 2017-12-25 – 2017-12-26 (×2): 1 [IU] via SUBCUTANEOUS
  Filled 2017-12-14 (×16): qty 1

## 2017-12-14 MED ORDER — ONDANSETRON HCL 4 MG PO TABS: 4.0000 mg | ORAL_TABLET | Freq: Four times a day (QID) | ORAL | Status: DC | PRN

## 2017-12-14 MED ORDER — ENOXAPARIN SODIUM 40 MG/0.4ML ~~LOC~~ SOLN
40.0000 mg | SUBCUTANEOUS | Status: DC
  Administered 2017-12-14: 40 mg via SUBCUTANEOUS
  Filled 2017-12-14: qty 0.4

## 2017-12-14 MED ORDER — VANCOMYCIN HCL IN DEXTROSE 1-5 GM/200ML-% IV SOLN
1000.0000 mg | Freq: Once | INTRAVENOUS | Status: AC
  Administered 2017-12-14: 1000 mg via INTRAVENOUS
  Filled 2017-12-14: qty 200

## 2017-12-14 MED ORDER — BACLOFEN 10 MG PO TABS
10.0000 mg | ORAL_TABLET | Freq: Three times a day (TID) | ORAL | Status: DC | PRN
  Administered 2017-12-20 – 2017-12-23 (×2): 10 mg via ORAL
  Filled 2017-12-14 (×3): qty 1

## 2017-12-14 MED ORDER — SODIUM CHLORIDE 0.9 % IV SOLN
Freq: Once | INTRAVENOUS | Status: AC
  Administered 2017-12-14: 11:00:00 via INTRAVENOUS

## 2017-12-14 MED ORDER — IOHEXOL 300 MG/ML  SOLN
75.0000 mL | Freq: Once | INTRAMUSCULAR | Status: AC | PRN
  Administered 2017-12-14: 75 mL via INTRAVENOUS

## 2017-12-14 MED ORDER — ACETAMINOPHEN 650 MG RE SUPP
650.0000 mg | Freq: Four times a day (QID) | RECTAL | Status: DC | PRN
  Administered 2017-12-16 – 2017-12-26 (×3): 650 mg via RECTAL
  Filled 2017-12-14 (×3): qty 1

## 2017-12-14 MED ORDER — KETOROLAC TROMETHAMINE 30 MG/ML IJ SOLN
15.0000 mg | Freq: Once | INTRAMUSCULAR | Status: AC
  Administered 2017-12-14: 15 mg via INTRAVENOUS
  Filled 2017-12-14: qty 1

## 2017-12-14 MED ORDER — PIPERACILLIN-TAZOBACTAM 3.375 G IVPB
3.3750 g | Freq: Three times a day (TID) | INTRAVENOUS | Status: DC
  Administered 2017-12-14 – 2017-12-15 (×3): 3.375 g via INTRAVENOUS
  Filled 2017-12-14 (×3): qty 50

## 2017-12-14 MED ORDER — ONDANSETRON HCL 4 MG/2ML IJ SOLN
4.0000 mg | Freq: Four times a day (QID) | INTRAMUSCULAR | Status: DC | PRN
  Administered 2017-12-22: 4 mg via INTRAVENOUS
  Filled 2017-12-14: qty 2

## 2017-12-14 MED ORDER — VANCOMYCIN HCL IN DEXTROSE 750-5 MG/150ML-% IV SOLN
750.0000 mg | INTRAVENOUS | Status: DC
  Administered 2017-12-15 – 2017-12-16 (×2): 750 mg via INTRAVENOUS
  Filled 2017-12-14 (×4): qty 150

## 2017-12-14 MED ORDER — TRIAMCINOLONE ACETONIDE 0.1 % EX OINT
2.0000 "application " | TOPICAL_OINTMENT | Freq: Two times a day (BID) | CUTANEOUS | Status: DC | PRN
  Filled 2017-12-14: qty 15

## 2017-12-14 MED ORDER — INSULIN ASPART 100 UNIT/ML ~~LOC~~ SOLN
0.0000 [IU] | Freq: Every day | SUBCUTANEOUS | Status: DC
  Administered 2017-12-20: 2 [IU] via SUBCUTANEOUS
  Filled 2017-12-14: qty 1

## 2017-12-14 NOTE — Progress Notes (Signed)
Pharmacy Antibiotic Note  Stephen [name]. is a 72 y.o. male admitted on 12/14/2017 with sepsis.  Pharmacy has been consulted for vancomycin and Zosyn dosing. PMH includes COPD, DM, psoriasis. He presented for diffuse back spasms, fever and was found to be hypoxic with generalized weakness and leukocytosis without obvious source other than possibly his bedsores. CXR did not reveal any acute process.   Plan: Vancomycin 750mg  IV every 24 hours starting 17 hours after first dose in the ED (stacked dose). K: 0.042, T1/2: 16.5h, Vd: 30L, calculated concentrations at steady-state: 45.5/17.3 mcg/mL  Goal trough 15-20 mcg/mL. Vt prior to the 4th dose Zosyn 3.375g IV q8h (4 hour infusion).  Height: 5\' 10"  (177.8 cm) Weight: 95 lb (43.1 kg) IBW/kg (Calculated) : 73  Temp (24hrs), Avg:99.7 F (37.6 C), Min:99.7 F (37.6 C), Max:99.7 F (37.6 C)  Recent Labs  Lab 12/14/17 1045  WBC 22.5*  CREATININE 0.88  LATICACIDVEN 1.3    Estimated Creatinine Clearance: 45.6 mL/min (by C-G formula based on SCr of 0.88 mg/dL).    No Known Allergies  Antimicrobials this admission: vancomycin 9/4 >>  Zosyn 9/4 >>   Microbiology results: 9/4 BCx: pending 9/4 UCx: pending   Thank you for allowing pharmacy to be a part of this patient's care.  Dallie Piles, PharmD 12/14/2017 1:38 PM

## 2017-12-14 NOTE — H&P (Signed)
[facility] NAME: Stephen [name]    MR#:  [id number]  DATE OF BIRTH:  [birth year]  DATE OF ADMISSION:  12/14/2017  PRIMARY CARE PHYSICIAN: Glean Hess, MD   REQUESTING/REFERRING PHYSICIAN: Dr. Lenise Arena  CHIEF COMPLAINT:   Chief Complaint  Patient presents with  . Weakness    HISTORY OF PRESENT ILLNESS:  Stephen [name]  is a 72 y.o. male with a known history of diabetes, psoriasis, depression, essential hypertension who presents to the hospital from home due to back pain, back spasms.  Patient himself is somewhat confused therefore most of the history obtained from the chart.  Attempted to call the patient's daughter but was not able to get in touch with anybody.  Patient is somewhat confused but says he presented to the ER due to significant back pain.  He denies any recent falls or trauma.  Patient presented to the ER was noted to have a low-grade fever of 100.5 and also noted to have leukocytosis.  He ruled in for Sirs/sepsis arteria but no acute source was identified.  Patient's urinalysis is negative, chest x-ray shows no acute pathology.  Given his leukocytosis and his fever hospitalist services were contacted for admission.  PAST MEDICAL HISTORY:   Past Medical History:  Diagnosis Date  . Diabetes mellitus without complication ([place])   . Major depressive disorder, single episode, unspecified 07/18/2017  . Psoriasis   . Skin abnormalities     PAST SURGICAL HISTORY:   Past Surgical History:  Procedure Laterality Date  . COLONOSCOPY  1995   one polyp  . ESOPHAGOGASTRODUODENOSCOPY N/A 05/02/2015   Procedure: ESOPHAGOGASTRODUODENOSCOPY (EGD);  Surgeon: Hulen Luster, MD;  Location: [facility] ENDOSCOPY;  Service: Endoscopy;  Laterality: N/A;    SOCIAL HISTORY:   Social History   Tobacco Use  . Smoking status: Current Every Day Smoker    Packs/day: 1.00    Years: 65.00    Pack years: 65.00    Types: Cigarettes,  Cigars  . Smokeless tobacco: Former Systems developer  . Tobacco comment: discussed options of quitting  Substance Use Topics  . Alcohol use: Not Currently    Alcohol/week: 0.0 standard drinks    FAMILY HISTORY:   Family History  Problem Relation Age of Onset  . Heart disease Mother   . Migraines Father   . Aneurysm Father   . Cancer Brother     DRUG ALLERGIES:  No Known Allergies  REVIEW OF SYSTEMS:   Review of Systems  Unable to perform ROS: Mental acuity    MEDICATIONS AT HOME:   Prior to Admission medications   Medication Sig Start Date End Date Taking? Authorizing Provider  metFORMIN (GLUCOPHAGE) 500 MG tablet Take 1 tablet (500 mg total) by mouth 4 (four) times daily. 10/18/17  Yes Glean Hess, MD  baclofen (LIORESAL) 10 MG tablet Take 1 tablet (10 mg total) by mouth 3 (three) times daily as needed for muscle spasms. 08/18/17   Glean Hess, MD  diphenhydrAMINE (BENADRYL) 25 mg capsule TAKE 1 CAPSULE (25 MG TOTAL) BY MOUTH EVERY 4 (FOUR) HOURS AS NEEDED FOR ITCHING. 04/13/17   Glean Hess, MD  glucose blood (ONE TOUCH ULTRA TEST) test strip TEST BY OTHER ROUTE DAILY AT 0600. 10/14/17   Glean Hess, MD  insulin aspart (NOVOLOG FLEXPEN) 100 UNIT/ML FlexPen Inject 4 Units into the skin daily as needed for high blood sugar (for BS > 200). 07/21/16   Glean Hess,  MD  Insulin Pen Needle (PEN NEEDLES) 31G X 5 MM MISC 1 each by Does not apply route daily. 07/21/16   Glean Hess, MD  naproxen sodium (ALEVE) 220 MG tablet Take 440 mg by mouth daily as needed.    [provider]  triamcinolone ointment (KENALOG) 0.1 % Apply 2 application topically 2 (two) times daily. 09/27/17 09/27/18  Glean Hess, MD      VITAL SIGNS:  Blood pressure (!) 161/91, pulse 92, temperature 99.7 F (37.6 C), temperature source Oral, resp. rate 14, height 5\' 10"  (1.778 m), weight 43.1 kg, SpO2 98 %.  PHYSICAL EXAMINATION:  Physical Exam  GENERAL:  72 y.o.-year-old  patient lying in the bed confused but in NAD.   EYES: Pupils equal, round, reactive to light and accommodation. No scleral icterus. Extraocular muscles intact.  HEENT: Head atraumatic, normocephalic. Oropharynx and nasopharynx clear. No oropharyngeal erythema, dry oral mucosa  NECK:  Supple, no jugular venous distention. No thyroid enlargement, no tenderness.  LUNGS: Normal breath sounds bilaterally, no wheezing, rales, rhonchi. No use of accessory muscles of respiration.  CARDIOVASCULAR: S1, S2 RRR. No murmurs, rubs, gallops, clicks.  ABDOMEN: Soft, nontender, nondistended. Bowel sounds present. No organomegaly or mass.  EXTREMITIES: No pedal edema, cyanosis, or clubbing. + 2 pedal & radial pulses b/l.   NEUROLOGIC: Cranial nerves II through XII are intact. No focal Motor or sensory deficits appreciated b/l. Globally weak.  PSYCHIATRIC: The patient is alert and oriented x 1.  SKIN: No obvious rash, lesion, or ulcer.   LABORATORY PANEL:   CBC Recent Labs  Lab 12/14/17 1045  WBC 22.5*  HGB 9.8*  HCT 30.4*  PLT 374   ------------------------------------------------------------------------------------------------------------------  Chemistries  Recent Labs  Lab 12/14/17 1045  NA 139  K 3.1*  CL 100  CO2 27  GLUCOSE 88  BUN 11  CREATININE 0.88  CALCIUM 8.2*  AST 23  ALT 9  ALKPHOS 197*  BILITOT 0.8   ------------------------------------------------------------------------------------------------------------------  Cardiac Enzymes Recent Labs  Lab 12/14/17 1045  TROPONINI 0.03*   ------------------------------------------------------------------------------------------------------------------  RADIOLOGY:  Dg Chest Port 1 View  Result Date: 12/14/2017 CLINICAL DATA:  Fever EXAM: PORTABLE CHEST 1 VIEW COMPARISON:  None. FINDINGS: The cardiac silhouette is normal in size and configuration. No mediastinal or hilar masses. There is no evidence of adenopathy. There are  thickened bronchovascular markings bilaterally, which are most likely chronic. No evidence of pneumonia or pulmonary edema. No pleural effusion or pneumothorax. Skeletal structures are demineralized but grossly intact. IMPRESSION: No acute cardiopulmonary disease. Electronically Signed   By: Lajean Manes M.D.   On: 12/14/2017 12:14     IMPRESSION AND PLAN:   72 year old male with past medical history of COPD, diabetes, psoriasis who presents to the hospital from home due to back pain, weakness.  1.  Sepsis/Sirs-patient meets criteria given his leukocytosis, low-grade fever.  Source of sepsis/Sirs is unclear. -Patient's urinalysis is negative, chest x-ray shows no acute pathology.  Patient has no rash or skin lesions consistent with any evidence of infection.  -We will treat patient empirically with broad-spectrum antibiotics with IV vancomycin, Zosyn.  Follow cultures.  Patient complaining of some back pain and therefore I will get a x-ray of his thoracic and lumbar spine to rule out any infectious pathology in his back.  2.  Leukocytosis-suspected to be secondary to the underlying sepsis/Sirs. -We will follow with IV antibiotic therapy.  3.  Generalized weakness-we will get physical therapy consult to assess mobility.  4.  Diabetes- hold patient's schedule insulin, metformin.  We will place the patient on sliding scale insulin for now.  5.  Anemia of chronic disease-hemoglobin stable we will continue to monitor.  No acute need for transfusion.    All the records are reviewed and case discussed with ED provider. Management plans discussed with the patient, family and they are in agreement.  CODE STATUS: Full code  TOTAL TIME TAKING CARE OF THIS PATIENT: 45 minutes.    Henreitta Leber M.D on 12/14/2017 at 1:44 PM  Between 7am to 6pm - Pager - [phone]  After 6pm go to [email or web address] - password [email or web address]  [facility]  Office  [phone]  CC: Primary care  physician; Glean Hess, MD

## 2017-12-14 NOTE — ED Triage Notes (Signed)
Pt arrived via ems from home with concerns over increased weakness and muscle spasms over the last few days.

## 2017-12-14 NOTE — Progress Notes (Addendum)
Patient complains of back pain on admission.  X-ray of the lumbar spine is suggestive of possible metastatic process.  I will get a MRI of the lumbar spine.  Order PSA.

## 2017-12-14 NOTE — ED Provider Notes (Addendum)
[facility] Emergency Department Provider Note       Time seen: ----------------------------------------- 10:37 AM on 12/14/2017 -----------------------------------------   I have reviewed the triage vital signs and the nursing notes.  HISTORY   Chief Complaint No chief complaint on file.    HPI Stephen Weedman. is a 72 y.o. male with a history of diabetes, psoriasis, major depression, peripheral vascular disease who presents to the ED for increased weakness and muscle spasms for the past few days.  He was found to be febrile in route by EMS.  He was also found to be hypoxic but does not have a history of this.  In room air saturations were 86% on room air.  He is complaining of diffuse pain from spasms he states.  Past Medical History:  Diagnosis Date  . Diabetes mellitus without complication ([place])   . Major depressive disorder, single episode, unspecified 07/18/2017  . Psoriasis   . Skin abnormalities     Patient Active Problem List   Diagnosis Date Noted  . Situational insomnia 07/18/2017  . Erythematous condition 07/18/2017  . Hypomagnesemia 07/08/2016  . 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 PVD (peripheral vascular disease) ([place]) 11/17/2015  . Hx of falling 11/17/2015  . Protein-calorie malnutrition, severe 11/09/2015  . Alcoholism in remission ([place]) 09/12/2015  . Duodenitis 05/06/2015  . Hyponatremia 12/15/2014  . Thrombocytopenia ([place]) 12/15/2014  . Malnutrition ([place]) 12/15/2014  . Tobacco abuse 12/15/2014  . Cholelithiasis 12/15/2014  . Diabetes mellitus type 2, uncontrolled ([place]) 09/18/2014  . Psoriasis 09/16/2014  . Excessive weight loss 09/16/2014    Past Surgical History:  Procedure Laterality Date  . COLONOSCOPY  1995   one polyp  . ESOPHAGOGASTRODUODENOSCOPY N/A 05/02/2015   Procedure: ESOPHAGOGASTRODUODENOSCOPY (EGD);  Surgeon: Hulen Luster, MD;  Location: [facility] ENDOSCOPY;  Service: Endoscopy;  Laterality: N/A;    Allergies Patient  has no known allergies.  Social History Social History   Tobacco Use  . Smoking status: Current Every Day Smoker    Packs/day: 0.50    Years: 65.00    Pack years: 32.50    Types: Cigarettes, Cigars  . Smokeless tobacco: Former [employer]  . Tobacco comment: discussed options of quitting  Substance Use Topics  . Alcohol use: Not Currently    Alcohol/week: 0.0 standard drinks  . Drug use: Not Currently   Review of Systems Constitutional: Negative for fever. Cardiovascular: Negative for chest pain. Respiratory: Negative for shortness of breath. Gastrointestinal: Negative for abdominal pain, vomiting and diarrhea. Musculoskeletal: Positive for diffuse pain and spasm Skin: Negative for rash. Neurological: Negative for headaches, focal weakness or numbness.  All systems negative/normal/unremarkable except as stated in the HPI  ____________________________________________   PHYSICAL EXAM:  VITAL SIGNS: ED Triage Vitals  Enc Vitals Group     BP      Pulse      Resp      Temp      Temp src      SpO2      Weight      Height      Head Circumference      Peak Flow      Pain Score      Pain Loc      Pain Edu?      Excl. in [place]?    Constitutional: Alert and oriented.  Chronically ill-appearing, mild distress.  Cachexia Eyes: Conjunctivae are normal. Normal extraocular movements. ENT   Head: Normocephalic and atraumatic.   Nose: No congestion/rhinnorhea.   Mouth/Throat:  Mucous membranes are moist.   Neck: No stridor. Cardiovascular: Rapid rate, regular rhythm. No murmurs, rubs, or gallops. Respiratory: Mild tachypnea with scattered rhonchi Gastrointestinal: Soft and nontender. Normal bowel sounds Musculoskeletal: Nontender with normal range of motion in extremities. No lower extremity tenderness nor edema. Neurologic:  Normal speech and language. No gross focal neurologic deficits are appreciated.  Skin: Bedsores are noted Psychiatric: Depressed mood and  affect ____________________________________________  EKG: Interpreted by me.  Sinus rhythm the rate of 97 bpm, normal PR interval, possible septal infarct age-indeterminate, normal axis, normal QT.  ____________________________________________  ED COURSE:  As part of my medical decision making, I reviewed the following data within the [place] History obtained from family if available, nursing notes, old chart and ekg, as well as notes from prior ED visits. Patient presented for weakness with spasms and hypoxia, we will assess with labs and imaging as indicated at this time.   Procedures ____________________________________________   LABS (pertinent positives/negatives)  Labs Reviewed  COMPREHENSIVE METABOLIC PANEL - Abnormal; Notable for the following components:      Result Value   Potassium 3.1 (*)    Calcium 8.2 (*)    Albumin 2.7 (*)    Alkaline Phosphatase 197 (*)    All other components within normal limits  TROPONIN I - Abnormal; Notable for the following components:   Troponin I 0.03 (*)    All other components within normal limits  CBC WITH DIFFERENTIAL/PLATELET - Abnormal; Notable for the following components:   WBC 22.5 (*)    RBC 3.78 (*)    Hemoglobin 9.8 (*)    HCT 30.4 (*)    MCH 25.8 (*)    RDW 16.8 (*)    Neutro Abs 18.3 (*)    Monocytes Absolute 1.3 (*)    All other components within normal limits  BLOOD GAS, VENOUS - Abnormal; Notable for the following components:   pH, Ven 7.47 (*)    pCO2, Ven 36 (*)    pO2, Ven 70.0 (*)    Acid-Base Excess 2.5 (*)    All other components within normal limits  URINALYSIS, COMPLETE (UACMP) WITH MICROSCOPIC - Abnormal; Notable for the following components:   Color, Urine STRAW (*)    APPearance CLEAR (*)    Hgb urine dipstick MODERATE (*)    Ketones, ur 5 (*)    All other components within normal limits  CULTURE, BLOOD (ROUTINE X 2)  CULTURE, BLOOD (ROUTINE X 2)  URINE CULTURE  LACTIC ACID,  PLASMA  MAGNESIUM   CRITICAL CARE Performed by: Laurence Aly   Total critical care time: 30 minutes  Critical care time was exclusive of separately billable procedures and treating other patients.  Critical care was necessary to treat or prevent imminent or life-threatening deterioration.  Critical care was time spent personally by me on the following activities: development of treatment plan with patient and/or surrogate as well as nursing, discussions with consultants, evaluation of patient's response to treatment, examination of patient, obtaining history from patient or surrogate, ordering and performing treatments and interventions, ordering and review of laboratory studies, ordering and review of radiographic studies, pulse oximetry and re-evaluation of patient's condition.  RADIOLOGY Images were viewed by me  Chest x-ray IMPRESSION: No acute cardiopulmonary disease.  ____________________________________________  DIFFERENTIAL DIAGNOSIS   Sepsis, UTI, pneumonia, dehydration, electrolyte abnormality, medication side effect  FINAL ASSESSMENT AND PLAN  Weakness, hypoxia, likely sepsis   Plan: The patient had presented for diffuse spasms and was  found to be hypoxic with generalized weakness. Patient's labs revealed marketed leukocytosis without obvious source other than possibly his bedsores. Patient's imaging did not reveal any acute process.  Family states he was told he may have an abdominal mass so he may need a CT at some point but I am unsure of what significance this will have.  We have started broad-spectrum antibiotics as well as fluids and I will discuss with the hospitalist for admission.   Laurence Aly, MD   Note: This note was generated in part or whole with voice recognition software. Voice recognition is usually quite accurate but there are transcription errors that can and very often do occur. I apologize for any typographical errors that  were not detected and corrected.     Earleen Newport, MD 12/14/17 1301    Earleen Newport, MD 12/14/17 1310    Earleen Newport, MD 12/14/17 1311

## 2017-12-14 NOTE — Progress Notes (Addendum)
Pt admitted to room 145 via stretcher from ER without incident per MD order. Prior to transfer, report received from [facility] in ED. Pt's family had left prior to admit to the floor. Pt transferred from stretcher to bed. Pt skin assessed. Pt noted to have a small slit like opening of his right lateral heel. Heel protector foam applied. Sacral dressing applied. Pt has h/o psorasis. Telemetry set up per MD order. Pt alert and oriented x 3 but noted to be forgetful. Pt oriented to unit and unit routines. Pt oriented to fall precautons such as bed alarms. Pt instructed how to call RN /NT and how to call and order meals. Will continue to monitor.

## 2017-12-15 ENCOUNTER — Inpatient Hospital Stay: Payer: Medicare Other

## 2017-12-15 DIAGNOSIS — R5383 Other fatigue: Secondary | ICD-10-CM

## 2017-12-15 DIAGNOSIS — Z7984 Long term (current) use of oral hypoglycemic drugs: Secondary | ICD-10-CM

## 2017-12-15 DIAGNOSIS — M48061 Spinal stenosis, lumbar region without neurogenic claudication: Secondary | ICD-10-CM

## 2017-12-15 DIAGNOSIS — R110 Nausea: Secondary | ICD-10-CM

## 2017-12-15 DIAGNOSIS — D649 Anemia, unspecified: Secondary | ICD-10-CM

## 2017-12-15 DIAGNOSIS — F1021 Alcohol dependence, in remission: Secondary | ICD-10-CM

## 2017-12-15 DIAGNOSIS — F17210 Nicotine dependence, cigarettes, uncomplicated: Secondary | ICD-10-CM

## 2017-12-15 DIAGNOSIS — D72829 Elevated white blood cell count, unspecified: Secondary | ICD-10-CM

## 2017-12-15 DIAGNOSIS — R627 Adult failure to thrive: Secondary | ICD-10-CM

## 2017-12-15 DIAGNOSIS — R634 Abnormal weight loss: Secondary | ICD-10-CM

## 2017-12-15 DIAGNOSIS — M4856XA Collapsed vertebra, not elsewhere classified, lumbar region, initial encounter for fracture: Secondary | ICD-10-CM

## 2017-12-15 DIAGNOSIS — R197 Diarrhea, unspecified: Secondary | ICD-10-CM

## 2017-12-15 DIAGNOSIS — R42 Dizziness and giddiness: Secondary | ICD-10-CM

## 2017-12-15 DIAGNOSIS — K6812 Psoas muscle abscess: Secondary | ICD-10-CM

## 2017-12-15 DIAGNOSIS — E11620 Type 2 diabetes mellitus with diabetic dermatitis: Secondary | ICD-10-CM

## 2017-12-15 DIAGNOSIS — R159 Full incontinence of feces: Secondary | ICD-10-CM

## 2017-12-15 DIAGNOSIS — F329 Major depressive disorder, single episode, unspecified: Secondary | ICD-10-CM

## 2017-12-15 DIAGNOSIS — R531 Weakness: Secondary | ICD-10-CM

## 2017-12-15 LAB — URINE CULTURE: Culture: <10000 — AB

## 2017-12-15 LAB — POTASSIUM: Potassium: 4.1 mmol/L (ref 3.5–5.1)

## 2017-12-15 LAB — GLUCOSE, CAPILLARY
GLUCOSE-CAPILLARY: 176 mg/dL — AB (ref 70–99)
GLUCOSE-CAPILLARY: 140 mg/dL — AB (ref 70–99)
Glucose-Capillary: 163 mg/dL — ABNORMAL HIGH (ref 70–99)
Glucose-Capillary: 177 mg/dL — ABNORMAL HIGH (ref 70–99)

## 2017-12-15 LAB — FOLATE: Folate: 6.2 ng/mL (ref >5.9)

## 2017-12-15 LAB — IRON AND TIBC
IRON: 16 ug/dL — AB (ref 45–182)
Saturation Ratios: 9 % — ABNORMAL LOW (ref 17.9–39.5)
TIBC: 187 ug/dL — ABNORMAL LOW (ref 250–450)
UIBC: 171 ug/dL

## 2017-12-15 LAB — BASIC METABOLIC PANEL
ANION GAP: 10 (ref 5–15)
BUN: 13 mg/dL (ref 8–23)
CHLORIDE: 100 mmol/L (ref 98–111)
CO2: 27 mmol/L (ref 22–32)
Calcium: 7.6 mg/dL — ABNORMAL LOW (ref 8.9–10.3)
Creatinine, Ser: 0.75 mg/dL (ref 0.61–1.24)
GFR calc non Af Amer: >60 mL/min (ref >60)
GLUCOSE: 70 mg/dL (ref 70–99)
Potassium: 2.7 mmol/L — CL (ref 3.5–5.1)
Sodium: 137 mmol/L (ref 135–145)

## 2017-12-15 LAB — VITAMIN B12: VITAMIN B 12: 841 pg/mL (ref 180–914)

## 2017-12-15 LAB — RETICULOCYTES
RBC.: 3.32 MIL/uL — AB (ref 4.40–5.90)
RETIC COUNT ABSOLUTE: 56.4 10*3/uL (ref 19.0–183.0)
Retic Ct Pct: 1.7 % (ref 0.4–3.1)

## 2017-12-15 LAB — CBC
HEMATOCRIT: 25.7 % — AB (ref 40.0–52.0)
Hemoglobin: 8.5 g/dL — ABNORMAL LOW (ref 13.0–18.0)
MCH: 26.4 pg (ref 26.0–34.0)
MCHC: 32.9 g/dL (ref 32.0–36.0)
MCV: 80.3 fL (ref 80.0–100.0)
Platelets: 314 10*3/uL (ref 150–440)
RBC: 3.2 MIL/uL — AB (ref 4.40–5.90)
RDW: 16.9 % — ABNORMAL HIGH (ref 11.5–14.5)
WBC: 16.2 10*3/uL — ABNORMAL HIGH (ref 3.8–10.6)

## 2017-12-15 LAB — FERRITIN: Ferritin: 122 ng/mL (ref 24–336)

## 2017-12-15 LAB — MRSA PCR SCREENING: MRSA by PCR: NEGATIVE

## 2017-12-15 MED ORDER — OXYCODONE-ACETAMINOPHEN 5-325 MG PO TABS
1.0000 | ORAL_TABLET | Freq: Four times a day (QID) | ORAL | Status: DC | PRN
  Administered 2017-12-15 – 2017-12-18 (×8): 1 via ORAL
  Filled 2017-12-15 (×9): qty 1

## 2017-12-15 MED ORDER — NEPRO/CARBSTEADY PO LIQD
237.0000 mL | Freq: Two times a day (BID) | ORAL | Status: DC
  Administered 2017-12-15 – 2017-12-21 (×8): 237 mL via ORAL

## 2017-12-15 MED ORDER — POTASSIUM CHLORIDE 20 MEQ PO PACK
40.0000 meq | PACK | ORAL | Status: AC
  Administered 2017-12-15 (×2): 40 meq via ORAL
  Filled 2017-12-15 (×2): qty 2

## 2017-12-15 MED ORDER — ENOXAPARIN SODIUM 30 MG/0.3ML ~~LOC~~ SOLN
30.0000 mg | SUBCUTANEOUS | Status: DC
  Administered 2017-12-16: 30 mg via SUBCUTANEOUS
  Filled 2017-12-15 (×2): qty 0.3

## 2017-12-15 MED ORDER — GADOBENATE DIMEGLUMINE 529 MG/ML IV SOLN
10.0000 mL | Freq: Once | INTRAVENOUS | Status: AC | PRN
  Administered 2017-12-15: 9 mL via INTRAVENOUS

## 2017-12-15 MED ORDER — ADULT MULTIVITAMIN W/MINERALS CH
1.0000 | ORAL_TABLET | Freq: Every day | ORAL | Status: DC
  Administered 2017-12-18 – 2017-12-27 (×10): 1 via ORAL
  Filled 2017-12-15 (×10): qty 1

## 2017-12-15 MED ORDER — VITAMIN C 500 MG PO TABS
250.0000 mg | ORAL_TABLET | Freq: Two times a day (BID) | ORAL | Status: DC
  Administered 2017-12-16 – 2017-12-27 (×16): 250 mg via ORAL
  Filled 2017-12-15 (×25): qty 0.5

## 2017-12-15 MED ORDER — MAGNESIUM SULFATE 4 GM/100ML IV SOLN
4.0000 g | Freq: Once | INTRAVENOUS | Status: AC
  Administered 2017-12-15: 4 g via INTRAVENOUS
  Filled 2017-12-15: qty 100

## 2017-12-15 NOTE — Consult Note (Signed)
Date of Admission:  12/14/2017                 Reason for Consult: psoas abscess   Referring Provider: Mody History from patient  HPI: Stephen [name]. is a 72 y.o. male with h/o DM dermatitis  Is brought in by EMS \\with  generalized weakness, . As per patient his neighbors called the paramedic because he was very weak and staying in bed. He lives on his won. Has had back pain for months and takes advil OTC 2 tablets every 6 hrs. He has lost 50 pounds of weight in an undefined period. Has had a skin condition for the past few years and has [facility] a dermat[facility] in the past and was being treated as eczema VS psoriasis. 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 He says vodka was his drink and he was an alcoholic. He has had baseline cough, poor appetite  Past Medical History:  Diagnosis Date  . Diabetes mellitus without complication ([place])   . Major depressive disorder, single episode, unspecified 07/18/2017  . Psoriasis   . Skin abnormalities     Past Surgical History:  Procedure Laterality Date  . COLONOSCOPY  1995   one polyp  . ESOPHAGOGASTRODUODENOSCOPY N/A 05/02/2015   Procedure: ESOPHAGOGASTRODUODENOSCOPY (EGD);  Surgeon: 05/15/2015, MD;  Location: [facility] ENDOSCOPY;  Service: Endoscopy;  Laterality: N/A;    Social History   Tobacco Use  . Smoking status: Current Every Day Smoker    Packs/day: 1.00    Years: 65.00    Pack years: 65.00    Types: Cigarettes, Cigars  . Smokeless tobacco: Former [facility]  . Tobacco comment: discussed options of quitting  Substance Use Topics  . Alcohol use: Not Currently    Alcohol/week: 0.0 standard drinks  . Drug use: Not Currently    Family History  Problem Relation Age of Onset  . Heart disease Mother   . Migraines Father   . Aneurysm Father   . Cancer Brother      . enoxaparin (LOVENOX) injection  30 mg Subcutaneous Q24H  . feeding supplement (NEPRO CARB STEADY)  237 mL Oral BID BM  . insulin  aspart  0-5 Units Subcutaneous QHS  . insulin aspart  0-9 Units Subcutaneous TID WC  . multivitamin with minerals  1 tablet Oral Daily  . vitamin C  250 mg Oral BID      Abtx:  Anti-infectives (From admission, onward)   Start     Dose/Rate Route Frequency Ordered Stop   12/15/17 0200  vancomycin (VANCOCIN) IVPB 750 mg/150 ml premix     750 mg 150 mL/hr over 60 Minutes Intravenous Every 24 hours 12/14/17 1337     12/14/17 2200  piperacillin-tazobactam (ZOSYN) IVPB 3.375 g     3.375 g 12.5 mL/hr over 240 Minutes Intravenous Every 8 hours 12/14/17 1337     12/14/17 1130  vancomycin (VANCOCIN) IVPB 1000 mg/200 mL premix     1,000 mg 200 mL/hr over 60 Minutes Intravenous  Once 12/14/17 1122 12/14/17 1425   12/14/17 1130  piperacillin-tazobactam (ZOSYN) IVPB 3.375 g     3.375 g 100 mL/hr over 30 Minutes Intravenous  Once 12/14/17 1122 12/14/17 1259       Review of Systems: Review of Systems  Constitutional: Positive for malaise/fatigue and weight loss. Negative for chills and fever.  HENT: Negative for congestion, hearing loss and sore throat.   Eyes: Negative for  blurred vision.  Respiratory: Positive for cough and sputum production.        No hemoptysis  Cardiovascular: Negative for chest pain.  Gastrointestinal: Positive for diarrhea (incontinence of stool) and nausea. Negative for abdominal pain and vomiting.  Genitourinary: Negative for dysuria and hematuria.  Musculoskeletal: Positive for back pain (all over the back) and myalgias.  Skin: Positive for rash (very dry skin over his palms and soles- splits easily).  Neurological: Positive for dizziness and weakness.  Endo/Heme/Allergies: Bruises/bleeds easily.  Psychiatric/Behavioral: Positive for depression.      No Known Allergies  OBJECTIVE: Blood pressure 131/76, pulse 69, temperature 98.1 F (36.7 C), temperature source Oral, resp. rate 13, height 5\' 9"  (1.753 m), weight 40.8 kg, SpO2 97 %.  Physical Exam   Constitutional: He is oriented to person, place, and time.  Emaciated, frail, pale, fatigued  HENT:  Head: Normocephalic.  Eyes: Pupils are equal, round, and reactive to light.  Neck: Normal range of motion. Neck supple.  Cardiovascular:  s1s2 No murmur  Pulmonary/Chest:  B/l air entry decreased bases  Abdominal: Soft. There is no tenderness.  Musculoskeletal: Normal range of motion.  Lymphadenopathy:    He has no cervical adenopathy.  Neurological: He is alert and oriented to person, place, and time.  Grossly non focal-not examined in detail  Skin:  Very dry skin over the palms with icthyosis and splitting of skin Same with dorsum of feet Skin over the nape of the neck thick and nodular          3 cm palpable, firm irregular lymphnodes in the rt axilla       Left no lN B/l inguinal nodes palpable Lab Results CBC    Component Value Date/Time   WBC 16.2 (H) 12/15/2017 0425   RBC 3.20 (L) 12/15/2017 0425   RBC 3.32 (L) 12/15/2017 0425   HGB 8.5 (L) 12/15/2017 0425   HGB 16.4 09/16/2014 1034   HCT 25.7 (L) 12/15/2017 0425   HCT 50.0 09/16/2014 1034   PLT 314 12/15/2017 0425   PLT 172 09/16/2014 1034   MCV 80.3 12/15/2017 0425   MCV 95 09/16/2014 1034   MCH 26.4 12/15/2017 0425   MCHC 32.9 12/15/2017 0425   RDW 16.9 (H) 12/15/2017 0425   RDW 13.6 09/16/2014 1034   LYMPHSABS 2.6 12/14/2017 1045   LYMPHSABS 1.6 09/16/2014 1034   MONOABS 1.3 (H) 12/14/2017 1045   EOSABS 0.2 12/14/2017 1045   EOSABS 0.1 09/16/2014 1034   BASOSABS 0.1 12/14/2017 1045   BASOSABS 0.1 09/16/2014 1034    CMP Latest Ref Rng & Units 12/15/2017 12/14/2017 11/21/2017  Glucose 70 - 99 mg/dL 70 88 59(L)  BUN 8 - 23 mg/dL 13 11 12   Creatinine 0.61 - 1.24 mg/dL 0.75 0.88 0.83  Sodium 135 - 145 mmol/L 137 139 140  Potassium 3.5 - 5.1 mmol/L 2.7(LL) 3.1(L) 4.0  Chloride 98 - 111 mmol/L 100 100 104  CO2 22 - 32 mmol/L 27 27 16(L)  Calcium 8.9 - 10.3 mg/dL 7.6(L) 8.2(L) 8.7  Total  Protein 6.5 - 8.1 g/dL - 6.9 -  Total Bilirubin 0.3 - 1.2 mg/dL - 0.8 -  Alkaline Phos 38 - 126 U/L - 197(H) -  AST 15 - 41 U/L - 23 -  ALT 0 - 44 U/L - 9 -      Microbiology: Recent Results (from the past 240 hour(s))  Blood Culture (routine x 2)     Status: None (Preliminary result)   Collection Time:  12/14/17 10:45 AM  Result Value Ref Range Status   Specimen Description BLOOD BLOOD LEFT ARM  Final   Special Requests   Final    BOTTLES DRAWN AEROBIC AND ANAEROBIC Blood Culture adequate volume   Culture   Final    NO GROWTH < 24 HOURS Performed at [facility], [address] [place], [place] [address]    Report Status PENDING  Incomplete  Blood Culture (routine x 2)     Status: None (Preliminary result)   Collection Time: 12/14/17 10:45 AM  Result Value Ref Range Status   Specimen Description BLOOD LEFT ANTECUBITAL  Final   Special Requests   Final    BOTTLES DRAWN AEROBIC AND ANAEROBIC Blood Culture adequate volume   Culture   Final    NO GROWTH < 24 HOURS Performed at [facility], [address], [place], [place] [address]    Report Status PENDING  Incomplete  Urine culture     Status: Abnormal   Collection Time: 12/14/17 10:45 AM  Result Value Ref Range Status   Specimen Description   Final    URINE, RANDOM Performed at [facility], [address] [place], [place] [address]    Special Requests   Final    NONE Performed at [facility], [address], [place], [place] [address]    Culture (A)  Final    <10,000 COLONIES/mL INSIGNIFICANT GROWTH Performed at [facility], [place] [address] [place], [place] [address]    Report Status 12/15/2017 FINAL  Final  MRSA PCR Screening     Status: None   Collection Time: 12/15/17  9:04 AM  Result Value Ref Range Status   MRSA by PCR NEGATIVE NEGATIVE Final    Comment:        The GeneXpert MRSA Assay (FDA approved for NASAL specimens only), is one component of  a comprehensive MRSA colonization surveillance program. It is not intended to diagnose MRSA infection nor to guide or monitor treatment for MRSA infections. 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 Left periaortic lymph node enlargement. Numerous additional lesions inthe lumbar spine. Findings are consistent with metastatic disease. Favor prostate cancer given sclerotic appearance on x-ray. Recommend correlation with PSA and rectal exam.  Mass lesion extends into the left psoas muscle. There is central nonenhancing necrosis in the left psoas muscle most likely due to tumor. Psoas abscess could have a similar appearance.  Advanced multilevel degenerative change throughout the lumbar spine as above.  Radiographs and labs were personally reviewed by me.   Assessment and Plan  72 y.o. male with h/o DM dermatitis  Is brought in by EMS \\with  generalized weakness, .  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 This is malignancy as reactive lymphnodes are not this enlarged and usually tender  Lymphoma is a concern. 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  Failure to thrive Will check HIV( he agreed)  Dermatitis- D.D mycosis fungoides Atopic eczema Paraneoplastic dermatosis [place] rash is less likely as it is not typical for it Zinc deficiency with NME is a remote possibility but not typical  The last two in the differential because of malnutrition and alcoholism ( past)  Can be secondarily infected - continue vanco - if skin culture neg- will DC it  Anemia- etiology multifactorial  DM- was taking metformin  P.S reviewed [facility] records (he has had a skin biopsy in the past )  Needs diagnostic biopsy from the vertebral lesion or the rt axillary lymphnode  Discussed the management with the patient and his nurse   Tsosie Billing, MD  12/15/2017, 4:22 PM

## 2017-12-15 NOTE — Progress Notes (Signed)
[facility] [place] NAME: Stephen [name]    MR#:  [id number]  DATE OF BIRTH:  05-12-1945  SUBJECTIVE:  Patient without issues overnight No complaints this am  REVIEW OF SYSTEMS:    Review of Systems  Constitutional: Negative for fever, chills weight loss HENT: Negative for ear pain, nosebleeds, congestion, facial swelling, rhinorrhea, neck pain, neck stiffness and ear discharge.   Respiratory: Negative for cough, shortness of breath, wheezing  Cardiovascular: Negative for chest pain, palpitations and leg swelling.  Gastrointestinal: Negative for heartburn, abdominal pain, vomiting, diarrhea or consitpation Genitourinary: Negative for dysuria, urgency, frequency, hematuria Musculoskeletal: Negative for back pain or joint pain Neurological: Negative for dizziness, seizures, syncope, focal weakness,  numbness and headaches.  Hematological: Does not bruise/bleed easily.  Psychiatric/Behavioral: Negative for hallucinations, confusion, dysphoric mood    Tolerating Diet: yes      DRUG ALLERGIES:  No Known Allergies  VITALS:  Blood pressure 109/69, pulse 84, temperature 98.7 F (37.1 C), temperature source Oral, resp. rate 13, height 5\' 9"  (1.753 m), weight 40.8 kg, SpO2 92 %.  PHYSICAL EXAMINATION:  Constitutional: Appears frail No distress. HENT: Normocephalic. Marland Kitchen Oropharynx is clear and moist.  Eyes: Conjunctivae and EOM are normal. PERRLA, no scleral icterus.  Neck: Normal ROM. Neck supple. No JVD. No tracheal deviation. CVS: RRR, S1/S2 +, no murmurs, no gallops, no carotid bruit.  Pulmonary: Effort and breath sounds normal, no stridor, rhonchi, wheezes, rales.  Abdominal: Soft. BS +,  no distension, tenderness, rebound or guarding.  Musculoskeletal: Normal range of motion. No edema and no tenderness.  Neuro: Alert. CN 2-12 grossly intact. No focal deficits. Skin: Skin is warm and dry. No rash noted. Psychiatric: Normal mood and  affect.      LABORATORY PANEL:   CBC Recent Labs  Lab 12/15/17 0425  WBC 16.2*  HGB 8.5*  HCT 25.7*  PLT 314   ------------------------------------------------------------------------------------------------------------------  Chemistries  Recent Labs  Lab 12/14/17 1045 12/15/17 0425  NA 139 137  K 3.1* 2.7*  CL 100 100  CO2 27 27  GLUCOSE 88 70  BUN 11 13  CREATININE 0.88 0.75  CALCIUM 8.2* 7.6*  MG 1.4*  --   AST 23  --   ALT 9  --   ALKPHOS 197*  --   BILITOT 0.8  --    ------------------------------------------------------------------------------------------------------------------  Cardiac Enzymes Recent Labs  Lab 12/14/17 1045  TROPONINI 0.03*   ------------------------------------------------------------------------------------------------------------------  RADIOLOGY:  Dg Eye Foreign Body  Result Date: 12/15/2017 CLINICAL DATA:  History of metal removed from eye. EXAM: ORBITS FOR FOREIGN BODY - 2 VIEW COMPARISON:  None. FINDINGS: There is no evidence of metallic foreign body within the orbits. No significant bone abnormality identified. IMPRESSION: No evidence of metallic foreign body within the orbits. Electronically Signed   By: Lucienne Capers M.D.   On: 12/15/2017 03:07   Dg Thoracic Spine 2 View  Result Date: 12/14/2017 CLINICAL DATA:  Back pain EXAM: THORACIC SPINE 2 VIEWS COMPARISON:  None. FINDINGS: Normal alignment. Negative for thoracic fracture or mass. Sclerotic L1 vertebral body as described on the lumbar spine examination. Mild disc degeneration at multiple levels in the thoracic spine. IMPRESSION: Mild thoracic disc degeneration.  Negative for fracture or mass. Electronically Signed   By: Franchot Gallo M.D.   On: 12/14/2017 15:15   Dg Lumbar Spine 2-3 Views  Result Date: 12/14/2017 CLINICAL DATA:  Low back pain.  Fever and leukocytosis. EXAM: LUMBAR SPINE - 2-3  VIEW COMPARISON:  CT abdomen pelvis 12/14/2014 FINDINGS: Sclerotic L1  vertebral body without fracture Sclerotic L4 vertebral body with moderate compression fracture. No fracture on the prior CT. Fracture age is indeterminate. Mild retrolisthesis L3-4. Multilevel disc degeneration with disc space narrowing and spurring L1 through S1 of a moderate degree. Atherosclerotic calcification in the aorta and iliac arteries. IMPRESSION: Sclerotic L1 vertebral body without fracture. Moderate compression fracture L4 with sclerotic vertebral body. Findings are worrisome for metastatic disease. Possible prostate cancer. Recommend MRI lumbar spine without with contrast. If the patient cannot have MRI, CT lumbar spine could be performed Multilevel disc degeneration and spondylosis throughout the lumbar spine. Electronically Signed   By: Franchot Gallo M.D.   On: 12/14/2017 15:14   Mr Lumbar Spine W Wo Contrast  Result Date: 12/15/2017 CLINICAL DATA:  Back pain.  Abnormal x-ray EXAM: MRI LUMBAR SPINE WITHOUT AND WITH CONTRAST TECHNIQUE: Multiplanar and multiecho pulse sequences of the lumbar spine were obtained without and with intravenous contrast. CONTRAST:  14mL MULTIHANCE GADOBENATE DIMEGLUMINE 529 MG/ML IV SOLN COMPARISON:  Lumbar spine radiographs 12/14/2017 FINDINGS: Segmentation:  Normal Alignment:  Mild retrolisthesis L4-5 Vertebrae: Multiple areas of abnormal bone signal throughout the lumbar spine, highly suspicious for metastatic disease. Heterogeneous bone marrow T12 vertebral body. Sclerotic lesion at L1 is noted on x-ray. Decreased signal on T1 and T2 throughout the L1 vertebral body extending into the posterior elements. Pathologic fracture of L4 with abnormal bone marrow diffusely throughout the vertebral body and spinous process compatible with metastatic disease. 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 Paraspinous soft tissue thickening due to extraosseous tumor extension surrounding the vertebral body. Abnormal bone marrow S1 vertebral body also consistent  with metastatic disease. Small lesion left posterior iliac bone adjacent to the SI joint also compatible with tumor. Conus medullaris and cauda equina: Conus extends to the L1-2 level. Conus and cauda equina appear normal. Paraspinal and other soft tissues: Paraspinous soft tissue surrounding the L4 vertebral body consistent with extraosseous tumor. Enhancing mass lateral to the neural foramina on the left at L3-4 with enhancing mass in the left psoas muscle compatible with tumor. Left periaortic lymph node 2 cm, probable malignant adenopathy. Disc levels: T12-L1: Negative for stenosis L1-2: Mild disc degeneration without stenosis L2-3: Moderate disc degeneration with disc bulging and spurring right greater than left. 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 Moderate foraminal stenosis bilaterally L4-5: Central disc protrusion. Disc bulging and facet hypertrophy with moderate to severe spinal stenosis L5-S1: Moderate disc degeneration and spondylosis. Moderate to advanced subarticular stenosis bilaterally. IMPRESSION: Pathologic compression fracture of L4 with ventral epidural tumor and severe spinal stenosis. 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 Left periaortic lymph node enlargement. Numerous additional lesions in the lumbar spine. Findings are consistent with metastatic disease. Favor prostate cancer given sclerotic appearance on x-ray. Recommend correlation with PSA and rectal exam. Mass lesion extends into the left psoas muscle. There is central nonenhancing necrosis in the left psoas muscle most likely due to tumor. Psoas abscess could have a similar appearance. Advanced multilevel degenerative change throughout the lumbar spine as above. Electronically Signed   By: Franchot Gallo M.D.   On: 12/15/2017 07:22   Dg Chest Port 1 View  Result  Date: 12/14/2017 CLINICAL DATA:  Fever EXAM: PORTABLE CHEST 1 VIEW COMPARISON:  None. FINDINGS: The cardiac silhouette is normal in size  and configuration. No mediastinal or hilar masses. There is no evidence of adenopathy. There are thickened bronchovascular markings bilaterally, which are most likely chronic. No evidence of pneumonia or pulmonary edema. No pleural effusion or pneumothorax. Skeletal structures are demineralized but grossly intact. IMPRESSION: No acute cardiopulmonary disease. Electronically Signed   By: Lajean Manes M.D.   On: 12/14/2017 12:14     ASSESSMENT AND PLAN:   72 y/o male with hx of diabetes who presents to ED with back pain. 1. Sepsis: Possibly from PSOAS abscess seen on MRI?  CXR was negative and UA was negative Continue VANC and ZOSYN ID consult requested.  2.  Back pain: MRI concerning for possible metastatic disease.  PSA is within normal limits. Oncology evaluation requested PT consultation requested  3.  Hypokalemia: Pharmacy to replete  4.  Anemia of chronic disease: Iron study shows severe iron deficiency anemia. Oncology evaluation for possible IV iron transfusion  5.  Diabetes: Continue sliding scale   Management plans discussed with the patient's daughter and she is in agreement.  CODE STATUS: Full  TOTAL TIME TAKING CARE OF THIS PATIENT: 30 minutes.     POSSIBLE D/C 2 to 4 days, DEPENDING ON CLINICAL CONDITION.   Jaleel Allen M.D on 12/15/2017 at 11:09 AM  Between 7am to 6pm - Pager - [phone] After 6pm go to [email or web address] - password [email or web address] [facility]  Office  [phone]  CC: Primary care physician; Glean Hess, MD  Note: This dictation was prepared with Dragon dictation along with smaller phrase technology. Any transcriptional errors that result from this process are unintentional.

## 2017-12-15 NOTE — Consult Note (Signed)
PHARMACY CONSULT NOTE - INITIAL   Pharmacy Consult for Electrolyte Monitoring and Replacement Indication: Hypokalemia, Hypomagnesemia    Labs: Recent Labs    12/14/17 1045 12/15/17 0425  WBC 22.5* 16.2*  HGB 9.8* 8.5*  HCT 30.4* 25.7*  PLT 374 314  CREATININE 0.88 0.75  MG 1.4*  --   ALBUMIN 2.7*  --   PROT 6.9  --   AST 23  --   ALT 9  --   ALKPHOS 197*  --   BILITOT 0.8  --    Potassium (mmol/L)  Date Value  12/15/2017 2.7 (LL)   Magnesium (mg/dL)  Date Value  12/14/2017 1.4 (L)   Calcium (mg/dL)  Date Value  12/15/2017 7.6 (L)   Albumin (g/dL)  Date Value  12/14/2017 2.7 (L)  01/01/2016 3.8   Phosphorus (mg/dL)  Date Value  12/14/2014 4.8 (H)  ] Corrected Calcium: 8.64  Estimated Creatinine Clearance: 47.5 mL/min (by C-G formula based on SCr of 0.75 mg/dL).   Medical History: Past Medical History:  Diagnosis Date  . Diabetes mellitus without complication ([place])   . Major depressive disorder, single episode, unspecified 07/18/2017  . Psoriasis   . Skin abnormalities     Assessment: Pharmacy consulted for electrolyte monitoring and replacement in 72 yo male with hypokalemia and hypomagnesemia on admission.   Goal of Therapy:  Electrolytes WNL   Plan:  9/5 Mg: 1.4, K: 2.7- Will order Magnesium 4g Iv x 1 dose and KCL 99mEq every 4 hours x 2 doses. Will recheck K+ @ 1800 and BMP and Mg with AM labs.  Pharmacy will continue to monitor labs and replace electrolytes as needed.   Pernell Dupre, PharmD, BCPS Clinical Pharmacist 12/15/2017 8:31 AM

## 2017-12-15 NOTE — Progress Notes (Signed)
PHARMACIST - PHYSICIAN COMMUNICATION  CONCERNING:  Enoxaparin (Lovenox) for DVT Prophylaxis   RECOMMENDATION: Patient was prescribed enoxaprin 40mg  q24 hours for VTE prophylaxis.   Filed Weights   12/14/17 1043 12/14/17 1600  Weight: 95 lb (43.1 kg) 90 lb (40.8 kg)    Body mass index is 13.29 kg/m.  Estimated Creatinine Clearance: 47.5 mL/min (by C-G formula based on SCr of 0.75 mg/dL).  Patient is candidate for enoxaparin 30mg  every 24 hours based on Weight less then  50kg for male  DESCRIPTION: Pharmacy has adjusted enoxaparin dose.  Patient is now receiving enoxaparin 30mg  every 24 hours.  Pernell Dupre, PharmD, BCPS Clinical Pharmacist 12/15/2017 10:34 AM

## 2017-12-15 NOTE — Progress Notes (Signed)
Pt went to [facility] but did not finish due to pain. MD made aware. Percocet order obtained.

## 2017-12-15 NOTE — Progress Notes (Signed)
Initial Nutrition Assessment  DOCUMENTATION CODES:   Severe malnutrition in context of chronic illness  INTERVENTION:   Nepro Shake po BID, each supplement provides 425 kcal and 19 grams protein  MVI daily  Vitamin C 279m po BID  Dysphagia 2 diet  Magic cup TID with meals, each supplement provides 290 kcal and 9 grams of protein  Recommend monitor K, Mg and P labs as pt at high refeeding risk   NUTRITION DIAGNOSIS:   Severe Malnutrition related to chronic illness(DM, depression) as evidenced by severe fat depletion, severe muscle depletion.  GOAL:   Patient will meet greater than or equal to 90% of their needs  MONITOR:   PO intake, Supplement acceptance, Labs, Weight trends, Skin, I & O's  REASON FOR ASSESSMENT:   Malnutrition Screening Tool    ASSESSMENT:   72y/o male with h/o DM and depression admitted with SIRS secondary to possible psoas muscle abscess    Met with pt in room today. Pt reports poor appetite and oral intake at baseline. Pt does like supplements; pt reports " I will drink whatever you got". Pt reports difficulty chewing foods as he is edentulous; pt requesting ground foods. Pt reports he used to weigh 140lbs many years ago but that he has been 90lbs for some time now. Per chart, pt with 8lb(8%) recent wt loss; unsure if any weight loss is r/t dehydration. RD will order supplements and vitamins to help pt meet his estimated needs. Pt reports eating mashed potatoes for lunch today; reports the [place] too tough to chew. Pt likely at high refeeding risk; recommend monitor K, Mg and P labs.   Medications reviewed and include: lovenox, insulin, zosyn, vancomycin  Labs reviewed: K 2.7(L), Ca 7.6(L), adj. 8.64(L), alb 2.7(L) Wbc- 16.2(L), Hgb 8.5(L), Hct 25.7(L) cbgs- 176, 163 x 24hrs  NUTRITION - FOCUSED PHYSICAL EXAM:    Most Recent Value  Orbital Region  Mild depletion  Upper Arm Region  Severe depletion  Thoracic and Lumbar Region  Severe  depletion  Buccal Region  Moderate depletion  Temple Region  Moderate depletion  Clavicle Bone Region  Moderate depletion  Clavicle and Acromion Bone Region  Moderate depletion  Scapular Bone Region  Severe depletion  Dorsal Hand  Severe depletion  Patellar Region  Severe depletion  Anterior Thigh Region  Severe depletion  Posterior Calf Region  Severe depletion  Edema (RD Assessment)  None  Hair  Reviewed  Eyes  Reviewed  Mouth  Reviewed  Skin  Reviewed  Nails  Reviewed     Diet Order:   Diet Order            DIET DYS 2 Room service appropriate? Yes; Fluid consistency: Thin  Diet effective now             EDUCATION NEEDS:   Education needs have been addressed  Skin:  Skin Assessment: Reviewed RN Assessment  Last BM:  9/3  Height:   Ht Readings from Last 1 Encounters:  12/14/17 5' 9"  (1.753 m)    Weight:   Wt Readings from Last 1 Encounters:  12/14/17 40.8 kg    Ideal Body Weight:  72.7 kg  BMI:  Body mass index is 13.29 kg/m.  Estimated Nutritional Needs:   Kcal:  1500-1700kcal/day   Protein:  61-70g/day   Fluid:  >1.0L/day   CKoleen DistanceMS, RD, LDN Pager #- [phone]#- [phone] Hours Pager: [phone]

## 2017-12-15 NOTE — Care Management Note (Addendum)
Case Management Note  Patient Details  Name: Stephen [name]. MRN: [id number] Date of Birth: [birth year]  Subjective/Objective:                  RNCM spoke with patient and daughter [name] (present in room) to discuss transition of care.  Patient lives alone and has been independent per daughter; drives a little during the day. 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 She hopes that he would go back to [facility] for rehab if needed- patient currently agrees. He had home health services but doesn't know agency name.  He has a walker available at home but doesn't use it. He uses [facility] for medications. Patient is current with Dr. Army Melia.   Action/Plan: RNCM will follow. Home health list left with patient.   Expected Discharge Date:  12/16/17               Expected Discharge Plan:     In-House Referral:     Discharge planning Services     Post Acute Care Choice:    Choice offered to:     DME Arranged:    DME Agency:     HH Arranged:    HH Agency:     Status of Service:     If discussed at [employer], dates discussed:    Additional Comments:  Marshell Garfinkel, RN 12/15/2017, 8:49 AM

## 2017-12-15 NOTE — Progress Notes (Signed)
Pt has Critical lab of potassium of 2.7. Dr Marcille Blanco made aware.

## 2017-12-15 NOTE — Progress Notes (Signed)
PT Cancellation Note  Patient Details Name: Stephen [name]. MRN: [id number] DOB: Nov 30, 1945   Cancelled Treatment:    Reason Eval/Treat Not Completed: Medical issues which prohibited therapy.  Order received.  Chart reviewed.  Pt's potassium level is currently 2.7 and physician has been notified.  Will re-attempt later when more appropriate.   Roxanne Gates, PT, DPT 12/15/2017, 8:27 AM

## 2017-12-15 NOTE — Evaluation (Signed)
Physical Therapy Evaluation Patient Details Name: Stephen [name]. MRN: [id number] DOB: [birth year] Today's Date: 12/15/2017   History of Present Illness  Patient is a 72 year old male admitted for back pain and sepsis.  PMH includes major depressive disorder, DM and psoriasis.  Clinical Impression  Patient is a 72 year old male who lives in a one story apartment alone.  He is independent with mobility at baseline and has a RW which he reports not using most of the time.  Pt was able to perform bed mobility mod I and sit at EOB with no difficulty in balance.  He presented with overall good strength of LE's and pt reported no sensation loss in feet but a slight "numb" feeling.  Pt was able to rise from bedside but required close CGA due to unsteadiness on feet.  PT provided education for use of RW and pt was able to ambulate 40 ft in room.  He reported a "dizzy" feeling which he reported to decrease following sitting and did not respond to questions regarding back pain.  He required education for placement of RW and regarding use of RW for safety during recovery process and for management of pain.  Pt presented with very kyphotic posture and gait deviations which indicate fall risk.  Pt will continue to benfit from skilled PT with focus on strength, tolerance to activity, safety and fall prevention and pain management.    Follow Up Recommendations Home health PT;Supervision for mobility/OOB    Equipment Recommendations  None recommended by PT    Recommendations for Other Services       Precautions / Restrictions Precautions Precautions: Fall Restrictions Weight Bearing Restrictions: No      Mobility  Bed Mobility Overal bed mobility: Modified Independent             General bed mobility comments: Use of bed rail and increased time.  Transfers Overall transfer level: Needs assistance Equipment used: Rolling walker (2 wheeled) Transfers: Sit to/from Stand Sit to Stand: Min  guard         General transfer comment: Pt was able to stand from bedside on his own but required close CGA due to unsteadiness on feet and some reported "light-headedness".  Ambulation/Gait Ambulation/Gait assistance: Min guard Gait Distance (Feet): 40 Feet Assistive device: Rolling walker (2 wheeled)     Gait velocity interpretation: 1.31 - 2.62 ft/sec, indicative of limited community ambulator General Gait Details: Able to ambulate with RW, hold walker anteriorly and did demonstrate some difficulty navigating obstacles.  Moderate foot clearance and step length.  Stairs            Wheelchair Mobility    Modified Rankin (Stroke Patients Only)       Balance Overall balance assessment: Modified Independent                                           Pertinent Vitals/Pain Pain Assessment: No/denies pain(Pt denied pain and only responded that he felt light-headed when asked about pain.)    Home Living Family/patient expects to be discharged to:: Private residence Living Arrangements: Alone Available Help at Discharge: Family;Available PRN/intermittently Type of Home: Apartment Home Access: Level entry     Home Layout: One level Home Equipment: Walker - 2 wheels      Prior Function Level of Independence: Needs assistance   Gait / Transfers Assistance Needed: Has  a RW at home but states that he has not been using it.  ADL's / Homemaking Assistance Needed: Has an aid who assists with bathing and ADL's as needed.        Hand Dominance        Extremity/Trunk Assessment   Upper Extremity Assessment Upper Extremity Assessment: Overall WFL for tasks assessed    Lower Extremity Assessment Lower Extremity Assessment: Overall WFL for tasks assessed(Grossly 4/5 bilaterally.)    Cervical / Trunk Assessment Cervical / Trunk Assessment: Kyphotic(Very kyphotic, flexed posture.)  Communication   Communication: No difficulties  Cognition  Arousal/Alertness: Awake/alert Behavior During Therapy: WFL for tasks assessed/performed Overall Cognitive Status: Within Functional Limits for tasks assessed                                 General Comments: Follows commands consistently.      General Comments      Exercises Other Exercises Other Exercises: Provided education concerning proper use of RW as well as assistance in navigation of obstacles.  Discussed importance of weaning slowly off of use of AD during the recovery process. x8 min.   Assessment/Plan    PT Assessment Patient needs continued PT services  PT Problem List Decreased strength;Decreased mobility;Decreased balance;Decreased knowledge of use of DME       PT Treatment Interventions DME instruction;Therapeutic activities;Gait training;Therapeutic exercise;Patient/family education;Stair training;Balance training;Neuromuscular re-education;Functional mobility training    PT Goals (Current goals can be found in the Care Plan section)  Acute Rehab PT Goals Patient Stated Goal: To return to regular daily activity and to wean from use of RW. PT Goal Formulation: With patient Time For Goal Achievement: 12/29/17 Potential to Achieve Goals: Good    Frequency Min 2X/week   Barriers to discharge        Co-evaluation               AM-PAC PT "6 Clicks" Daily Activity  Outcome Measure Difficulty turning over in bed (including adjusting bedclothes, sheets and blankets)?: A Little Difficulty moving from lying on back to sitting on the side of the bed? : A Little Difficulty sitting down on and standing up from a chair with arms (e.g., wheelchair, bedside commode, etc,.)?: A Little Help needed moving to and from a bed to chair (including a wheelchair)?: A Little Help needed walking in hospital room?: A Little Help needed climbing 3-5 steps with a railing? : A Little 6 Click Score: 18    End of Session Equipment Utilized During Treatment: Gait  belt Activity Tolerance: Patient tolerated treatment well Patient left: in chair;with chair alarm set;with call bell/phone within reach   PT Visit Diagnosis: Unsteadiness on feet (R26.81);Muscle weakness (generalized) (M62.81)    Time: 6754-4920 PT Time Calculation (min) (ACUTE ONLY): 20 min   Charges:   PT Evaluation $PT Eval Low Complexity: 1 Low PT Treatments $Gait Training: 8-22 mins        Roxanne Gates, PT, DPT   Roxanne Gates 12/15/2017, 3:03 PM

## 2017-12-15 NOTE — Consult Note (Signed)
[facility] for Electrolyte Monitoring and Replacement Indication: Hypokalemia, Hypomagnesemia    Labs: Recent Labs    12/14/17 1045 12/15/17 0425  WBC 22.5* 16.2*  HGB 9.8* 8.5*  HCT 30.4* 25.7*  PLT 374 314  CREATININE 0.88 0.75  MG 1.4*  --   ALBUMIN 2.7*  --   PROT 6.9  --   AST 23  --   ALT 9  --   ALKPHOS 197*  --   BILITOT 0.8  --    Potassium (mmol/L)  Date Value  12/15/2017 2.7 (LL)   Magnesium (mg/dL)  Date Value  12/14/2017 1.4 (L)   Calcium (mg/dL)  Date Value  12/15/2017 7.6 (L)   Albumin (g/dL)  Date Value  12/14/2017 2.7 (L)  01/01/2016 3.8   Phosphorus (mg/dL)  Date Value  12/14/2014 4.8 (H)  ] Corrected Calcium: 8.64  Estimated Creatinine Clearance: 47.5 mL/min (by C-G formula based on SCr of 0.75 mg/dL).   Medical History: Past Medical History:  Diagnosis Date  . Diabetes mellitus without complication ([place])   . Major depressive disorder, single episode, unspecified 07/18/2017  . Psoriasis   . Skin abnormalities     Assessment: Pharmacy consulted for electrolyte monitoring and replacement in 72 yo male with hypokalemia and hypomagnesemia on admission.   9/5 Mg: 1.4, K: 2.7 - Magnesium 4g IV x 1 dose and KCL 46mEq every 4 hours x 2 doses.       1808: K = 4.1 mmol/L  Goal of Therapy:  Electrolytes WNL   Plan: No further potassium supplementation needed at this time. Pharmacy will continue to monitor labs and replace electrolytes as needed.   Dallie Piles, PharmD Clinical Pharmacist 12/15/2017 6:47 PM

## 2017-12-16 ENCOUNTER — Other Ambulatory Visit: Payer: Self-pay | Admitting: Hematology and Oncology

## 2017-12-16 ENCOUNTER — Inpatient Hospital Stay: Payer: Medicare Other

## 2017-12-16 DIAGNOSIS — R627 Adult failure to thrive: Secondary | ICD-10-CM

## 2017-12-16 DIAGNOSIS — R590 Localized enlarged lymph nodes: Secondary | ICD-10-CM

## 2017-12-16 DIAGNOSIS — F17210 Nicotine dependence, cigarettes, uncomplicated: Secondary | ICD-10-CM

## 2017-12-16 DIAGNOSIS — Z681 Body mass index (BMI) 19 or less, adult: Secondary | ICD-10-CM

## 2017-12-16 DIAGNOSIS — C801 Malignant (primary) neoplasm, unspecified: Secondary | ICD-10-CM

## 2017-12-16 DIAGNOSIS — C7951 Secondary malignant neoplasm of bone: Secondary | ICD-10-CM

## 2017-12-16 LAB — GLUCOSE, CAPILLARY
GLUCOSE-CAPILLARY: 96 mg/dL (ref 70–99)
GLUCOSE-CAPILLARY: 74 mg/dL (ref 70–99)
Glucose-Capillary: 74 mg/dL (ref 70–99)
Glucose-Capillary: 84 mg/dL (ref 70–99)

## 2017-12-16 LAB — BASIC METABOLIC PANEL
Anion gap: 6 (ref 5–15)
BUN: 14 mg/dL (ref 8–23)
CALCIUM: 8 mg/dL — AB (ref 8.9–10.3)
CO2: 29 mmol/L (ref 22–32)
Chloride: 99 mmol/L (ref 98–111)
Creatinine, Ser: 0.85 mg/dL (ref 0.61–1.24)
GFR calc Af Amer: >60 mL/min (ref >60)
GLUCOSE: 84 mg/dL (ref 70–99)
POTASSIUM: 4 mmol/L (ref 3.5–5.1)
Sodium: 134 mmol/L — ABNORMAL LOW (ref 135–145)

## 2017-12-16 LAB — CBC
HCT: 28 % — ABNORMAL LOW (ref 40.0–52.0)
Hemoglobin: 9 g/dL — ABNORMAL LOW (ref 13.0–18.0)
MCH: 26.2 pg (ref 26.0–34.0)
MCHC: 32.2 g/dL (ref 32.0–36.0)
MCV: 81.6 fL (ref 80.0–100.0)
PLATELETS: 325 10*3/uL (ref 150–440)
RBC: 3.43 MIL/uL — AB (ref 4.40–5.90)
RDW: 16.6 % — AB (ref 11.5–14.5)
WBC: 19 10*3/uL — ABNORMAL HIGH (ref 3.8–10.6)

## 2017-12-16 LAB — MAGNESIUM: Magnesium: 2.3 mg/dL (ref 1.7–2.4)

## 2017-12-16 LAB — LACTATE DEHYDROGENASE: LDH: 214 U/L — ABNORMAL HIGH (ref 98–192)

## 2017-12-16 LAB — APTT: aPTT: 50 seconds — ABNORMAL HIGH (ref 24–36)

## 2017-12-16 LAB — URIC ACID: Uric Acid, Serum: 4.2 mg/dL (ref 3.7–8.6)

## 2017-12-16 LAB — PROTIME-INR
INR: 1.21
Prothrombin Time: 15.2 seconds (ref 11.4–15.2)

## 2017-12-16 LAB — PHOSPHORUS: PHOSPHORUS: 2.5 mg/dL (ref 2.5–4.6)

## 2017-12-16 MED ORDER — METOPROLOL TARTRATE 5 MG/5ML IV SOLN
5.0000 mg | INTRAVENOUS | Status: DC | PRN
  Administered 2017-12-17: 5 mg via INTRAVENOUS
  Filled 2017-12-16: qty 5

## 2017-12-16 MED ORDER — LORAZEPAM 2 MG/ML IJ SOLN
2.0000 mg | Freq: Once | INTRAMUSCULAR | Status: AC
  Administered 2017-12-16: 2 mg via INTRAVENOUS
  Filled 2017-12-16 (×2): qty 1

## 2017-12-16 MED ORDER — IOHEXOL 300 MG/ML  SOLN
60.0000 mL | Freq: Once | INTRAMUSCULAR | Status: AC | PRN
  Administered 2017-12-16: 60 mL via INTRAVENOUS

## 2017-12-16 MED ORDER — DIPHENHYDRAMINE HCL 50 MG/ML IJ SOLN
12.5000 mg | Freq: Once | INTRAMUSCULAR | Status: AC
  Administered 2017-12-16: 12.5 mg via INTRAVENOUS
  Filled 2017-12-16: qty 1

## 2017-12-16 MED ORDER — DEXTROSE-NACL 5-0.9 % IV SOLN
INTRAVENOUS | Status: DC
  Administered 2017-12-16 – 2017-12-25 (×8): via INTRAVENOUS

## 2017-12-16 MED ORDER — SODIUM CHLORIDE 0.9 % IV SOLN
INTRAVENOUS | Status: DC
  Administered 2017-12-16: 14:00:00 via INTRAVENOUS

## 2017-12-16 MED ORDER — ORAL CARE MOUTH RINSE
15.0000 mL | Freq: Two times a day (BID) | OROMUCOSAL | Status: DC
  Administered 2017-12-16 – 2017-12-27 (×10): 15 mL via OROMUCOSAL

## 2017-12-16 MED ORDER — PIPERACILLIN-TAZOBACTAM 3.375 G IVPB
3.3750 g | Freq: Three times a day (TID) | INTRAVENOUS | Status: DC
  Administered 2017-12-16 – 2017-12-17 (×3): 3.375 g via INTRAVENOUS
  Filled 2017-12-16 (×3): qty 50

## 2017-12-16 NOTE — Progress Notes (Addendum)
Pt  Very confused and agitated. Wants to go home. Tried to redirect the patient with no success. Pt trying to hit staff. Refusing cardiac monitor.Pt said he want to go to the hospital because this hospital this is a closed hospital. Daughter called and she said she is on her way. Md notified. Order for ativan and benadryl obtained. Pt refused, and told staff not to touch him. Pt called 911 requesting to be picked up. Police at bedside trying to redirect the patient. Pt sitting at the edge of the bed calling his  family members.

## 2017-12-16 NOTE — Progress Notes (Signed)
Daughter at bedside. Trying to calm pt with no sucsees. Pt still wants to leave and calling other family members for his apartment keys. Daughter asking if she can take him home. Dr. Marcille Blanco notified. MD does not advise pt d/c unless he wants to leave AMA. Daughter made aware.

## 2017-12-16 NOTE — Progress Notes (Signed)
Left VM for Dr. Mike Gip regarding request for ct abd/pelvis w/contrast per dr. Annamaria Boots.

## 2017-12-16 NOTE — Progress Notes (Signed)
ID Pt had CT abdomen and chest and awaiting biopsy. Lymphoma in question He has fever and WBC so okay to treat with antibiotics- Will not give VAnco+ zosyn combo as risk for AKI. As hospitalist restarted zosyn Recommend changing zosyn to cefepime-  ID will follow peripherally this weekend- call if needed

## 2017-12-16 NOTE — Progress Notes (Signed)
Pharmacy Antibiotic Note  Stephen [name]. is a 72 y.o. male admitted on 12/14/2017 with sepsis.  Pharmacy has been consulted for vancomycin and Zosyn dosing. PMH includes COPD, DM, psoriasis. He presented for diffuse back spasms, fever and was found to be hypoxic with generalized weakness and leukocytosis without obvious source other than possibly his bedsores. CXR did not reveal any acute process.   Zosyn discontinued by ID on 9/5, however Dr. Benjie Karvonen would like to restart it due to patient still  having a fever.   Plan: K: 0.042, T1/2: 16.5h, Vd: 30L  Continue Vancomycin 750mg  IV every 24 hours. Calculated trough at  Css: 17.3 mcg/mL  Goal trough 15-20 mcg/mL. Vanc trough ordered prior to the 4th dose.   Restart Zosyn 3.375 IV EI every 8 hours.   Height: 5\' 9"  (175.3 cm) Weight: 90 lb (40.8 kg) IBW/kg (Calculated) : 70.7  Temp (24hrs), Avg:99.2 F (37.3 C), Min:98.1 F (36.7 C), Max:100.9 F (38.3 C)  Recent Labs  Lab 12/14/17 1045 12/15/17 0425 12/16/17 0429  WBC 22.5* 16.2* 19.0*  CREATININE 0.88 0.75 0.85  LATICACIDVEN 1.3  --   --     Estimated Creatinine Clearance: 44.7 mL/min (by C-G formula based on SCr of 0.85 mg/dL).    No Known Allergies  Antimicrobials this admission: Vancomycin 9/4 >>  Zosyn 9/4 >>    Microbiology results: 9/4 BCx: NG x 2 days  9/4 UCx: Insignificant growth  9/5 Aerobic Cx: Moderate GPC  9/5 MRSA PCR (-)   Thank you for allowing pharmacy to be a part of this patient's care.  Pernell Dupre, PharmD, BCPS Clinical Pharmacist 12/16/2017 10:19 AM

## 2017-12-16 NOTE — Care Management Important Message (Signed)
Important Message  Patient Details  Name: Stephen [name]. MRN: [id number] Date of Birth: 06-01-1945   Medicare Important Message Given:  Yes    Juliann Pulse A Sartaj Hoskin 12/16/2017, 11:01 AM

## 2017-12-16 NOTE — Progress Notes (Signed)
Pharmacy Antibiotic Note  Stephen [name]. is a 72 y.o. male admitted on 12/14/2017 with sepsis.  Pharmacy has been consulted for vancomycin and Zosyn dosing. PMH includes COPD, DM, psoriasis. He presented for diffuse back spasms, fever and was found to be hypoxic with generalized weakness and leukocytosis without obvious source other than possibly his bedsores. CXR did not reveal any acute process.   Zosyn discontinued by ID on 9/5.  Plan: K: 0.042, T1/2: 16.5h, Vd: 30L  Continue Vancomycin 750mg  IV every 24 hours. Calculated trough at  Css: 17.3 mcg/mL  Goal trough 15-20 mcg/mL. Vanc trough ordered prior to the 4th dose.    Height: 5\' 9"  (175.3 cm) Weight: 90 lb (40.8 kg) IBW/kg (Calculated) : 70.7  Temp (24hrs), Avg:98.5 F (36.9 C), Min:98.1 F (36.7 C), Max:98.7 F (37.1 C)  Recent Labs  Lab 12/14/17 1045 12/15/17 0425 12/16/17 0429  WBC 22.5* 16.2* 19.0*  CREATININE 0.88 0.75 0.85  LATICACIDVEN 1.3  --   --     Estimated Creatinine Clearance: 44.7 mL/min (by C-G formula based on SCr of 0.85 mg/dL).    No Known Allergies  Antimicrobials this admission: Vancomycin 9/4 >>  Zosyn 9/4 >>  9/5  Microbiology results: 9/4 BCx: NG x 2 days  9/4 UCx: Insignificant growth  9/5 Aerobic Cx: Moderate GPC  9/5 MRSA PCR (-)   Thank you for allowing pharmacy to be a part of this patient's care.  Pernell Dupre, PharmD, BCPS Clinical Pharmacist 12/16/2017 7:26 AM

## 2017-12-16 NOTE — Progress Notes (Signed)
PT Cancellation Note  Patient Details Name: Stephen [name]. MRN: [id number] DOB: Jan 13, 1946   Cancelled Treatment:    Reason Eval/Treat Not Completed: Fatigue/lethargy limiting ability to participate;Other (comment)(Patient unable to state his name, keep eyes open or follow commands. PT will follow up as able.)   Lieutenant Diego PT, DPT 2:05 PM,12/16/17 [phone]

## 2017-12-16 NOTE — Progress Notes (Signed)
Per Dr. Thurman Coyer Radiologist-patient needs a CT Abdomen and Pelvis with contrast before ordered CT biopsy can be considered.  Please have MD place order.

## 2017-12-16 NOTE — Consult Note (Signed)
[name]  Date of admission:  12/14/2017  Inpatient day:  12/16/2017  Consulting physician: Dr. Bettey Costa   Reason for Consultation:  Mets on spine  Chief Complaint: Stephen [name]. is a 72 y.o. male who was admitted through the emergency room with generalized weakness, leukocytosis, and concern for sepsis.  HPI:  The patient has a 40-pack-year smoking history.  He has a significant alcohol history.  He has been followed by [facility] for chronic eczematous dermatitis (last seen 07/26/2016).  He was seen by Mortimer Fries, PA on 06/27/2017 and noted to have failure to thrive.  Weight was 111 pounds.  He presented with a several month history of back pain.  He also described a 50 pound weight loss over an unspecified period of time.  Neighbors called the paramedics because he was weak and bedridden.  He lives alone.  Initial labs included a hematocrit of 30.4, hemoglobin 9.8, platelets 374,000, white count 22,500 with an ANC of 18,300.  Creatinine was 0.88.  Calcium was 8.2.  Albumin 2.7.  Liver function tests were normal. Troponin was 0.03.  Blood and urine cultures negative to date. PSA 3.65. HIV is pending.  Anemia workup included the following normal labs: ferritin (122), folate, and B12.  Iron saturation was 9% with a TIBC of 187. Retic was 1.7% (low).  CXR on 12/14/2017 revealed no acute abnormality.  He underwent evaluation for his back pain.  Lumbar spine series on 12/14/2017 revealed sclerotic L1 vertebral body without fracture. There was moderate compression fracture L4 with sclerotic vertebral body. Findings were worrisome for metastatic disease. Possible prostate cancer. Recommend MRI lumbar spine with and without contrast.  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 There was left  periaortic lymph node enlargement.  There were numerous additional lesions in the lumbar spine. Findings were consistent with metastatic disease. Mass lesion extended into the left psoas muscle. There was central nonenhancing necrosis in the left psoas muscle most likely due to tumor. Psoas abscess could have a similar appearance.  He was empirically started on vancomycin and Zosyn.  Last EGD was 05/02/2015.  Last evening, he became confused and combative.  He called 911 from his hospital room.  He received Ativan 2 mg.  This morning he is lethargic/sleepy.   Past Medical History:  Diagnosis Date  . Diabetes mellitus without complication ([facility])   . Major depressive disorder, single episode, unspecified 07/18/2017  . Psoriasis   . Skin abnormalities     Past Surgical History:  Procedure Laterality Date  . COLONOSCOPY  1995   one polyp  . ESOPHAGOGASTRODUODENOSCOPY N/A 05/02/2015   Procedure: ESOPHAGOGASTRODUODENOSCOPY (EGD);  Surgeon: Hulen Luster, MD;  Location: [facility] ENDOSCOPY;  Service: Endoscopy;  Laterality: N/A;    Family History  Problem Relation Age of Onset  . Heart disease Mother   . Migraines Father   . Aneurysm Father   . Cancer Brother     Social History:  reports that he has been smoking cigarettes and cigars. He has a 65.00 pack-year smoking history. He has quit using smokeless tobacco. He reports that he drank alcohol. He reports that he has current or past drug history.  The patient's daughter's name is [name] [phone]).  The patient is initially alone then accompanied by his nurse.    Allergies: No Known Allergies  Medications Prior  to Admission  Medication Sig Dispense Refill  . metFORMIN (GLUCOPHAGE) 500 MG tablet Take 1 tablet (500 mg total) by mouth 4 (four) times daily. 360 tablet 0  . baclofen (LIORESAL) 10 MG tablet Take 1 tablet (10 mg total) by mouth 3 (three) times daily as needed for muscle spasms. 90 each 3  . diphenhydrAMINE (BENADRYL) 25 mg  capsule TAKE 1 CAPSULE (25 MG TOTAL) BY MOUTH EVERY 4 (FOUR) HOURS AS NEEDED FOR ITCHING. 100 capsule 5  . glucose blood (ONE TOUCH ULTRA TEST) test strip TEST BY OTHER ROUTE DAILY AT 0600. 100 each 4  . insulin aspart (NOVOLOG FLEXPEN) 100 UNIT/ML FlexPen Inject 4 Units into the skin daily as needed for high blood sugar (for BS > 200). 15 mL 0  . Insulin Pen Needle (PEN NEEDLES) 31G X 5 MM MISC 1 each by Does not apply route daily. 50 each 5  . naproxen sodium (ALEVE) 220 MG tablet Take 440 mg by mouth daily as needed.    . triamcinolone ointment (KENALOG) 0.1 % Apply 2 application topically 2 (two) times daily. 453.6 g 3    Review of Systems: Patient is unable to provide any history.  Per chart review: GENERAL: No fevers, chills, or known sweats.  Weight loss of 50 pounds over several months (21 pounds since 06/2017). PERFORMANCE STATUS (ECOG):  3 HEENT:  No visual changes, runny nose, sore throat, mouth sores or tenderness. Lungs: No shortness of breath.  Cough.  No hemoptysis. Cardiac:  No chest pain, palpitations, orthopnea, or PND. GI:  Poor appetite.  No nausea, vomiting, diarrhea, constipation, melena or hematochezia. GU:  No urgency, frequency, dysuria, or hematuria. Musculoskeletal:  Back pain.  No joint pain.  No muscle tenderness. Extremities:  No pain or swelling. Skin:  Psoriasis. Neuro:  General weakness.  No headache, numbness or weakness, balance or coordination issues. Endocrine:  Diabetes.  No thyroid issues, hot flashes or night sweats. Psych:  Depression.  No anxiety. Pain:  Back pain x months. Review of systems:  All other systems reviewed and found to be negative.  Physical Exam:  Blood pressure 134/79, pulse 86, temperature 98.6 F (37 C), temperature source Oral, resp. rate 18, height 5' 9"  (1.753 m), weight 90 lb (40.8 kg), SpO2 100 %.  GENERAL:  Chronically ill appearing thin gentleman lying in bed in a fetal position.  MENTAL STATUS:  Responsive to touch and  sound.  Moans and cried out when moved. HEAD:  Male pattern baldness.  Gray beard.  Normocephalic, atraumatic, face symmetric, no Cushingoid features. EYES:  Brown eyes.  Pupils equal round and reactive to light and accomodation.  No conjunctivitis or scleral icterus. ENT: Clenches jaw.  Patient will not open mouth.  RESPIRATORY:  Clear to auscultation anteriorly without rales, wheezes or rhonchi. CARDIOVASCULAR:  Regular rate and rhythm without murmur, rub or gallop. ABDOMEN:  Soft, non-tender, with active bowel sounds, and no appreciable hepatosplenomegaly.  No masses. SKIN:  No rashes, ulcers or lesions. EXTREMITIES: Thin.  No edema, no skin discoloration or tenderness.  No palpable cords. LYMPH NODES: Conglomerate right axillary adenopathy.  Small left inguinal and and shotty right inguinal adenopathy.  No palpable cervical, or supraclavicular adenopathy.  NEUROLOGICAL: Unable to cooperate.  Does not follow commands.  Responds to sound and touch.  Moves all 4 extremities. PSYCH:  Sedated.   Results for orders placed or performed during the hospital encounter of 12/14/17 (from the past 48 hour(s))  Blood gas, venous ([facility], [facility], [facility])  Status: Abnormal   Collection Time: 12/14/17 10:37 AM  Result Value Ref Range   pH, Ven 7.47 (H) 7.250 - 7.430   pCO2, Ven 36 (L) 44.0 - 60.0 mmHg   pO2, Ven 70.0 (H) 32.0 - 45.0 mmHg   Bicarbonate 26.2 20.0 - 28.0 mmol/L   Acid-Base Excess 2.5 (H) 0.0 - 2.0 mmol/L   O2 Saturation 94.9 %   Patient temperature 37.0    Collection site VEIN    Sample type VENOUS     Comment: Performed at [facility], [address] [place], [place] [address]  Lactic acid, plasma     Status: None   Collection Time: 12/14/17 10:45 AM  Result Value Ref Range   Lactic Acid, Venous 1.3 0.5 - 1.9 mmol/L    Comment: Performed at [facility], [address] [place], [place] [address]  Comprehensive metabolic panel     Status: Abnormal   Collection Time:  12/14/17 10:45 AM  Result Value Ref Range   Sodium 139 135 - 145 mmol/L   Potassium 3.1 (L) 3.5 - 5.1 mmol/L   Chloride 100 98 - 111 mmol/L   CO2 27 22 - 32 mmol/L   Glucose, Bld 88 70 - 99 mg/dL   BUN 11 8 - 23 mg/dL   Creatinine, Ser 0.88 0.61 - 1.24 mg/dL   Calcium 8.2 (L) 8.9 - 10.3 mg/dL   Total Protein 6.9 6.5 - 8.1 g/dL   Albumin 2.7 (L) 3.5 - 5.0 g/dL   AST 23 15 - 41 U/L   ALT 9 0 - 44 U/L   Alkaline Phosphatase 197 (H) 38 - 126 U/L   Total Bilirubin 0.8 0.3 - 1.2 mg/dL   GFR calc non Af Amer >60 >60 mL/min   GFR calc Af Amer >60 >60 mL/min    Comment: (NOTE) The eGFR has been calculated using the CKD EPI equation. This calculation has not been validated in all clinical situations. eGFR's persistently <60 mL/min signify possible Chronic Kidney Disease.    Anion gap 12 5 - 15    Comment: Performed at [facility], [address] [place], [place] [address]  Troponin I     Status: Abnormal   Collection Time: 12/14/17 10:45 AM  Result Value Ref Range   Troponin I 0.03 (HH) <0.03 ng/mL    Comment: CRITICAL RESULT CALLED TO, READ BACK BY AND VERIFIED WITH KELLEY PENDLETON AT 1153 12/14/17 DAS Performed at [facility], [address] [place], [place] [address]   CBC WITH DIFFERENTIAL     Status: Abnormal   Collection Time: 12/14/17 10:45 AM  Result Value Ref Range   WBC 22.5 (H) 3.8 - 10.6 K/uL   RBC 3.78 (L) 4.40 - 5.90 MIL/uL   Hemoglobin 9.8 (L) 13.0 - 18.0 g/dL   HCT 30.4 (L) 40.0 - 52.0 %   MCV 80.3 80.0 - 100.0 fL   MCH 25.8 (L) 26.0 - 34.0 pg   MCHC 32.1 32.0 - 36.0 g/dL   RDW 16.8 (H) 11.5 - 14.5 %   Platelets 374 150 - 440 K/uL   Neutrophils Relative % 82 %   Neutro Abs 18.3 (H) 1.4 - 6.5 K/uL   Lymphocytes Relative 12 %   Lymphs Abs 2.6 1.0 - 3.6 K/uL   Monocytes Relative 6 %   Monocytes Absolute 1.3 (H) 0.2 - 1.0 K/uL   Eosinophils Relative 1 %   Eosinophils Absolute 0.2 0 - 0.7 K/uL   Basophils Relative  1 %   Basophils Absolute  0.1 0 - 0.1 K/uL    Comment: Performed at [facility], [address] [place], [place] [address]  Blood Culture (routine x 2)     Status: None (Preliminary result)   Collection Time: 12/14/17 10:45 AM  Result Value Ref Range   Specimen Description BLOOD BLOOD LEFT ARM    Special Requests      BOTTLES DRAWN AEROBIC AND ANAEROBIC Blood Culture adequate volume   Culture      NO GROWTH < 24 HOURS Performed at [facility], [address] [place], [place] [address]    Report Status PENDING   Blood Culture (routine x 2)     Status: None (Preliminary result)   Collection Time: 12/14/17 10:45 AM  Result Value Ref Range   Specimen Description BLOOD LEFT ANTECUBITAL    Special Requests      BOTTLES DRAWN AEROBIC AND ANAEROBIC Blood Culture adequate volume   Culture      NO GROWTH < 24 HOURS Performed at [facility], [address], [place], [place] [address]    Report Status PENDING   Urine culture     Status: Abnormal   Collection Time: 12/14/17 10:45 AM  Result Value Ref Range   Specimen Description      URINE, RANDOM Performed at [facility], [address] [place], [place] [address]    Special Requests      NONE Performed at [facility], [address], [place], [place] [address]    Culture (A)     <10,000 COLONIES/mL INSIGNIFICANT GROWTH Performed at [facility], [place] [address] [place], [place] [address]    Report Status 12/15/2017 FINAL   Magnesium     Status: Abnormal   Collection Time: 12/14/17 10:45 AM  Result Value Ref Range   Magnesium 1.4 (L) 1.7 - 2.4 mg/dL    Comment: Performed at [facility], [address] [place], [place] [address]  Urinalysis, Complete w Microscopic     Status: Abnormal   Collection Time: 12/14/17 10:53 AM  Result Value Ref Range   Color, Urine STRAW (A) YELLOW   APPearance CLEAR (A) CLEAR   Specific Gravity, Urine 1.008 1.005 - 1.030   pH 7.0 5.0 - 8.0   Glucose,  UA NEGATIVE NEGATIVE mg/dL   Hgb urine dipstick MODERATE (A) NEGATIVE   Bilirubin Urine NEGATIVE NEGATIVE   Ketones, ur 5 (A) NEGATIVE mg/dL   Protein, ur NEGATIVE NEGATIVE mg/dL   Nitrite NEGATIVE NEGATIVE   Leukocytes, UA NEGATIVE NEGATIVE   RBC / HPF 0-5 0 - 5 RBC/hpf   WBC, UA NONE SEEN 0 - 5 WBC/hpf   Bacteria, UA NONE SEEN NONE SEEN   Squamous Epithelial / LPF NONE SEEN 0 - 5    Comment: Performed at [facility], [address] [place], [place] [address]  Glucose, capillary     Status: None   Collection Time: 12/14/17  4:57 PM  Result Value Ref Range   Glucose-Capillary 91 70 - 99 mg/dL   Comment 1 Notify RN   PSA     Status: None   Collection Time: 12/14/17  5:03 PM  Result Value Ref Range   Prostatic Specific Antigen 3.65 0.00 - 4.00 ng/mL    Comment: (NOTE) While PSA levels of <=4.0 ng/ml are reported as reference range, some men with levels below 4.0 ng/ml can have prostate cancer and many men with PSA above 4.0  ng/ml do not have prostate cancer.  Other tests such as free PSA, age specific reference ranges, PSA velocity and PSA doubling time may be helpful especially in men less than 36 years old. Performed at [facility], [place] [address], [place], [place] [address]   Glucose, capillary     Status: Abnormal   Collection Time: 12/14/17  9:08 PM  Result Value Ref Range   Glucose-Capillary 117 (H) 70 - 99 mg/dL   Comment 1 Notify RN   Basic metabolic panel     Status: Abnormal   Collection Time: 12/15/17  4:25 AM  Result Value Ref Range   Sodium 137 135 - 145 mmol/L   Potassium 2.7 (LL) 3.5 - 5.1 mmol/L    Comment: CRITICAL RESULT CALLED TO, READ BACK BY AND VERIFIED WITH OLIVIA KIBERENGE AT 4967 ON 12/15/17 [place].    Chloride 100 98 - 111 mmol/L   CO2 27 22 - 32 mmol/L   Glucose, Bld 70 70 - 99 mg/dL   BUN 13 8 - 23 mg/dL   Creatinine, Ser 0.75 0.61 - 1.24 mg/dL   Calcium 7.6 (L) 8.9 - 10.3 mg/dL   GFR calc non Af Amer >60 >60 mL/min   GFR calc  Af Amer >60 >60 mL/min    Comment: (NOTE) The eGFR has been calculated using the CKD EPI equation. This calculation has not been validated in all clinical situations. eGFR's persistently <60 mL/min signify possible Chronic Kidney Disease.    Anion gap 10 5 - 15    Comment: Performed at [facility], [address] [place], [place] [address]  CBC     Status: Abnormal   Collection Time: 12/15/17  4:25 AM  Result Value Ref Range   WBC 16.2 (H) 3.8 - 10.6 K/uL   RBC 3.20 (L) 4.40 - 5.90 MIL/uL   Hemoglobin 8.5 (L) 13.0 - 18.0 g/dL   HCT 25.7 (L) 40.0 - 52.0 %   MCV 80.3 80.0 - 100.0 fL   MCH 26.4 26.0 - 34.0 pg   MCHC 32.9 32.0 - 36.0 g/dL   RDW 16.9 (H) 11.5 - 14.5 %   Platelets 314 150 - 440 K/uL    Comment: Performed at [facility], [address] [place], [place] [address]  Folate     Status: None   Collection Time: 12/15/17  4:25 AM  Result Value Ref Range   Folate 6.2 >5.9 ng/mL    Comment: Performed at [facility], [address] [place], [place] [address]  Iron and TIBC     Status: Abnormal   Collection Time: 12/15/17  4:25 AM  Result Value Ref Range   Iron 16 (L) 45 - 182 ug/dL   TIBC 187 (L) 250 - 450 ug/dL   Saturation Ratios 9 (L) 17.9 - 39.5 %   UIBC 171 ug/dL    Comment: Performed at [facility], [address] [place], [place] [address]  Ferritin     Status: None   Collection Time: 12/15/17  4:25 AM  Result Value Ref Range   Ferritin 122 24 - 336 ng/mL    Comment: Performed at [facility], [address] [place], [place] [address]  Reticulocytes     Status: Abnormal   Collection Time: 12/15/17  4:25 AM  Result Value Ref Range   Retic Ct Pct 1.7 0.4 - 3.1 %   RBC. 3.32 (L) 4.40 - 5.90 MIL/uL   Retic Count, Absolute 56.4 19.0 -  183.0 K/uL    Comment: Performed at [facility], [address] [place], [place] [address]  Glucose, capillary     Status: Abnormal   Collection Time: 12/15/17  8:15  AM  Result Value Ref Range   Glucose-Capillary 176 (H) 70 - 99 mg/dL   Comment 1 Notify RN   Vitamin B12     Status: None   Collection Time: 12/15/17  8:58 AM  Result Value Ref Range   Vitamin B-12 841 180 - 914 pg/mL    Comment: (NOTE) This assay is not validated for testing neonatal or myeloproliferative syndrome specimens for Vitamin B12 levels. Performed at [facility], [place] [address] [place], [place] [address]   MRSA PCR Screening     Status: None   Collection Time: 12/15/17  9:04 AM  Result Value Ref Range   MRSA by PCR NEGATIVE NEGATIVE    Comment:        The GeneXpert MRSA Assay (FDA approved for NASAL specimens only), is one component of a comprehensive MRSA colonization surveillance program. It is not intended to diagnose MRSA infection nor to guide or monitor treatment for MRSA infections. Performed at [facility], [address] [place], [place] [address]   Glucose, capillary     Status: Abnormal   Collection Time: 12/15/17 11:41 AM  Result Value Ref Range   Glucose-Capillary 163 (H) 70 - 99 mg/dL   Comment 1 Notify RN   Glucose, capillary     Status: Abnormal   Collection Time: 12/15/17  4:41 PM  Result Value Ref Range   Glucose-Capillary 140 (H) 70 - 99 mg/dL   Comment 1 Notify RN   Aerobic Culture (superficial specimen)     Status: None (Preliminary result)   Collection Time: 12/15/17  5:40 PM  Result Value Ref Range   Specimen Description      SKIN Performed at [facility], [address], [place], [place] [address]    Special Requests      NONE Performed at [facility], [place], [place] [address]    Gram Stain      ABUNDANT WBC PRESENT,BOTH PMN AND MONONUCLEAR MODERATE GRAM POSITIVE COCCI Performed at [facility], [place] [address], [place], [place] [address]    Culture PENDING    Report Status PENDING   Potassium     Status: None   Collection Time: 12/15/17  6:08 PM  Result  Value Ref Range   Potassium 4.1 3.5 - 5.1 mmol/L    Comment: Performed at [facility], [address] [place], [place] [address]  Glucose, capillary     Status: Abnormal   Collection Time: 12/15/17  9:08 PM  Result Value Ref Range   Glucose-Capillary 177 (H) 70 - 99 mg/dL   Comment 1 Notify RN   Magnesium     Status: None   Collection Time: 12/16/17  4:29 AM  Result Value Ref Range   Magnesium 2.3 1.7 - 2.4 mg/dL    Comment: Performed at [facility], [address] [place], [place] [address]  CBC     Status: Abnormal   Collection Time: 12/16/17  4:29 AM  Result Value Ref Range   WBC 19.0 (H) 3.8 - 10.6 K/uL   RBC 3.43 (L) 4.40 - 5.90 MIL/uL   Hemoglobin 9.0 (L) 13.0 - 18.0 g/dL   HCT 28.0 (L) 40.0 - 52.0 %   MCV 81.6 80.0 - 100.0 fL  MCH 26.2 26.0 - 34.0 pg   MCHC 32.2 32.0 - 36.0 g/dL   RDW 16.6 (H) 11.5 - 14.5 %   Platelets 325 150 - 440 K/uL    Comment: Performed at [facility], [address] [place], [place] [address]  Basic metabolic panel     Status: Abnormal   Collection Time: 12/16/17  4:29 AM  Result Value Ref Range   Sodium 134 (L) 135 - 145 mmol/L   Potassium 4.0 3.5 - 5.1 mmol/L   Chloride 99 98 - 111 mmol/L   CO2 29 22 - 32 mmol/L   Glucose, Bld 84 70 - 99 mg/dL   BUN 14 8 - 23 mg/dL   Creatinine, Ser 0.85 0.61 - 1.24 mg/dL   Calcium 8.0 (L) 8.9 - 10.3 mg/dL   GFR calc non Af Amer >60 >60 mL/min   GFR calc Af Amer >60 >60 mL/min    Comment: (NOTE) The eGFR has been calculated using the CKD EPI equation. This calculation has not been validated in all clinical situations. eGFR's persistently <60 mL/min signify possible Chronic Kidney Disease.    Anion gap 6 5 - 15    Comment: Performed at [facility], [address] [place], [place] [address]  Phosphorus     Status: None   Collection Time: 12/16/17  4:29 AM  Result Value Ref Range   Phosphorus 2.5 2.5 - 4.6 mg/dL    Comment: Performed at [facility], [address] [place], [place] [address]  Protime-INR     Status: None   Collection Time: 12/16/17  4:29 AM  Result Value Ref Range   Prothrombin Time 15.2 11.4 - 15.2 seconds   INR 1.21     Comment: Performed at [facility] [address] [place], [place] [address]  APTT     Status: Abnormal   Collection Time: 12/16/17  4:29 AM  Result Value Ref Range   aPTT 50 (H) 24 - 36 seconds    Comment:        IF BASELINE aPTT IS ELEVATED, SUGGEST PATIENT RISK ASSESSMENT BE USED TO DETERMINE APPROPRIATE ANTICOAGULANT THERAPY. Performed at [facility], [address] [place], [place] [address]   Lactate dehydrogenase     Status: Abnormal   Collection Time: 12/16/17  4:29 AM  Result Value Ref Range   LDH 214 (H) 98 - 192 U/L    Comment: Performed at [facility], [address] [place], [place] [address]  Uric acid     Status: None   Collection Time: 12/16/17  4:29 AM  Result Value Ref Range   Uric Acid, Serum 4.2 3.7 - 8.6 mg/dL    Comment: Performed at [facility], [address] [place],  [address]   Dg Eye Foreign Body  Result Date: 12/15/2017 CLINICAL DATA:  History of metal removed from eye. EXAM: ORBITS FOR FOREIGN BODY - 2 VIEW COMPARISON:  None. FINDINGS: There is no evidence of metallic foreign body within the orbits. No significant bone abnormality identified. IMPRESSION: No evidence of metallic foreign body within the orbits. Electronically Signed   By: Lucienne Capers M.D.   On: 12/15/2017 03:07   Dg Thoracic Spine 2 View  Result Date: 12/14/2017 CLINICAL DATA:  Back pain EXAM: THORACIC SPINE 2 VIEWS COMPARISON:  None. FINDINGS: Normal alignment. Negative for thoracic fracture or mass. Sclerotic L1 vertebral body as described on the lumbar spine examination. Mild disc degeneration at multiple levels in  the thoracic spine. IMPRESSION: Mild thoracic disc degeneration.  Negative for fracture or mass. Electronically Signed    By: Franchot Gallo M.D.   On: 12/14/2017 15:15   Dg Lumbar Spine 2-3 Views  Result Date: 12/14/2017 CLINICAL DATA:  Low back pain.  Fever and leukocytosis. EXAM: LUMBAR SPINE - 2-3 VIEW COMPARISON:  CT abdomen pelvis 12/14/2014 FINDINGS: Sclerotic L1 vertebral body without fracture Sclerotic L4 vertebral body with moderate compression fracture. No fracture on the prior CT. Fracture age is indeterminate. Mild retrolisthesis L3-4. Multilevel disc degeneration with disc space narrowing and spurring L1 through S1 of a moderate degree. Atherosclerotic calcification in the aorta and iliac arteries. IMPRESSION: Sclerotic L1 vertebral body without fracture. Moderate compression fracture L4 with sclerotic vertebral body. Findings are worrisome for metastatic disease. Possible prostate cancer. Recommend MRI lumbar spine without with contrast. If the patient cannot have MRI, CT lumbar spine could be performed Multilevel disc degeneration and spondylosis throughout the lumbar spine. Electronically Signed   By: Franchot Gallo M.D.   On: 12/14/2017 15:14   Mr Lumbar Spine W Wo Contrast  Result Date: 12/15/2017 CLINICAL DATA:  Back pain.  Abnormal x-ray EXAM: MRI LUMBAR SPINE WITHOUT AND WITH CONTRAST TECHNIQUE: Multiplanar and multiecho pulse sequences of the lumbar spine were obtained without and with intravenous contrast. CONTRAST:  34m MULTIHANCE GADOBENATE DIMEGLUMINE 529 MG/ML IV SOLN COMPARISON:  Lumbar spine radiographs 12/14/2017 FINDINGS: Segmentation:  Normal Alignment:  Mild retrolisthesis L4-5 Vertebrae: Multiple areas of abnormal bone signal throughout the lumbar spine, highly suspicious for metastatic disease. Heterogeneous bone marrow T12 vertebral body. Sclerotic lesion at L1 is noted on x-ray. Decreased signal on T1 and T2 throughout the L1 vertebral body extending into the posterior elements. Pathologic fracture of L4 with abnormal bone marrow diffusely throughout the vertebral body and spinous process  compatible with metastatic disease. 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 Paraspinous soft tissue thickening due to extraosseous tumor extension surrounding the vertebral body. Abnormal bone marrow S1 vertebral body also consistent with metastatic disease. Small lesion left posterior iliac bone adjacent to the SI joint also compatible with tumor. Conus medullaris and cauda equina: Conus extends to the L1-2 level. Conus and cauda equina appear normal. Paraspinal and other soft tissues: Paraspinous soft tissue surrounding the L4 vertebral body consistent with extraosseous tumor. Enhancing mass lateral to the neural foramina on the left at L3-4 with enhancing mass in the left psoas muscle compatible with tumor. Left periaortic lymph node 2 cm, probable malignant adenopathy. Disc levels: T12-L1: Negative for stenosis L1-2: Mild disc degeneration without stenosis L2-3: Moderate disc degeneration with disc bulging and spurring right greater than left. 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 Moderate foraminal stenosis bilaterally L4-5: Central disc protrusion. Disc bulging and facet hypertrophy with moderate to severe spinal stenosis L5-S1: Moderate disc degeneration and spondylosis. Moderate to advanced subarticular stenosis bilaterally. IMPRESSION: Pathologic compression fracture of L4 with ventral epidural tumor and severe spinal stenosis. 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 Left periaortic lymph node enlargement. Numerous additional lesions in the lumbar spine. Findings are consistent with metastatic disease. Favor prostate cancer given sclerotic appearance on x-ray. Recommend correlation with PSA and rectal exam. Mass lesion extends into the left psoas muscle. There is central nonenhancing necrosis in the left psoas muscle most  likely due to tumor. Psoas abscess could have a similar appearance. Advanced  multilevel degenerative change throughout the lumbar spine as above. Electronically Signed   By: Franchot Gallo M.D.   On: 12/15/2017 07:22   Dg Chest Port 1 View  Result Date: 12/14/2017 CLINICAL DATA:  Fever EXAM: PORTABLE CHEST 1 VIEW COMPARISON:  None. FINDINGS: The cardiac silhouette is normal in size and configuration. No mediastinal or hilar masses. There is no evidence of adenopathy. There are thickened bronchovascular markings bilaterally, which are most likely chronic. No evidence of pneumonia or pulmonary edema. No pleural effusion or pneumothorax. Skeletal structures are demineralized but grossly intact. IMPRESSION: No acute cardiopulmonary disease. Electronically Signed   By: Lajean Manes M.D.   On: 12/14/2017 12:14    Assessment:  The patient is a 72 y.o. gentleman with metastatic disease. He presented with failure to thrive.  He has lost a significant amount of weight.  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 There was left periaortic lymph node enlargement.  There were numerous additional lesions in the lumbar spine. Findings were consistent with metastatic disease. Mass lesion extended into the left psoas muscle. There was central nonenhancing necrosis in the left psoas muscle most likely due to tumor. Psoas abscess could have a similar appearance.  CXR on 12/14/2017 revealed no acute abnormality.  PSA was 3.65 on 12/14/2017.  Anemia workup included the following normal labs: ferritin (122), folate, and B12.  Iron saturation was 9% with a TIBC of 187. Retic was 1.7% (low).  Symptomatically, he is sedated after an episode of agitation and confusion last night.  Plan:   1.  Labs:  LDH, uric acid, CEA, myeloma panel, PT,  PTT. 2.  Imaging reviewed with radiology.  No obvious primary.  Possible lymphoma given palpable right axillary adenopathy and periaortic lymph node involvement.  Will need biopsy for confirmation.  Radiology requests chest, abdomen and pelvic CT to assess full extent of disease prior to planned biopsy for diagnosis.  Patient unable to give informed consent for any procedures this morning.  Will discuss with him tomorrow.   Thank you for allowing me to participate in Sylvain Hasten. 's care.  I will follow him closely with you while hospitalized and after discharge in the outpatient department.   Lequita Asal, MD  12/16/2017, 6:06 AM

## 2017-12-16 NOTE — Consult Note (Signed)
[facility] for Electrolyte Monitoring and Replacement Indication: Hypokalemia, Hypomagnesemia    Labs: Recent Labs    12/14/17 1045 12/15/17 0425 12/16/17 0429  WBC 22.5* 16.2* 19.0*  HGB 9.8* 8.5* 9.0*  HCT 30.4* 25.7* 28.0*  PLT 374 314 325  APTT  --   --  50*  CREATININE 0.88 0.75 0.85  MG 1.4*  --  2.3  PHOS  --   --  2.5  ALBUMIN 2.7*  --   --   PROT 6.9  --   --   AST 23  --   --   ALT 9  --   --   ALKPHOS 197*  --   --   BILITOT 0.8  --   --    Potassium (mmol/L)  Date Value  12/16/2017 4.0   Magnesium (mg/dL)  Date Value  12/16/2017 2.3   Calcium (mg/dL)  Date Value  12/16/2017 8.0 (L)   Albumin (g/dL)  Date Value  12/14/2017 2.7 (L)  01/01/2016 3.8   Phosphorus (mg/dL)  Date Value  12/16/2017 2.5  ] Corrected Calcium: 8.64  Estimated Creatinine Clearance: 44.7 mL/min (by C-G formula based on SCr of 0.85 mg/dL).   Medical History: Past Medical History:  Diagnosis Date  . Diabetes mellitus without complication ([place])   . Major depressive disorder, single episode, unspecified 07/18/2017  . Psoriasis   . Skin abnormalities     Assessment: Pharmacy consulted for electrolyte monitoring and replacement in 72 yo male with hypokalemia and hypomagnesemia on admission.    Goal of Therapy:  Electrolytes WNL   Plan:  9/6 K: 4.0, Phos: 2.5, Mg: 2.3 No replacement needed at this time.  Pharmacy will continue to monitor labs and replace electrolytes as needed.   Pernell Dupre, PharmD Clinical Pharmacist 12/16/2017 7:19 AM

## 2017-12-16 NOTE — Progress Notes (Addendum)
[facility] [place] NAME: Stephen [name]    MR#:  [id number]  DATE OF BIRTH:  [birth year]  SUBJECTIVE:  Patient with fever this am Received Ativan last night for agitation  REVIEW OF SYSTEMS:    Unable to obtain patient lethargy   Tolerating Diet: yes      DRUG ALLERGIES:  No Known Allergies  VITALS:  Blood pressure (!) 171/83, pulse (!) 129, temperature (!) 100.9 F (38.3 C), temperature source Axillary, resp. rate 18, height 5\' 9"  (1.753 m), weight 40.8 kg, SpO2 95 %.  PHYSICAL EXAMINATION:  Constitutional: Appears frail a bit lethargic due to the Ativan that was given HENT: Normocephalic. Marland Kitchen Oropharynx is clear and moist.  Eyes: Conjunctivae and EOM are normal. PERRLA, no scleral icterus.  Neck: Normal ROM. Neck supple. No JVD. No tracheal deviation. CVS: Tachycardic S1/S2 +, no murmurs, no gallops, no carotid bruit.  Pulmonary: Effort and breath sounds normal, no stridor, rhonchi, wheezes, rales.  Abdominal: Soft. BS +,  no distension, tenderness, rebound or guarding.  Musculoskeletal: Normal range of motion. No edema and no tenderness.  Neuro: He opens his eyes but does not follow commands he received Ativan.  No obvious focal deficits   skin: Skin is warm and dry. No rash noted. Psychiatric: A bit lethargic    LABORATORY PANEL:   CBC Recent Labs  Lab 12/16/17 0429  WBC 19.0*  HGB 9.0*  HCT 28.0*  PLT 325   ------------------------------------------------------------------------------------------------------------------  Chemistries  Recent Labs  Lab 12/14/17 1045  12/16/17 0429  NA 139   < > 134*  K 3.1*   < > 4.0  CL 100   < > 99  CO2 27   < > 29  GLUCOSE 88   < > 84  BUN 11   < > 14  CREATININE 0.88   < > 0.85  CALCIUM 8.2*   < > 8.0*  MG 1.4*  --  2.3  AST 23  --   --   ALT 9  --   --   ALKPHOS 197*  --   --   BILITOT 0.8  --   --    < > = values in this interval not displayed.    ------------------------------------------------------------------------------------------------------------------  Cardiac Enzymes Recent Labs  Lab 12/14/17 1045  TROPONINI 0.03*   ------------------------------------------------------------------------------------------------------------------  RADIOLOGY:  Dg Eye Foreign Body  Result Date: 12/15/2017 CLINICAL DATA:  History of metal removed from eye. EXAM: ORBITS FOR FOREIGN BODY - 2 VIEW COMPARISON:  None. FINDINGS: There is no evidence of metallic foreign body within the orbits. No significant bone abnormality identified. IMPRESSION: No evidence of metallic foreign body within the orbits. Electronically Signed   By: Lucienne Capers M.D.   On: 12/15/2017 03:07   Dg Thoracic Spine 2 View  Result Date: 12/14/2017 CLINICAL DATA:  Back pain EXAM: THORACIC SPINE 2 VIEWS COMPARISON:  None. FINDINGS: Normal alignment. Negative for thoracic fracture or mass. Sclerotic L1 vertebral body as described on the lumbar spine examination. Mild disc degeneration at multiple levels in the thoracic spine. IMPRESSION: Mild thoracic disc degeneration.  Negative for fracture or mass. Electronically Signed   By: Franchot Gallo M.D.   On: 12/14/2017 15:15   Dg Lumbar Spine 2-3 Views  Result Date: 12/14/2017 CLINICAL DATA:  Low back pain.  Fever and leukocytosis. EXAM: LUMBAR SPINE - 2-3 VIEW COMPARISON:  CT abdomen pelvis 12/14/2014 FINDINGS: Sclerotic L1 vertebral body without fracture Sclerotic L4 vertebral  body with moderate compression fracture. No fracture on the prior CT. Fracture age is indeterminate. Mild retrolisthesis L3-4. Multilevel disc degeneration with disc space narrowing and spurring L1 through S1 of a moderate degree. Atherosclerotic calcification in the aorta and iliac arteries. IMPRESSION: Sclerotic L1 vertebral body without fracture. Moderate compression fracture L4 with sclerotic vertebral body. Findings are worrisome for metastatic disease.  Possible prostate cancer. Recommend MRI lumbar spine without with contrast. If the patient cannot have MRI, CT lumbar spine could be performed Multilevel disc degeneration and spondylosis throughout the lumbar spine. Electronically Signed   By: Franchot Gallo M.D.   On: 12/14/2017 15:14   Mr Lumbar Spine W Wo Contrast  Result Date: 12/15/2017 CLINICAL DATA:  Back pain.  Abnormal x-ray EXAM: MRI LUMBAR SPINE WITHOUT AND WITH CONTRAST TECHNIQUE: Multiplanar and multiecho pulse sequences of the lumbar spine were obtained without and with intravenous contrast. CONTRAST:  75mL MULTIHANCE GADOBENATE DIMEGLUMINE 529 MG/ML IV SOLN COMPARISON:  Lumbar spine radiographs 12/14/2017 FINDINGS: Segmentation:  Normal Alignment:  Mild retrolisthesis L4-5 Vertebrae: Multiple areas of abnormal bone signal throughout the lumbar spine, highly suspicious for metastatic disease. Heterogeneous bone marrow T12 vertebral body. Sclerotic lesion at L1 is noted on x-ray. Decreased signal on T1 and T2 throughout the L1 vertebral body extending into the posterior elements. Pathologic fracture of L4 with abnormal bone marrow diffusely throughout the vertebral body and spinous process compatible with metastatic disease. 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 Paraspinous soft tissue thickening due to extraosseous tumor extension surrounding the vertebral body. Abnormal bone marrow S1 vertebral body also consistent with metastatic disease. Small lesion left posterior iliac bone adjacent to the SI joint also compatible with tumor. Conus medullaris and cauda equina: Conus extends to the L1-2 level. Conus and cauda equina appear normal. Paraspinal and other soft tissues: Paraspinous soft tissue surrounding the L4 vertebral body consistent with extraosseous tumor. Enhancing mass lateral to the neural foramina on the left at L3-4 with enhancing mass in the left psoas muscle compatible with tumor. Left periaortic lymph node 2 cm,  probable malignant adenopathy. Disc levels: T12-L1: Negative for stenosis L1-2: Mild disc degeneration without stenosis L2-3: Moderate disc degeneration with disc bulging and spurring right greater than left. 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 Moderate foraminal stenosis bilaterally L4-5: Central disc protrusion. Disc bulging and facet hypertrophy with moderate to severe spinal stenosis L5-S1: Moderate disc degeneration and spondylosis. Moderate to advanced subarticular stenosis bilaterally. IMPRESSION: Pathologic compression fracture of L4 with ventral epidural tumor and severe spinal stenosis. 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 Left periaortic lymph node enlargement. Numerous additional lesions in the lumbar spine. Findings are consistent with metastatic disease. Favor prostate cancer given sclerotic appearance on x-ray. Recommend correlation with PSA and rectal exam. Mass lesion extends into the left psoas muscle. There is central nonenhancing necrosis in the left psoas muscle most likely due to tumor. Psoas abscess could have a similar appearance. Advanced multilevel degenerative change throughout the lumbar spine as above. Electronically Signed   By: Franchot Gallo M.D.   On: 12/15/2017 07:22   Dg Chest Port 1 View  Result Date: 12/14/2017 CLINICAL DATA:  Fever EXAM: PORTABLE CHEST 1 VIEW COMPARISON:  None. FINDINGS: The cardiac silhouette is normal in size and configuration. No mediastinal or hilar masses. There is no evidence of adenopathy. There are thickened bronchovascular markings  bilaterally, which are most likely chronic. No evidence of pneumonia or pulmonary edema. No pleural effusion or pneumothorax. Skeletal structures are demineralized but grossly intact. IMPRESSION: No acute cardiopulmonary disease. Electronically Signed    By: Lajean Manes M.D.   On: 12/14/2017 12:14     ASSESSMENT AND PLAN:   72 y/o male with hx of diabetes who presents to ED with back pain. 1. Sepsis: Possibly from PSOAS abscess seen on MRI?  CXR was negative and UA was negative Continue VANC and ZOSYN Zosyn was DC'd by ID yesterday however patient still continues to have a fever and therefore I have asked pharmacist to restart this. ID consult is recommending biopsy of this lesion     2.  Back pain: MRI concerning for possible metastatic disease.  PSA is within normal limits. Awaiting oncology evaluation  Patient has right axillary lymph node which may also be biopsied    3.  Hypokalemia: Pharmacy to replete  4.  Anemia of chronic disease: Iron study shows severe iron deficiency anemia. Oncology evaluation for possible IV iron transfusion  5.  Diabetes: Continue sliding scale  6.  Severe protein calorie malnutrition on nutritional supplements   Management plans discussed with the patient's daughter and she is in agreement.   CODE STATUS: Full  TOTAL TIME TAKING CARE OF THIS PATIENT: 27 minutes.     POSSIBLE D/C 4-5 days, DEPENDING ON CLINICAL CONDITION.   Chaysen Tillman M.D on 12/16/2017 at 10:10 AM  Between 7am to 6pm - Pager - [phone] After 6pm go to [email or web address] - password [email or web address] [facility]  Office  [phone]  CC: Primary care physician; Glean Hess, MD  Note: This dictation was prepared with Dragon dictation along with smaller phrase technology. Any transcriptional errors that result from this process are unintentional.

## 2017-12-17 ENCOUNTER — Other Ambulatory Visit: Payer: Self-pay | Admitting: Internal Medicine

## 2017-12-17 DIAGNOSIS — M5442 Lumbago with sciatica, left side: Secondary | ICD-10-CM

## 2017-12-17 DIAGNOSIS — L8990 Pressure ulcer of unspecified site, unspecified stage: Secondary | ICD-10-CM

## 2017-12-17 LAB — HIV ANTIBODY (ROUTINE TESTING W REFLEX): HIV Screen 4th Generation wRfx: NONREACTIVE

## 2017-12-17 LAB — BASIC METABOLIC PANEL
Anion gap: 6 (ref 5–15)
BUN: 11 mg/dL (ref 8–23)
CALCIUM: 8.2 mg/dL — AB (ref 8.9–10.3)
CHLORIDE: 98 mmol/L (ref 98–111)
CO2: 29 mmol/L (ref 22–32)
CREATININE: 0.86 mg/dL (ref 0.61–1.24)
GFR calc Af Amer: >60 mL/min (ref >60)
GFR calc non Af Amer: >60 mL/min (ref >60)
Glucose, Bld: 105 mg/dL — ABNORMAL HIGH (ref 70–99)
Potassium: 4.4 mmol/L (ref 3.5–5.1)
SODIUM: 133 mmol/L — AB (ref 135–145)

## 2017-12-17 LAB — CBC
HCT: 29.1 % — ABNORMAL LOW (ref 40.0–52.0)
HEMOGLOBIN: 9.8 g/dL — AB (ref 13.0–18.0)
MCH: 27.1 pg (ref 26.0–34.0)
MCHC: 33.8 g/dL (ref 32.0–36.0)
MCV: 80.2 fL (ref 80.0–100.0)
Platelets: 324 10*3/uL (ref 150–440)
RBC: 3.63 MIL/uL — AB (ref 4.40–5.90)
RDW: 16.2 % — AB (ref 11.5–14.5)
WBC: 19.7 10*3/uL — AB (ref 3.8–10.6)

## 2017-12-17 LAB — GLUCOSE, CAPILLARY
GLUCOSE-CAPILLARY: 82 mg/dL (ref 70–99)
GLUCOSE-CAPILLARY: 214 mg/dL — AB (ref 70–99)
Glucose-Capillary: 131 mg/dL — ABNORMAL HIGH (ref 70–99)
Glucose-Capillary: 94 mg/dL (ref 70–99)

## 2017-12-17 LAB — PSA: Prostatic Specific Antigen: 4.22 ng/mL — ABNORMAL HIGH (ref 0.00–4.00)

## 2017-12-17 LAB — VANCOMYCIN, TROUGH: VANCOMYCIN TR: 12 ug/mL — AB (ref 15–20)

## 2017-12-17 LAB — CEA: CEA: 4.3 ng/mL (ref 0.0–4.7)

## 2017-12-17 MED ORDER — MORPHINE SULFATE (PF) 2 MG/ML IV SOLN
2.0000 mg | INTRAVENOUS | Status: DC | PRN
  Administered 2017-12-17 – 2017-12-27 (×16): 2 mg via INTRAVENOUS
  Filled 2017-12-17 (×18): qty 1

## 2017-12-17 MED ORDER — SODIUM CHLORIDE 0.9 % IV SOLN
2.0000 g | Freq: Two times a day (BID) | INTRAVENOUS | Status: DC
  Administered 2017-12-17 – 2017-12-18 (×4): 2 g via INTRAVENOUS
  Filled 2017-12-17 (×6): qty 2

## 2017-12-17 MED ORDER — VANCOMYCIN HCL IN DEXTROSE 1-5 GM/200ML-% IV SOLN
1000.0000 mg | INTRAVENOUS | Status: DC
  Administered 2017-12-17 – 2017-12-19 (×3): 1000 mg via INTRAVENOUS
  Filled 2017-12-17 (×3): qty 200

## 2017-12-17 MED ORDER — IBUPROFEN 400 MG PO TABS
400.0000 mg | ORAL_TABLET | ORAL | Status: AC
  Administered 2017-12-17: 400 mg via ORAL
  Filled 2017-12-17: qty 1

## 2017-12-17 NOTE — Progress Notes (Signed)
Pharmacy Antibiotic Note  Stephen [name]. is a 72 y.o. male admitted on 12/14/2017 with sepsis.  Pharmacy has been consulted for vancomycin and Zosyn dosing. PMH includes COPD, DM, psoriasis. He presented for diffuse back spasms, fever and was found to be hypoxic with generalized weakness and leukocytosis without obvious source other than possibly his bedsores. CXR did not reveal any acute process.   Zosyn discontinued by ID on 9/5, however Dr. Benjie Karvonen would like to restart it due to patient still  having a fever.   Plan: K: 0.042, T1/2: 16.5h, Vd: 30L  Continue Vancomycin 750mg  IV every 24 hours. Calculated trough at  Css: 17.3 mcg/mL  Goal trough 15-20 mcg/mL. Vanc trough ordered prior to the 4th dose.   9/7 0200 vanc level 12. Changed to 1 gram q 24 hours. Level before 3rd new dose.  Restart Zosyn 3.375 IV EI every 8 hours.   Height: 5\' 9"  (175.3 cm) Weight: 90 lb (40.8 kg) IBW/kg (Calculated) : 70.7  Temp (24hrs), Avg:99.9 F (37.7 C), Min:98.4 F (36.9 C), Max:101.7 F (38.7 C)  Recent Labs  Lab 12/14/17 1045 12/15/17 0425 12/16/17 0429 12/17/17 0143  WBC 22.5* 16.2* 19.0* 19.7*  CREATININE 0.88 0.75 0.85 0.86  LATICACIDVEN 1.3  --   --   --   VANCOTROUGH  --   --   --  12*    Estimated Creatinine Clearance: 44.1 mL/min (by C-G formula based on SCr of 0.86 mg/dL).    No Known Allergies  Antimicrobials this admission: Vancomycin 9/4 >>  Zosyn 9/4 >>    Microbiology results: 9/4 BCx: NG x 2 days  9/4 UCx: Insignificant growth  9/5 Aerobic Cx: Moderate GPC  9/5 MRSA PCR (-)   Thank you for allowing pharmacy to be a part of this patient's care.  Eloise Harman, PharmD, BCPS Clinical Pharmacist 12/17/2017 2:16 AM

## 2017-12-17 NOTE — Progress Notes (Signed)
Pharmacy Antibiotic Note  Karon Heckendorn. is a 72 y.o. male admitted on 12/14/2017 with sepsis.  Pharmacy has been consulted for cefepime dosing. Zosyn changed to cefepime.  Plan: Cefepime 2 g IV q12h  Height: 5\' 9"  (175.3 cm) Weight: 90 lb (40.8 kg) IBW/kg (Calculated) : 70.7  Temp (24hrs), Avg:99.9 F (37.7 C), Min:98.4 F (36.9 C), Max:101.7 F (38.7 C)  Recent Labs  Lab 12/14/17 1045 12/15/17 0425 12/16/17 0429 12/17/17 0143  WBC 22.5* 16.2* 19.0* 19.7*  CREATININE 0.88 0.75 0.85 0.86  LATICACIDVEN 1.3  --   --   --   VANCOTROUGH  --   --   --  12*    Estimated Creatinine Clearance: 44.1 mL/min (by C-G formula based on SCr of 0.86 mg/dL).    No Known Allergies    Thank you for allowing pharmacy to be a part of this patient's care.  Rocky Morel 12/17/2017 8:45 AM

## 2017-12-17 NOTE — Progress Notes (Signed)
[facility] [place] NAME: Stephen [name]    MR#:  [id number]  DATE OF BIRTH:  [birth year]  SUBJECTIVE:  CHIEF COMPLAINT:   Chief Complaint  Patient presents with  . Weakness   -Very emaciated appearing.  Complains of significant low back pain.  REVIEW OF SYSTEMS:  Review of Systems  Constitutional: Positive for malaise/fatigue and weight loss. Negative for chills and fever.  HENT: Negative for congestion, ear discharge, hearing loss and nosebleeds.   Eyes: Negative for blurred vision and double vision.  Respiratory: Negative for cough, shortness of breath and wheezing.   Cardiovascular: Negative for chest pain and palpitations.  Gastrointestinal: Negative for abdominal pain, constipation, diarrhea, nausea and vomiting.  Genitourinary: Negative for dysuria.  Musculoskeletal: Positive for joint pain and myalgias.  Neurological: Negative for dizziness, focal weakness, seizures, weakness and headaches.  Psychiatric/Behavioral: Negative for depression.    DRUG ALLERGIES:  No Known Allergies  VITALS:  Blood pressure 125/72, pulse 84, temperature 99.5 F (37.5 C), temperature source Oral, resp. rate 16, height 5\' 9"  (1.753 m), weight 40.8 kg, SpO2 98 %.  PHYSICAL EXAMINATION:  Physical Exam  GENERAL:  72 y.o.-year-old thin built and ill nourished patient lying in the bed, complaining of low back pain.  EYES: Pupils equal, round, reactive to light and accommodation. No scleral icterus. Extraocular muscles intact.  HEENT: Head atraumatic, normocephalic. Oropharynx and nasopharynx clear.  NECK:  Supple, no jugular venous distention. No thyroid enlargement, no tenderness.  LUNGS: Normal breath sounds bilaterally, no wheezing, rales,rhonchi or crepitation. No use of accessory muscles of respiration.  Decreased bibasilar breath sounds. CARDIOVASCULAR: S1, S2 normal. No  rubs, or gallops.  2/6 systolic murmur is present ABDOMEN: Soft,  nontender, nondistended. Bowel sounds present. No organomegaly or mass.  EXTREMITIES: Significantly atrophied lower extremities.  No pedal edema, cyanosis, or clubbing.  NEUROLOGIC: Cranial nerves II through XII are intact. Muscle strength 5/5 in all extremities. Sensation intact. Gait not checked.  Global weakness noted PSYCHIATRIC: The patient is alert and oriented x 3.  SKIN: No obvious rash, lesion, or ulcer.    LABORATORY PANEL:   CBC Recent Labs  Lab 12/17/17 0143  WBC 19.7*  HGB 9.8*  HCT 29.1*  PLT 324   ------------------------------------------------------------------------------------------------------------------  Chemistries  Recent Labs  Lab 12/14/17 1045  12/16/17 0429 12/17/17 0143  NA 139   < > 134* 133*  K 3.1*   < > 4.0 4.4  CL 100   < > 99 98  CO2 27   < > 29 29  GLUCOSE 88   < > 84 105*  BUN 11   < > 14 11  CREATININE 0.88   < > 0.85 0.86  CALCIUM 8.2*   < > 8.0* 8.2*  MG 1.4*  --  2.3  --   AST 23  --   --   --   ALT 9  --   --   --   ALKPHOS 197*  --   --   --   BILITOT 0.8  --   --   --    < > = values in this interval not displayed.   ------------------------------------------------------------------------------------------------------------------  Cardiac Enzymes Recent Labs  Lab 12/14/17 1045  TROPONINI 0.03*   ------------------------------------------------------------------------------------------------------------------  RADIOLOGY:  Ct Chest W Contrast  Result Date: 12/16/2017 CLINICAL DATA:  F/u to MR done yesterday with concern for metastatic disease. Pt unable to follow direction due to confusion. Difficulty holding still  and laying on back. Brought in by EMS with generalized weakness. 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 He says vodka was his drink and he was an alcoholic.He has had baseline cough, poor appetite EXAM: CT CHEST,  ABDOMEN, AND PELVIS WITH CONTRAST TECHNIQUE: Multidetector CT imaging of the chest, abdomen and pelvis was performed following the standard protocol during bolus administration of intravenous contrast. CONTRAST:  31mL OMNIPAQUE IOHEXOL 300 MG/ML  SOLN COMPARISON:  Abdomen pelvis CT, 12/14/2014. Previous day's lumbar spine MRI. FINDINGS: CT CHEST FINDINGS Cardiovascular: Heart is top-normal in size. No pericardial effusion. Mild three-vessel coronary artery calcifications. Aorta is normal in caliber. No dissection. No significant atherosclerosis. Main pulmonary artery is mildly dilated. Right pulmonary artery measures 3 cm in diameter, left 2.8 cm. Mediastinum/Nodes: Several prominent mediastinal lymph nodes. There are 2 right paratracheal nodes measuring 1 cm short axis. No mediastinal masses. No hilar masses or adenopathy. Trachea is normal in caliber and widely patent. Esophagus is unremarkable. Lungs/Pleura: Moderate centrilobular emphysema. Dependent opacity in the lower lobes consistent with subsegmental atelectasis. Small calcified granuloma in the subpleural, inferior, posterior right lower lobe. Trace left pleural effusion. Atelectasis or scarring at the posterior base of the left upper lobe lingula and anteromedial right middle lobe. No convincing pneumonia. No pulmonary edema. No lung mass or suspicious nodule. No pneumothorax. Chest wall and soft tissues: There are prominent and enlarged axillary lymph nodes, most evident on the right. Largest node measures 4.4 x 1.8 cm transversely. CT ABDOMEN PELVIS FINDINGS Hepatobiliary: Liver normal in overall size. There are subtle, ill-defined hypoattenuating liver lesions, the most discrete noted at the dome of segment 7, measuring 11 mm. These are new since the prior CT suspicious for metastatic disease. Gallbladder is distended. There small dependent stones. No wall thickening or adjacent inflammation. No bile duct dilation. Pancreas: Atrophic with dystrophic  calcifications throughout the tail consistent with chronic pancreatitis. No mass or active inflammation. Spleen: Normal in size without focal abnormality. Adrenals/Urinary Tract: No adrenal masses. Kidneys normal size, orientation and position. No masses or stones. No hydronephrosis. Ureters not well visualized but are normal in overall caliber and course. No convincing ureteral stone. Bladder is unremarkable. Stomach/Bowel: Stomach is unremarkable. No bowel obstruction or inflammation. Mild to moderate increased stool most evident in the right colon with mild distention of the right colon, maximum diameter, 6.4 cm. Appendix not defined on this exam. No evidence of appendicitis. Vascular/Lymphatic: Abdominal aorta is tortuous with atherosclerotic disease. No aneurysm or dissection. There are several prominent to mildly enlarged lymph nodes. There is a left periaortic shotty node at the level of the left renal vein measuring 1.5 cm in short axis. There are prominent inguinal lymph nodes, largest on the left measuring 12 mm in short axis. Reproductive: Unremarkable. Other: There is decreased attenuation and relative enlargement of the left psoas muscle hazy adjacent inflammation or edema. Trace amount of ascites in the pelvis and adjacent to the liver. MUSCULOSKELETAL FINDINGS Pathologic fracture of L4 as noted on the previous day's lumbar MRI. There are multiple areas of ill-defined sclerosis throughout the spine, also noted in the sternum. No other fractures. IMPRESSION: 1. 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 The primary malignancy is unclear.  There are no lung masses. 2. No acute findings in the chest, abdomen and pelvis. 3. Moderate emphysema. Mild enlargement of the pulmonary arteries.  Consider pulmonary hypertension in the proper clinical setting. 4. Mild coronary artery calcifications. 5. Distended gallbladder with gallstones but no findings of acute cholecystitis. 6. Chronic pancreatitis. 7. Edema of the left psoas muscle is likely reactive to the pathologic L4 fracture. 8. Nonspecific trace ascites. 9. Aortic atherosclerosis. Electronically Signed   By: Lajean Manes M.D.   On: 12/16/2017 13:17   Ct Abdomen Pelvis W Contrast  Result Date: 12/16/2017 CLINICAL DATA:  F/u to MR done yesterday with concern for metastatic disease. Pt unable to follow direction due to confusion. Difficulty holding still and laying on back. Brought in by EMS with generalized weakness. 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 He says vodka was his drink and he was an alcoholic.He has had baseline cough, poor appetite EXAM: CT CHEST, ABDOMEN, AND PELVIS WITH CONTRAST TECHNIQUE: Multidetector CT imaging of the chest, abdomen and pelvis was performed following the standard protocol during bolus administration of intravenous contrast. CONTRAST:  39mL OMNIPAQUE IOHEXOL 300 MG/ML  SOLN COMPARISON:  Abdomen pelvis CT, 12/14/2014. Previous day's lumbar spine MRI. FINDINGS: CT CHEST FINDINGS Cardiovascular: Heart is top-normal in size. No pericardial effusion. Mild three-vessel coronary artery calcifications. Aorta is normal in caliber. No dissection. No significant atherosclerosis. Main pulmonary artery is mildly dilated. Right pulmonary artery measures 3 cm in diameter, left 2.8 cm. Mediastinum/Nodes: Several prominent mediastinal lymph nodes. There are 2 right paratracheal nodes measuring 1 cm short axis. No mediastinal masses. No hilar masses or adenopathy. Trachea is normal in caliber and widely patent. Esophagus is unremarkable. Lungs/Pleura: Moderate centrilobular emphysema. Dependent opacity in the lower lobes  consistent with subsegmental atelectasis. Small calcified granuloma in the subpleural, inferior, posterior right lower lobe. Trace left pleural effusion. Atelectasis or scarring at the posterior base of the left upper lobe lingula and anteromedial right middle lobe. No convincing pneumonia. No pulmonary edema. No lung mass or suspicious nodule. No pneumothorax. Chest wall and soft tissues: There are prominent and enlarged axillary lymph nodes, most evident on the right. Largest node measures 4.4 x 1.8 cm transversely. CT ABDOMEN PELVIS FINDINGS Hepatobiliary: Liver normal in overall size. There are subtle, ill-defined hypoattenuating liver lesions, the most discrete noted at the dome of segment 7, measuring 11 mm. These are new since the prior CT suspicious for metastatic disease. Gallbladder is distended. There small dependent stones. No wall thickening or adjacent inflammation. No bile duct dilation. Pancreas: Atrophic with dystrophic calcifications throughout the tail consistent with chronic pancreatitis. No mass or active inflammation. Spleen: Normal in size without focal abnormality. Adrenals/Urinary Tract: No adrenal masses. Kidneys normal size, orientation and position. No masses or stones. No hydronephrosis. Ureters not well visualized but are normal in overall caliber and course. No convincing ureteral stone. Bladder is unremarkable. Stomach/Bowel: Stomach is unremarkable. No bowel obstruction or inflammation. Mild to moderate increased stool most evident in the right colon with mild distention of the right colon, maximum diameter, 6.4 cm. Appendix not defined on this exam. No evidence of appendicitis. Vascular/Lymphatic: Abdominal aorta is tortuous with atherosclerotic disease. No aneurysm or dissection. There are several prominent to mildly enlarged lymph nodes. There is a left periaortic shotty node at the level of the left renal vein measuring 1.5 cm in short axis. There  are prominent inguinal lymph  nodes, largest on the left measuring 12 mm in short axis. Reproductive: Unremarkable. Other: There is decreased attenuation and relative enlargement of the left psoas muscle hazy adjacent inflammation or edema. Trace amount of ascites in the pelvis and adjacent to the liver. MUSCULOSKELETAL FINDINGS Pathologic fracture of L4 as noted on the previous day's lumbar MRI. There are multiple areas of ill-defined sclerosis throughout the spine, also noted in the sternum. No other fractures. IMPRESSION: 1. 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 The primary malignancy is unclear. There are no lung masses. 2. No acute findings in the chest, abdomen and pelvis. 3. Moderate emphysema. Mild enlargement of the pulmonary arteries. Consider pulmonary hypertension in the proper clinical setting. 4. Mild coronary artery calcifications. 5. Distended gallbladder with gallstones but no findings of acute cholecystitis. 6. Chronic pancreatitis. 7. Edema of the left psoas muscle is likely reactive to the pathologic L4 fracture. 8. Nonspecific trace ascites. 9. Aortic atherosclerosis. Electronically Signed   By: Lajean Manes M.D.   On: 12/16/2017 13:17    EKG:   Orders placed or performed during the hospital encounter of 12/14/17  . ED EKG 12-Lead  . ED EKG 12-Lead  . EKG 12-Lead  . EKG 12-Lead    ASSESSMENT AND PLAN:   72 year old male with past medical history significant for bipolar/depression, diabetes and psoriasis was brought in secondary to generalized weakness and weight loss.  1.  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 -Pathologic compression fracture of L4 noted. -Pain medications for now. -Physical therapy as  tolerated  2.  Left psoas muscle edema-with fevers, abscess could not be ruled out. -Appreciate ID consult.  Continue vancomycin and cefepime for now. -Fevers could also be coming from underlying cancer.  3.  Metastatic disease of unknown primary-appreciate oncology consult. -CT abdomen and pelvis showing only spinal disease. -LDH is elevated.  Check PSA  4.  DVT prophylaxis-Lovenox.  We will consult palliative care as patient does not have any good support, very emaciated and malnourished at this time.  Not sure how he can withstand further treatments.  Requesting pain control to be comfortable.     All the records are reviewed and case discussed with Care Management/Social Workerr. Management plans discussed with the patient, family and they are in agreement.  CODE STATUS: Full code  TOTAL TIME TAKING CARE OF THIS PATIENT: 38 minutes.   POSSIBLE D/C IN ? DAYS, DEPENDING ON CLINICAL CONDITION.   Gladstone Lighter M.D on 12/17/2017 at 12:53 PM  Between 7am to 6pm - Pager - [phone]  After 6pm go to [email or web address] - password [email or web address] [facility]  Office  [phone]  CC: Primary care physician; Glean Hess, MD

## 2017-12-17 NOTE — Plan of Care (Signed)
  Problem: Health Behavior/Discharge Planning: Goal: Ability to manage health-related needs will improve Outcome: Not Progressing   Problem: Activity: Goal: Risk for activity intolerance will decrease Outcome: Not Progressing   Problem: Pain Managment: Goal: General experience of comfort will improve Outcome: Not Progressing   

## 2017-12-17 NOTE — Progress Notes (Addendum)
[facility] Hematology/Oncology Progress Note  Date of admission: 12/14/2017  Hospital day:  12/17/2017  Chief Complaint: Stephen Krolak. is a 72 y.o. male who was admitted through the emergency room with generalized weakness, leukocytosis, and concern for sepsis.  Subjective: Feels cold.  Back hurts.  "I have to pee".  Social History: The patient is alone today.  Allergies: No Known Allergies  Scheduled Medications: . diphenhydrAMINE  12.5 mg Intravenous Once  . enoxaparin (LOVENOX) injection  30 mg Subcutaneous Q24H  . feeding supplement (NEPRO CARB STEADY)  237 mL Oral BID BM  . insulin aspart  0-5 Units Subcutaneous QHS  . insulin aspart  0-9 Units Subcutaneous TID WC  . mouth rinse  15 mL Mouth Rinse BID  . multivitamin with minerals  1 tablet Oral Daily  . vitamin C  250 mg Oral BID    Review of Systems: GENERAL:  Feels cold.  No fevers, sweats.  [place] of weight loss. PERFORMANCE STATUS (ECOG):  2-3 HEENT:  No visual changes, runny nose, sore throat, mouth sores or tenderness. Lungs: No shortness of breath or cough.  No hemoptysis. Cardiac:  No chest pain, palpitations, orthopnea, or PND. GI:  No nausea, vomiting, diarrhea, constipation, melena or hematochezia. GU:  No urgency, frequency, dysuria, or hematuria. Musculoskeletal:  Back pain.  No muscle tenderness. Lymph nodes:  Patient aware of lumps under his right arm for awhile. Extremities:  No pain or swelling. Skin:  Psoriasis. Neuro:  Weak.  No headache, numbness or weakness, balance or coordination issues. Endocrine:  Diabetes.  No thyroid issues, hot flashes or night sweats. Psych:  No mood changes, depression or anxiety. Pain:  Severe back pain. Review of systems:  All other systems reviewed and found to be negative.  Physical Exam: Blood pressure (!) 173/81, pulse (!) 137, temperature (!) 103.3 F (39.6 C), temperature source Oral, resp. rate 18, height 5' 9"  (1.753 m), weight 90 lb (40.8  kg), SpO2 91 %.  GENERAL:  Chronically ill appearing thin gentleman lying curled up in bed with several blankets on top of him. MENTAL STATUS:  Alert and oriented to person and place. HEAD:  Male pattern baldness.  Gray beard.  Normocephalic, atraumatic, face symmetric, no Cushingoid features. EYES:  Brown eyes.  No conjunctivitis or scleral icterus. SKIN:  No rashes or lesions. EXTREMITIES: No edema, no skin discoloration.   NEUROLOGICAL: Awake and interactive. PSYCH:  Does not want to be examined.  Wants to stay under blanket with head covered.   Results for orders placed or performed during the hospital encounter of 12/14/17 (from the past 48 hour(s))  Glucose, capillary     Status: Abnormal   Collection Time: 12/15/17  4:41 PM  Result Value Ref Range   Glucose-Capillary 140 (H) 70 - 99 mg/dL   Comment 1 Notify RN   Aerobic Culture (superficial specimen)     Status: None (Preliminary result)   Collection Time: 12/15/17  5:40 PM  Result Value Ref Range   Specimen Description      SKIN Performed at [facility], [address], [place], [place] [address]    Special Requests      NONE Performed at [facility], [address], [place], [place] [address]    Gram Stain      ABUNDANT WBC PRESENT,BOTH PMN AND MONONUCLEAR MODERATE [place] Performed at [facility], [place] [address], [place],  [address]    Culture ABUNDANT STAPHYLOCOCCUS AUREUS  Report Status PENDING   Potassium     Status: None   Collection Time: 12/15/17  6:08 PM  Result Value Ref Range   Potassium 4.1 3.5 - 5.1 mmol/L    Comment: Performed at [facility], [address] [place], [place] [address]  Glucose, capillary     Status: Abnormal   Collection Time: 12/15/17  9:08 PM  Result Value Ref Range   Glucose-Capillary 177 (H) 70 - 99 mg/dL   Comment 1 Notify RN   Magnesium     Status: None   Collection Time: 12/16/17  4:29 AM  Result Value Ref Range    Magnesium 2.3 1.7 - 2.4 mg/dL    Comment: Performed at [facility], [address] [place], [place] [address]  CBC     Status: Abnormal   Collection Time: 12/16/17  4:29 AM  Result Value Ref Range   WBC 19.0 (H) 3.8 - 10.6 K/uL   RBC 3.43 (L) 4.40 - 5.90 MIL/uL   Hemoglobin 9.0 (L) 13.0 - 18.0 g/dL   HCT 28.0 (L) 40.0 - 52.0 %   MCV 81.6 80.0 - 100.0 fL   MCH 26.2 26.0 - 34.0 pg   MCHC 32.2 32.0 - 36.0 g/dL   RDW 16.6 (H) 11.5 - 14.5 %   Platelets 325 150 - 440 K/uL    Comment: Performed at [facility], [address] [place], [place] [address]  Basic metabolic panel     Status: Abnormal   Collection Time: 12/16/17  4:29 AM  Result Value Ref Range   Sodium 134 (L) 135 - 145 mmol/L   Potassium 4.0 3.5 - 5.1 mmol/L   Chloride 99 98 - 111 mmol/L   CO2 29 22 - 32 mmol/L   Glucose, Bld 84 70 - 99 mg/dL   BUN 14 8 - 23 mg/dL   Creatinine, Ser 0.85 0.61 - 1.24 mg/dL   Calcium 8.0 (L) 8.9 - 10.3 mg/dL   GFR calc non Af Amer >60 >60 mL/min   GFR calc Af Amer >60 >60 mL/min    Comment: (NOTE) The eGFR has been calculated using the CKD EPI equation. This calculation has not been validated in all clinical situations. eGFR's persistently <60 mL/min signify possible Chronic Kidney Disease.    Anion gap 6 5 - 15    Comment: Performed at [facility], [address] [place], [place] [address]  Phosphorus     Status: None   Collection Time: 12/16/17  4:29 AM  Result Value Ref Range   Phosphorus 2.5 2.5 - 4.6 mg/dL    Comment: Performed at [facility], [address] [place], [place] [address]  HIV antibody     Status: None   Collection Time: 12/16/17  4:29 AM  Result Value Ref Range   HIV Screen 4th Generation wRfx Non Reactive Non Reactive    Comment: (NOTE) Performed At: [facility] [place], [place] [id number] Rush Farmer MD [id number]   Protime-INR     Status: None   Collection Time: 12/16/17  4:29 AM   Result Value Ref Range   Prothrombin Time 15.2 11.4 - 15.2 seconds   INR 1.21     Comment: Performed at [facility], [address] [place], [place] [address]  APTT     Status: Abnormal   Collection Time: 12/16/17  4:29 AM  Result Value Ref Range   aPTT 50 (H) 24 - 36 seconds  Comment:        IF BASELINE aPTT IS ELEVATED, SUGGEST PATIENT RISK ASSESSMENT BE USED TO DETERMINE APPROPRIATE ANTICOAGULANT THERAPY. Performed at [facility], [address] [place], [place] [address]   Lactate dehydrogenase     Status: Abnormal   Collection Time: 12/16/17  4:29 AM  Result Value Ref Range   LDH 214 (H) 98 - 192 U/L    Comment: Performed at [facility], [address] [place], [place] [address]  Uric acid     Status: None   Collection Time: 12/16/17  4:29 AM  Result Value Ref Range   Uric Acid, Serum 4.2 3.7 - 8.6 mg/dL    Comment: Performed at [facility], [address] [place], [place] [address]  Glucose, capillary     Status: None   Collection Time: 12/16/17  8:32 AM  Result Value Ref Range   Glucose-Capillary 96 70 - 99 mg/dL  Glucose, capillary     Status: None   Collection Time: 12/16/17 12:48 PM  Result Value Ref Range   Glucose-Capillary 74 70 - 99 mg/dL   Comment 1 Notify RN   Glucose, capillary     Status: None   Collection Time: 12/16/17  5:18 PM  Result Value Ref Range   Glucose-Capillary 74 70 - 99 mg/dL   Comment 1 Notify RN   Glucose, capillary     Status: None   Collection Time: 12/16/17  9:18 PM  Result Value Ref Range   Glucose-Capillary 84 70 - 99 mg/dL  Vancomycin, trough     Status: Abnormal   Collection Time: 12/17/17  1:43 AM  Result Value Ref Range   Vancomycin Tr 12 (L) 15 - 20 ug/mL    Comment: Performed at [facility], [address] [place], [place] [address]  CBC     Status: Abnormal   Collection Time: 12/17/17  1:43 AM  Result Value Ref Range   WBC 19.7 (H) 3.8 - 10.6 K/uL   RBC  3.63 (L) 4.40 - 5.90 MIL/uL   Hemoglobin 9.8 (L) 13.0 - 18.0 g/dL   HCT 29.1 (L) 40.0 - 52.0 %   MCV 80.2 80.0 - 100.0 fL   MCH 27.1 26.0 - 34.0 pg   MCHC 33.8 32.0 - 36.0 g/dL   RDW 16.2 (H) 11.5 - 14.5 %   Platelets 324 150 - 440 K/uL    Comment: Performed at [facility], [address] [place],  [address]  Basic metabolic panel     Status: Abnormal   Collection Time: 12/17/17  1:43 AM  Result Value Ref Range   Sodium 133 (L) 135 - 145 mmol/L   Potassium 4.4 3.5 - 5.1 mmol/L   Chloride 98 98 - 111 mmol/L   CO2 29 22 - 32 mmol/L   Glucose, Bld 105 (H) 70 - 99 mg/dL   BUN 11 8 - 23 mg/dL   Creatinine, Ser 0.86 0.61 - 1.24 mg/dL   Calcium 8.2 (L) 8.9 - 10.3 mg/dL   GFR calc non Af Amer >60 >60 mL/min   GFR calc Af Amer >60 >60 mL/min    Comment: (NOTE) The eGFR has been calculated using the CKD EPI equation. This calculation has not been validated in all clinical situations. eGFR's persistently <60 mL/min signify possible Chronic Kidney Disease.    Anion gap 6 5 - 15    Comment: Performed at [facility], [address] [place],  [address]  Glucose,  capillary     Status: None   Collection Time: 12/17/17  9:09 AM  Result Value Ref Range   Glucose-Capillary 82 70 - 99 mg/dL  Glucose, capillary     Status: Abnormal   Collection Time: 12/17/17 11:28 AM  Result Value Ref Range   Glucose-Capillary 131 (H) 70 - 99 mg/dL   Ct Chest W Contrast  Result Date: 12/16/2017 CLINICAL DATA:  F/u to MR done yesterday with concern for metastatic disease. Pt unable to follow direction due to confusion. Difficulty holding still and laying on back. Brought in by EMS with generalized weakness. 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 He says vodka was his drink and he was an alcoholic.He has had baseline cough, poor appetite EXAM: CT CHEST, ABDOMEN, AND PELVIS WITH  CONTRAST TECHNIQUE: Multidetector CT imaging of the chest, abdomen and pelvis was performed following the standard protocol during bolus administration of intravenous contrast. CONTRAST:  33m OMNIPAQUE IOHEXOL 300 MG/ML  SOLN COMPARISON:  Abdomen pelvis CT, 12/14/2014. Previous day's lumbar spine MRI. FINDINGS: CT CHEST FINDINGS Cardiovascular: Heart is top-normal in size. No pericardial effusion. Mild three-vessel coronary artery calcifications. Aorta is normal in caliber. No dissection. No significant atherosclerosis. Main pulmonary artery is mildly dilated. Right pulmonary artery measures 3 cm in diameter, left 2.8 cm. Mediastinum/Nodes: Several prominent mediastinal lymph nodes. There are 2 right paratracheal nodes measuring 1 cm short axis. No mediastinal masses. No hilar masses or adenopathy. Trachea is normal in caliber and widely patent. Esophagus is unremarkable. Lungs/Pleura: Moderate centrilobular emphysema. Dependent opacity in the lower lobes consistent with subsegmental atelectasis. Small calcified granuloma in the subpleural, inferior, posterior right lower lobe. Trace left pleural effusion. Atelectasis or scarring at the posterior base of the left upper lobe lingula and anteromedial right middle lobe. No convincing pneumonia. No pulmonary edema. No lung mass or suspicious nodule. No pneumothorax. Chest wall and soft tissues: There are prominent and enlarged axillary lymph nodes, most evident on the right. Largest node measures 4.4 x 1.8 cm transversely. CT ABDOMEN PELVIS FINDINGS Hepatobiliary: Liver normal in overall size. There are subtle, ill-defined hypoattenuating liver lesions, the most discrete noted at the dome of segment 7, measuring 11 mm. These are new since the prior CT suspicious for metastatic disease. Gallbladder is distended. There small dependent stones. No wall thickening or adjacent inflammation. No bile duct dilation. Pancreas: Atrophic with dystrophic calcifications throughout  the tail consistent with chronic pancreatitis. No mass or active inflammation. Spleen: Normal in size without focal abnormality. Adrenals/Urinary Tract: No adrenal masses. Kidneys normal size, orientation and position. No masses or stones. No hydronephrosis. Ureters not well visualized but are normal in overall caliber and course. No convincing ureteral stone. Bladder is unremarkable. Stomach/Bowel: Stomach is unremarkable. No bowel obstruction or inflammation. Mild to moderate increased stool most evident in the right colon with mild distention of the right colon, maximum diameter, 6.4 cm. Appendix not defined on this exam. No evidence of appendicitis. Vascular/Lymphatic: Abdominal aorta is tortuous with atherosclerotic disease. No aneurysm or dissection. There are several prominent to mildly enlarged lymph nodes. There is a left periaortic shotty node at the level of the left renal vein measuring 1.5 cm in short axis. There are prominent inguinal lymph nodes, largest on the left measuring 12 mm in short axis. Reproductive: Unremarkable. Other: There is decreased attenuation and relative enlargement of the left psoas muscle  hazy adjacent inflammation or edema. Trace amount of ascites in the pelvis and adjacent to the liver. MUSCULOSKELETAL FINDINGS Pathologic fracture of L4 as noted on the previous day's lumbar MRI. There are multiple areas of ill-defined sclerosis throughout the spine, also noted in the sternum. No other fractures. IMPRESSION: 1. 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 The primary malignancy is unclear. There are no lung masses. 2. No acute findings in the chest, abdomen and pelvis. 3. Moderate emphysema. Mild enlargement of the pulmonary arteries. Consider pulmonary  hypertension in the proper clinical setting. 4. Mild coronary artery calcifications. 5. Distended gallbladder with gallstones but no findings of acute cholecystitis. 6. Chronic pancreatitis. 7. Edema of the left psoas muscle is likely reactive to the pathologic L4 fracture. 8. Nonspecific trace ascites. 9. Aortic atherosclerosis. Electronically Signed   By: Lajean Manes M.D.   On: 12/16/2017 13:17   Ct Abdomen Pelvis W Contrast  Result Date: 12/16/2017 CLINICAL DATA:  F/u to MR done yesterday with concern for metastatic disease. Pt unable to follow direction due to confusion. Difficulty holding still and laying on back. Brought in by EMS with generalized weakness. 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 He says vodka was his drink and he was an alcoholic.He has had baseline cough, poor appetite EXAM: CT CHEST, ABDOMEN, AND PELVIS WITH CONTRAST TECHNIQUE: Multidetector CT imaging of the chest, abdomen and pelvis was performed following the standard protocol during bolus administration of intravenous contrast. CONTRAST:  47m OMNIPAQUE IOHEXOL 300 MG/ML  SOLN COMPARISON:  Abdomen pelvis CT, 12/14/2014. Previous day's lumbar spine MRI. FINDINGS: CT CHEST FINDINGS Cardiovascular: Heart is top-normal in size. No pericardial effusion. Mild three-vessel coronary artery calcifications. Aorta is normal in caliber. No dissection. No significant atherosclerosis. Main pulmonary artery is mildly dilated. Right pulmonary artery measures 3 cm in diameter, left 2.8 cm. Mediastinum/Nodes: Several prominent mediastinal lymph nodes. There are 2 right paratracheal nodes measuring 1 cm short axis. No mediastinal masses. No hilar masses or adenopathy. Trachea is normal in caliber and widely patent. Esophagus is unremarkable. Lungs/Pleura: Moderate centrilobular emphysema. Dependent opacity in the lower lobes consistent with  subsegmental atelectasis. Small calcified granuloma in the subpleural, inferior, posterior right lower lobe. Trace left pleural effusion. Atelectasis or scarring at the posterior base of the left upper lobe lingula and anteromedial right middle lobe. No convincing pneumonia. No pulmonary edema. No lung mass or suspicious nodule. No pneumothorax. Chest wall and soft tissues: There are prominent and enlarged axillary lymph nodes, most evident on the right. Largest node measures 4.4 x 1.8 cm transversely. CT ABDOMEN PELVIS FINDINGS Hepatobiliary: Liver normal in overall size. There are subtle, ill-defined hypoattenuating liver lesions, the most discrete noted at the dome of segment 7, measuring 11 mm. These are new since the prior CT suspicious for metastatic disease. Gallbladder is distended. There small dependent stones. No wall thickening or adjacent inflammation. No bile duct dilation. Pancreas: Atrophic with dystrophic calcifications throughout the tail consistent with chronic pancreatitis. No mass or active inflammation. Spleen: Normal in size without focal abnormality. Adrenals/Urinary Tract: No adrenal masses. Kidneys normal size, orientation and position. No masses or stones. No hydronephrosis. Ureters not well visualized but are normal in overall caliber and  course. No convincing ureteral stone. Bladder is unremarkable. Stomach/Bowel: Stomach is unremarkable. No bowel obstruction or inflammation. Mild to moderate increased stool most evident in the right colon with mild distention of the right colon, maximum diameter, 6.4 cm. Appendix not defined on this exam. No evidence of appendicitis. Vascular/Lymphatic: Abdominal aorta is tortuous with atherosclerotic disease. No aneurysm or dissection. There are several prominent to mildly enlarged lymph nodes. There is a left periaortic shotty node at the level of the left renal vein measuring 1.5 cm in short axis. There are prominent inguinal lymph nodes, largest on  the left measuring 12 mm in short axis. Reproductive: Unremarkable. Other: There is decreased attenuation and relative enlargement of the left psoas muscle hazy adjacent inflammation or edema. Trace amount of ascites in the pelvis and adjacent to the liver. MUSCULOSKELETAL FINDINGS Pathologic fracture of L4 as noted on the previous day's lumbar MRI. There are multiple areas of ill-defined sclerosis throughout the spine, also noted in the sternum. No other fractures. IMPRESSION: 1. 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 The primary malignancy is unclear. There are no lung masses. 2. No acute findings in the chest, abdomen and pelvis. 3. Moderate emphysema. Mild enlargement of the pulmonary arteries. Consider pulmonary hypertension in the proper clinical setting. 4. Mild coronary artery calcifications. 5. Distended gallbladder with gallstones but no findings of acute cholecystitis. 6. Chronic pancreatitis. 7. Edema of the left psoas muscle is likely reactive to the pathologic L4 fracture. 8. Nonspecific trace ascites. 9. Aortic atherosclerosis. Electronically Signed   By: Lajean Manes M.D.   On: 12/16/2017 13:17    Assessment:  Stephen Pietila. is a 72 y.o. 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 He presented with failure to thrive.  He has lost a significant amount of weight.  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 There was left periaortic lymph node enlargement.  There were numerous additional lesions in the lumbar spine. Findings were consistent with metastatic disease. Mass lesion extended into the left  psoas muscle. There was central nonenhancing necrosis in the left psoas muscle most likely due to tumor. Psoas abscess could have a similar appearance.  Chest, abdomen, and pelvic CT on 12/16/2017 revealed ill-defined areas of sclerosis throughout the visualized spine and in the sternum, pathologic compression fracture of L4, adenopathy, most evident in the right axilla, and ill-defined liver lesions. There are no lung masses.    PSA was 3.65 on 12/14/2017.  LDH was 214 (slightly elevated).  Anemia workup included the following normal labs: ferritin (122), folate, and B12.  Iron saturation was 9% with a TIBC of 187. Retic was 1.7% (low).  Symptomatically, he notes back pain.  Exam was unable to be performed today.  Plan:   1.  Oncology:  Discuss with patient results of CT scan today.  Imaging personally reviewed.  Discuss diagnosis of cancer and need for biopsy.  Patient is agreeable to right axillary lymph node biopsy on Monday, 12/19/2017.  Discussed that he may require a lymph node excisional biopsy in the future if he is diagnosed with lymphoma.  Patient is agreeable to step-wise approach.  Discuss plan for treatment once a diagnosis is made.  Discuss radiation  oncology consult.  Discuss consideration of kyphoplasty at some point in the future for compression fracture of L4.   Lequita Asal, MD  12/17/2017, 4:18 PM

## 2017-12-17 NOTE — Consult Note (Signed)
[facility] [facility] for Electrolyte Monitoring and Replacement Indication: Hypokalemia, Hypomagnesemia    Labs: Recent Labs    12/14/17 1045 12/15/17 0425 12/16/17 0429 12/17/17 0143  WBC 22.5* 16.2* 19.0* 19.7*  HGB 9.8* 8.5* 9.0* 9.8*  HCT 30.4* 25.7* 28.0* 29.1*  PLT 374 314 325 324  APTT  --   --  50*  --   CREATININE 0.88 0.75 0.85 0.86  MG 1.4*  --  2.3  --   PHOS  --   --  2.5  --   ALBUMIN 2.7*  --   --   --   PROT 6.9  --   --   --   AST 23  --   --   --   ALT 9  --   --   --   ALKPHOS 197*  --   --   --   BILITOT 0.8  --   --   --    Potassium (mmol/L)  Date Value  12/17/2017 4.4   Magnesium (mg/dL)  Date Value  12/16/2017 2.3   Calcium (mg/dL)  Date Value  12/17/2017 8.2 (L)   Albumin (g/dL)  Date Value  12/14/2017 2.7 (L)  01/01/2016 3.8   Phosphorus (mg/dL)  Date Value  12/16/2017 2.5  ] Corrected Calcium: 8.64  Estimated Creatinine Clearance: 44.1 mL/min (by C-G formula based on SCr of 0.86 mg/dL).   Medical History: Past Medical History:  Diagnosis Date  . Diabetes mellitus without complication ([place])   . Major depressive disorder, single episode, unspecified 07/18/2017  . Psoriasis   . Skin abnormalities     Assessment: Pharmacy consulted for electrolyte monitoring and replacement in 72 yo male with hypokalemia and hypomagnesemia on admission.    Goal of Therapy:  Electrolytes WNL   Plan:   K: 4.4,Scr 0.86 No replacement needed at this time.  Will check labs on 9/9. Pharmacy will continue to monitor labs and replace electrolytes as needed.   Noralee Space, PharmD Clinical Pharmacist 12/17/2017 9:14 AM

## 2017-12-18 LAB — GLUCOSE, CAPILLARY
GLUCOSE-CAPILLARY: 65 mg/dL — AB (ref 70–99)
GLUCOSE-CAPILLARY: 145 mg/dL — AB (ref 70–99)
Glucose-Capillary: 92 mg/dL (ref 70–99)
Glucose-Capillary: 231 mg/dL — ABNORMAL HIGH (ref 70–99)
Glucose-Capillary: 100 mg/dL — ABNORMAL HIGH (ref 70–99)

## 2017-12-18 LAB — AEROBIC CULTURE W GRAM STAIN (SUPERFICIAL SPECIMEN)

## 2017-12-18 MED ORDER — IBUPROFEN 400 MG PO TABS
400.0000 mg | ORAL_TABLET | Freq: Four times a day (QID) | ORAL | Status: DC | PRN
  Administered 2017-12-18 – 2017-12-21 (×2): 400 mg via ORAL
  Filled 2017-12-18 (×3): qty 1

## 2017-12-18 NOTE — Progress Notes (Signed)
Temp elevated after tylenol given. MD paged. Waiting on callback.

## 2017-12-18 NOTE — Progress Notes (Signed)
[facility] NAME: Stephen [name]    MR#:  [id number]  DATE OF BIRTH:  1945/11/28  SUBJECTIVE:  CHIEF COMPLAINT:   Chief Complaint  Patient presents with  . Weakness   -Still has significant low back pain.  Slightly better than yesterday.  Very malnourished.  REVIEW OF SYSTEMS:  Review of Systems  Constitutional: Positive for malaise/fatigue and weight loss. Negative for chills and fever.  HENT: Negative for congestion, ear discharge, hearing loss and nosebleeds.   Eyes: Negative for blurred vision and double vision.  Respiratory: Negative for cough, shortness of breath and wheezing.   Cardiovascular: Negative for chest pain and palpitations.  Gastrointestinal: Negative for abdominal pain, constipation, diarrhea, nausea and vomiting.  Genitourinary: Negative for dysuria.  Musculoskeletal: Positive for joint pain and myalgias.  Neurological: Negative for dizziness, focal weakness, seizures, weakness and headaches.  Psychiatric/Behavioral: Negative for depression.    DRUG ALLERGIES:  No Known Allergies  VITALS:  Blood pressure (!) 118/58, pulse 74, temperature 97.9 F (36.6 C), temperature source Oral, resp. rate 18, height 5\' 9"  (1.753 m), weight 40.8 kg, SpO2 96 %.  PHYSICAL EXAMINATION:  Physical Exam  GENERAL:  72 y.o.-year-old thin built and ill nourished patient lying in the bed, complaining of low back pain.  EYES: Pupils equal, round, reactive to light and accommodation. No scleral icterus. Extraocular muscles intact.  HEENT: Head atraumatic, normocephalic. Oropharynx and nasopharynx clear.  NECK:  Supple, no jugular venous distention. No thyroid enlargement, no tenderness.  LUNGS: Normal breath sounds bilaterally, no wheezing, rales,rhonchi or crepitation. No use of accessory muscles of respiration.  Decreased bibasilar breath sounds. CARDIOVASCULAR: S1, S2 normal. No  rubs, or gallops.  2/6 systolic murmur is  present ABDOMEN: Soft, nontender, nondistended. Bowel sounds present. No organomegaly or mass.  EXTREMITIES: Significantly atrophied lower extremities.  No pedal edema, cyanosis, or clubbing.  NEUROLOGIC: Cranial nerves II through XII are intact. Muscle strength 5/5 in all extremities. Sensation intact. Gait not checked.  Global weakness noted PSYCHIATRIC: The patient is alert and oriented x 3.  SKIN: No obvious rash, lesion, or ulcer.    LABORATORY PANEL:   CBC Recent Labs  Lab 12/17/17 0143  WBC 19.7*  HGB 9.8*  HCT 29.1*  PLT 324   ------------------------------------------------------------------------------------------------------------------  Chemistries  Recent Labs  Lab 12/14/17 1045  12/16/17 0429 12/17/17 0143  NA 139   < > 134* 133*  K 3.1*   < > 4.0 4.4  CL 100   < > 99 98  CO2 27   < > 29 29  GLUCOSE 88   < > 84 105*  BUN 11   < > 14 11  CREATININE 0.88   < > 0.85 0.86  CALCIUM 8.2*   < > 8.0* 8.2*  MG 1.4*  --  2.3  --   AST 23  --   --   --   ALT 9  --   --   --   ALKPHOS 197*  --   --   --   BILITOT 0.8  --   --   --    < > = values in this interval not displayed.   ------------------------------------------------------------------------------------------------------------------  Cardiac Enzymes Recent Labs  Lab 12/14/17 1045  TROPONINI 0.03*   ------------------------------------------------------------------------------------------------------------------  RADIOLOGY:  Ct Chest W Contrast  Result Date: 12/16/2017 CLINICAL DATA:  F/u to MR done yesterday with concern for metastatic disease. Pt unable to follow direction due  to confusion. Difficulty holding still and laying on back. Brought in by EMS with generalized weakness. 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 He says vodka was his drink and he was an alcoholic.He has had baseline cough, poor  appetite EXAM: CT CHEST, ABDOMEN, AND PELVIS WITH CONTRAST TECHNIQUE: Multidetector CT imaging of the chest, abdomen and pelvis was performed following the standard protocol during bolus administration of intravenous contrast. CONTRAST:  38mL OMNIPAQUE IOHEXOL 300 MG/ML  SOLN COMPARISON:  Abdomen pelvis CT, 12/14/2014. Previous day's lumbar spine MRI. FINDINGS: CT CHEST FINDINGS Cardiovascular: Heart is top-normal in size. No pericardial effusion. Mild three-vessel coronary artery calcifications. Aorta is normal in caliber. No dissection. No significant atherosclerosis. Main pulmonary artery is mildly dilated. Right pulmonary artery measures 3 cm in diameter, left 2.8 cm. Mediastinum/Nodes: Several prominent mediastinal lymph nodes. There are 2 right paratracheal nodes measuring 1 cm short axis. No mediastinal masses. No hilar masses or adenopathy. Trachea is normal in caliber and widely patent. Esophagus is unremarkable. Lungs/Pleura: Moderate centrilobular emphysema. Dependent opacity in the lower lobes consistent with subsegmental atelectasis. Small calcified granuloma in the subpleural, inferior, posterior right lower lobe. Trace left pleural effusion. Atelectasis or scarring at the posterior base of the left upper lobe lingula and anteromedial right middle lobe. No convincing pneumonia. No pulmonary edema. No lung mass or suspicious nodule. No pneumothorax. Chest wall and soft tissues: There are prominent and enlarged axillary lymph nodes, most evident on the right. Largest node measures 4.4 x 1.8 cm transversely. CT ABDOMEN PELVIS FINDINGS Hepatobiliary: Liver normal in overall size. There are subtle, ill-defined hypoattenuating liver lesions, the most discrete noted at the dome of segment 7, measuring 11 mm. These are new since the prior CT suspicious for metastatic disease. Gallbladder is distended. There small dependent stones. No wall thickening or adjacent inflammation. No bile duct dilation. Pancreas:  Atrophic with dystrophic calcifications throughout the tail consistent with chronic pancreatitis. No mass or active inflammation. Spleen: Normal in size without focal abnormality. Adrenals/Urinary Tract: No adrenal masses. Kidneys normal size, orientation and position. No masses or stones. No hydronephrosis. Ureters not well visualized but are normal in overall caliber and course. No convincing ureteral stone. Bladder is unremarkable. Stomach/Bowel: Stomach is unremarkable. No bowel obstruction or inflammation. Mild to moderate increased stool most evident in the right colon with mild distention of the right colon, maximum diameter, 6.4 cm. Appendix not defined on this exam. No evidence of appendicitis. Vascular/Lymphatic: Abdominal aorta is tortuous with atherosclerotic disease. No aneurysm or dissection. There are several prominent to mildly enlarged lymph nodes. There is a left periaortic shotty node at the level of the left renal vein measuring 1.5 cm in short axis. There are prominent inguinal lymph nodes, largest on the left measuring 12 mm in short axis. Reproductive: Unremarkable. Other: There is decreased attenuation and relative enlargement of the left psoas muscle hazy adjacent inflammation or edema. Trace amount of ascites in the pelvis and adjacent to the liver. MUSCULOSKELETAL FINDINGS Pathologic fracture of L4 as noted on the previous day's lumbar MRI. There are multiple areas of ill-defined sclerosis throughout the spine, also noted in the sternum. No other fractures. IMPRESSION: 1. 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  The primary malignancy is unclear. There are no lung masses. 2. No acute findings in the chest, abdomen and pelvis. 3. Moderate emphysema. Mild enlargement of  the pulmonary arteries. Consider pulmonary hypertension in the proper clinical setting. 4. Mild coronary artery calcifications. 5. Distended gallbladder with gallstones but no findings of acute cholecystitis. 6. Chronic pancreatitis. 7. Edema of the left psoas muscle is likely reactive to the pathologic L4 fracture. 8. Nonspecific trace ascites. 9. Aortic atherosclerosis. Electronically Signed   By: Lajean Manes M.D.   On: 12/16/2017 13:17   Ct Abdomen Pelvis W Contrast  Result Date: 12/16/2017 CLINICAL DATA:  F/u to MR done yesterday with concern for metastatic disease. Pt unable to follow direction due to confusion. Difficulty holding still and laying on back. Brought in by EMS with generalized weakness. 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 He says vodka was his drink and he was an alcoholic.He has had baseline cough, poor appetite EXAM: CT CHEST, ABDOMEN, AND PELVIS WITH CONTRAST TECHNIQUE: Multidetector CT imaging of the chest, abdomen and pelvis was performed following the standard protocol during bolus administration of intravenous contrast. CONTRAST:  95mL OMNIPAQUE IOHEXOL 300 MG/ML  SOLN COMPARISON:  Abdomen pelvis CT, 12/14/2014. Previous day's lumbar spine MRI. FINDINGS: CT CHEST FINDINGS Cardiovascular: Heart is top-normal in size. No pericardial effusion. Mild three-vessel coronary artery calcifications. Aorta is normal in caliber. No dissection. No significant atherosclerosis. Main pulmonary artery is mildly dilated. Right pulmonary artery measures 3 cm in diameter, left 2.8 cm. Mediastinum/Nodes: Several prominent mediastinal lymph nodes. There are 2 right paratracheal nodes measuring 1 cm short axis. No mediastinal masses. No hilar masses or adenopathy. Trachea is normal in caliber and widely patent. Esophagus is unremarkable. Lungs/Pleura: Moderate centrilobular emphysema. Dependent opacity in  the lower lobes consistent with subsegmental atelectasis. Small calcified granuloma in the subpleural, inferior, posterior right lower lobe. Trace left pleural effusion. Atelectasis or scarring at the posterior base of the left upper lobe lingula and anteromedial right middle lobe. No convincing pneumonia. No pulmonary edema. No lung mass or suspicious nodule. No pneumothorax. Chest wall and soft tissues: There are prominent and enlarged axillary lymph nodes, most evident on the right. Largest node measures 4.4 x 1.8 cm transversely. CT ABDOMEN PELVIS FINDINGS Hepatobiliary: Liver normal in overall size. There are subtle, ill-defined hypoattenuating liver lesions, the most discrete noted at the dome of segment 7, measuring 11 mm. These are new since the prior CT suspicious for metastatic disease. Gallbladder is distended. There small dependent stones. No wall thickening or adjacent inflammation. No bile duct dilation. Pancreas: Atrophic with dystrophic calcifications throughout the tail consistent with chronic pancreatitis. No mass or active inflammation. Spleen: Normal in size without focal abnormality. Adrenals/Urinary Tract: No adrenal masses. Kidneys normal size, orientation and position. No masses or stones. No hydronephrosis. Ureters not well visualized but are normal in overall caliber and course. No convincing ureteral stone. Bladder is unremarkable. Stomach/Bowel: Stomach is unremarkable. No bowel obstruction or inflammation. Mild to moderate increased stool most evident in the right colon with mild distention of the right colon, maximum diameter, 6.4 cm. Appendix not defined on this exam. No evidence of appendicitis. Vascular/Lymphatic: Abdominal aorta is tortuous with atherosclerotic disease. No aneurysm or dissection. There are several prominent to mildly enlarged lymph nodes. There is a left periaortic shotty node at the level of the left renal vein measuring 1.5  cm in short axis. There are prominent  inguinal lymph nodes, largest on the left measuring 12 mm in short axis. Reproductive: Unremarkable. Other: There is decreased attenuation and relative enlargement of the left psoas muscle hazy adjacent inflammation or edema. Trace amount of ascites in the pelvis and adjacent to the liver. MUSCULOSKELETAL FINDINGS Pathologic fracture of L4 as noted on the previous day's lumbar MRI. There are multiple areas of ill-defined sclerosis throughout the spine, also noted in the sternum. No other fractures. IMPRESSION: 1. 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 The primary malignancy is unclear. There are no lung masses. 2. No acute findings in the chest, abdomen and pelvis. 3. Moderate emphysema. Mild enlargement of the pulmonary arteries. Consider pulmonary hypertension in the proper clinical setting. 4. Mild coronary artery calcifications. 5. Distended gallbladder with gallstones but no findings of acute cholecystitis. 6. Chronic pancreatitis. 7. Edema of the left psoas muscle is likely reactive to the pathologic L4 fracture. 8. Nonspecific trace ascites. 9. Aortic atherosclerosis. Electronically Signed   By: Lajean Manes M.D.   On: 12/16/2017 13:17    EKG:   Orders placed or performed during the hospital encounter of 12/14/17  . ED EKG 12-Lead  . ED EKG 12-Lead  . EKG 12-Lead  . EKG 12-Lead    ASSESSMENT AND PLAN:   72 year old male with past medical history significant for bipolar/depression, diabetes and psoriasis was brought in secondary to generalized weakness and weight loss.  1.  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 -Pathologic compression fracture of L4 noted. Not sure if kyphoplasty can be done with his  tumor burden- ortho consulted -Pain medications for now. -Physical therapy as tolerated  2.  Left psoas muscle edema-with fevers, abscess could not be ruled out. -Appreciate ID consult.  Continue vancomycin and cefepime for now. -Fevers could also be coming from underlying cancer. - wbc still elevated  3.  Metastatic disease of unknown primary-appreciate oncology consult. -CT abdomen and pelvis showing spinal disease and also an axillary lymph node- plan for biopsy of the lymph node tomorrow -LDH is elevated.  Concern for lymphoma  4.  DVT prophylaxis-Lovenox- discontinued today for possible biopsy tomorrow  Palliative care consult for goals of conversation    All the records are reviewed and case discussed with Care Management/Social Workerr. Management plans discussed with the patient, family and they are in agreement.  CODE STATUS: Full code  TOTAL TIME TAKING CARE OF THIS PATIENT: 36 minutes.   POSSIBLE D/C IN ? DAYS, DEPENDING ON CLINICAL CONDITION.   Gladstone Lighter M.D on 12/18/2017 at 12:16 PM  Between 7am to 6pm - Pager - [phone]  After 6pm go to [email or web address] - password [email or web address] [employer]  Office  [phone]  CC: Primary care physician; Glean Hess, MD

## 2017-12-18 NOTE — Progress Notes (Signed)
Pt has elevated temp see flow sheets. Dr. Carolin Guernsey notified new orders given.

## 2017-12-18 NOTE — Plan of Care (Signed)
  Problem: Education: Goal: Knowledge of General Education information will improve Description: Including pain rating scale, medication(s)/side effects and non-pharmacologic comfort measures Outcome: Progressing   Problem: Health Behavior/Discharge Planning: Goal: Ability to manage health-related needs will improve Outcome: Progressing   Problem: Elimination: Goal: Will not experience complications related to bowel motility Outcome: Progressing Goal: Will not experience complications related to urinary retention Outcome: Progressing   

## 2017-12-19 ENCOUNTER — Inpatient Hospital Stay: Payer: Medicare Other

## 2017-12-19 DIAGNOSIS — R509 Fever, unspecified: Secondary | ICD-10-CM

## 2017-12-19 DIAGNOSIS — D72829 Elevated white blood cell count, unspecified: Secondary | ICD-10-CM

## 2017-12-19 DIAGNOSIS — L989 Disorder of the skin and subcutaneous tissue, unspecified: Secondary | ICD-10-CM

## 2017-12-19 DIAGNOSIS — D649 Anemia, unspecified: Secondary | ICD-10-CM

## 2017-12-19 DIAGNOSIS — Z7984 Long term (current) use of oral hypoglycemic drugs: Secondary | ICD-10-CM

## 2017-12-19 DIAGNOSIS — A419 Sepsis, unspecified organism: Secondary | ICD-10-CM

## 2017-12-19 DIAGNOSIS — C799 Secondary malignant neoplasm of unspecified site: Secondary | ICD-10-CM

## 2017-12-19 DIAGNOSIS — R531 Weakness: Secondary | ICD-10-CM

## 2017-12-19 DIAGNOSIS — F17210 Nicotine dependence, cigarettes, uncomplicated: Secondary | ICD-10-CM

## 2017-12-19 DIAGNOSIS — M8448XA Pathological fracture, other site, initial encounter for fracture: Secondary | ICD-10-CM

## 2017-12-19 DIAGNOSIS — R590 Localized enlarged lymph nodes: Secondary | ICD-10-CM | POA: Diagnosis not present

## 2017-12-19 DIAGNOSIS — R222 Localized swelling, mass and lump, trunk: Secondary | ICD-10-CM

## 2017-12-19 DIAGNOSIS — R634 Abnormal weight loss: Secondary | ICD-10-CM

## 2017-12-19 DIAGNOSIS — F1021 Alcohol dependence, in remission: Secondary | ICD-10-CM

## 2017-12-19 DIAGNOSIS — Z22321 Carrier or suspected carrier of Methicillin susceptible Staphylococcus aureus: Secondary | ICD-10-CM

## 2017-12-19 DIAGNOSIS — M48061 Spinal stenosis, lumbar region without neurogenic claudication: Secondary | ICD-10-CM

## 2017-12-19 DIAGNOSIS — Z515 Encounter for palliative care: Secondary | ICD-10-CM

## 2017-12-19 DIAGNOSIS — E11620 Type 2 diabetes mellitus with diabetic dermatitis: Secondary | ICD-10-CM

## 2017-12-19 DIAGNOSIS — R627 Adult failure to thrive: Secondary | ICD-10-CM

## 2017-12-19 DIAGNOSIS — K6812 Psoas muscle abscess: Secondary | ICD-10-CM

## 2017-12-19 LAB — GLUCOSE, CAPILLARY
GLUCOSE-CAPILLARY: 110 mg/dL — AB (ref 70–99)
GLUCOSE-CAPILLARY: 181 mg/dL — AB (ref 70–99)
Glucose-Capillary: 95 mg/dL (ref 70–99)
Glucose-Capillary: 78 mg/dL (ref 70–99)

## 2017-12-19 LAB — CBC WITH DIFFERENTIAL/PLATELET
BASOS ABS: 0.1 10*3/uL (ref 0–0.1)
Basophils Relative: 0 %
EOS ABS: 0.7 10*3/uL (ref 0–0.7)
Eosinophils Relative: 3 %
HCT: 30.2 % — ABNORMAL LOW (ref 40.0–52.0)
Hemoglobin: 9.5 g/dL — ABNORMAL LOW (ref 13.0–18.0)
Lymphocytes Relative: 7 %
Lymphs Abs: 1.4 10*3/uL (ref 1.0–3.6)
MCH: 25.8 pg — ABNORMAL LOW (ref 26.0–34.0)
MCHC: 31.4 g/dL — AB (ref 32.0–36.0)
MCV: 82.2 fL (ref 80.0–100.0)
MONOS PCT: 5 %
Monocytes Absolute: 1.1 10*3/uL — ABNORMAL HIGH (ref 0.2–1.0)
Neutro Abs: 17.8 10*3/uL — ABNORMAL HIGH (ref 1.4–6.5)
Neutrophils Relative %: 85 %
Platelets: 354 10*3/uL (ref 150–440)
RBC: 3.67 MIL/uL — ABNORMAL LOW (ref 4.40–5.90)
RDW: 16.5 % — AB (ref 11.5–14.5)
WBC: 21 10*3/uL — ABNORMAL HIGH (ref 3.8–10.6)

## 2017-12-19 LAB — BASIC METABOLIC PANEL
Anion gap: 7 (ref 5–15)
BUN: 16 mg/dL (ref 8–23)
CALCIUM: 8.5 mg/dL — AB (ref 8.9–10.3)
CO2: 26 mmol/L (ref 22–32)
CREATININE: 1.03 mg/dL (ref 0.61–1.24)
Chloride: 103 mmol/L (ref 98–111)
GFR calc Af Amer: >60 mL/min (ref >60)
Glucose, Bld: 123 mg/dL — ABNORMAL HIGH (ref 70–99)
Potassium: 4 mmol/L (ref 3.5–5.1)
SODIUM: 136 mmol/L (ref 135–145)

## 2017-12-19 LAB — CULTURE, BLOOD (ROUTINE X 2)
CULTURE: NO GROWTH
Culture: NO GROWTH
SPECIAL REQUESTS: ADEQUATE
SPECIAL REQUESTS: ADEQUATE

## 2017-12-19 LAB — VANCOMYCIN, TROUGH: VANCOMYCIN TR: 19 ug/mL (ref 15–20)

## 2017-12-19 LAB — MULTIPLE MYELOMA PANEL, SERUM
Albumin SerPl Elph-Mcnc: 2.4 g/dL — ABNORMAL LOW (ref 2.9–4.4)
Albumin/Glob SerPl: 0.8 (ref 0.7–1.7)
Alpha 1: 0.4 g/dL (ref 0.0–0.4)
Alpha2 Glob SerPl Elph-Mcnc: 1 g/dL (ref 0.4–1.0)
B-Globulin SerPl Elph-Mcnc: 1.2 g/dL (ref 0.7–1.3)
Gamma Glob SerPl Elph-Mcnc: 0.8 g/dL (ref 0.4–1.8)
Globulin, Total: 3.4 g/dL (ref 2.2–3.9)
IgA: 656 mg/dL — ABNORMAL HIGH (ref 61–437)
IgG (Immunoglobin G), Serum: 866 mg/dL (ref 700–1600)
IgM (Immunoglobulin M), Srm: 62 mg/dL (ref 15–143)
Total Protein ELP: 5.8 g/dL — ABNORMAL LOW (ref 6.0–8.5)

## 2017-12-19 MED ORDER — FENTANYL CITRATE (PF) 100 MCG/2ML IJ SOLN
INTRAMUSCULAR | Status: AC
  Filled 2017-12-19: qty 4

## 2017-12-19 MED ORDER — OXYCODONE-ACETAMINOPHEN 5-325 MG PO TABS: 1.0000 | ORAL_TABLET | Freq: Four times a day (QID) | ORAL | Status: DC

## 2017-12-19 MED ORDER — OXYCODONE HCL 5 MG PO TABS: 5.0000 mg | ORAL_TABLET | Freq: Four times a day (QID) | ORAL | Status: DC | PRN

## 2017-12-19 MED ORDER — ENOXAPARIN SODIUM 30 MG/0.3ML ~~LOC~~ SOLN
30.0000 mg | SUBCUTANEOUS | Status: DC
  Administered 2017-12-20 – 2017-12-21 (×2): 30 mg via SUBCUTANEOUS
  Filled 2017-12-19 (×2): qty 0.3

## 2017-12-19 MED ORDER — DOXYCYCLINE HYCLATE 100 MG PO TABS
100.0000 mg | ORAL_TABLET | Freq: Two times a day (BID) | ORAL | Status: DC
  Administered 2017-12-19 – 2017-12-22 (×7): 100 mg via ORAL
  Filled 2017-12-19 (×7): qty 1

## 2017-12-19 MED ORDER — MIDAZOLAM HCL 5 MG/5ML IJ SOLN
INTRAMUSCULAR | Status: AC | PRN
  Administered 2017-12-19: 0.5 mg via INTRAVENOUS
  Administered 2017-12-19: 1 mg via INTRAVENOUS
  Administered 2017-12-19: 0.5 mg via INTRAVENOUS

## 2017-12-19 MED ORDER — OXYCODONE-ACETAMINOPHEN 5-325 MG PO TABS
1.0000 | ORAL_TABLET | Freq: Four times a day (QID) | ORAL | Status: DC
  Administered 2017-12-19 – 2017-12-20 (×5): 1 via ORAL
  Filled 2017-12-19 (×5): qty 1

## 2017-12-19 MED ORDER — FENTANYL CITRATE (PF) 100 MCG/2ML IJ SOLN
INTRAMUSCULAR | Status: AC | PRN
  Administered 2017-12-19 (×2): 25 ug via INTRAVENOUS
  Administered 2017-12-19: 50 ug via INTRAVENOUS

## 2017-12-19 MED ORDER — MIDAZOLAM HCL 5 MG/5ML IJ SOLN
INTRAMUSCULAR | Status: AC
  Filled 2017-12-19: qty 5

## 2017-12-19 MED ORDER — TRIAMCINOLONE 0.1 % CREAM:EUCERIN CREAM 1:1
TOPICAL_CREAM | Freq: Two times a day (BID) | CUTANEOUS | Status: DC
  Administered 2017-12-19 (×2): via TOPICAL
  Filled 2017-12-19: qty 1

## 2017-12-19 MED ORDER — SENNA 8.6 MG PO TABS
2.0000 | ORAL_TABLET | Freq: Every day | ORAL | Status: DC
  Administered 2017-12-19: 17.2 mg via ORAL
  Filled 2017-12-19: qty 2

## 2017-12-19 NOTE — Consult Note (Signed)
MEDICATION-RELATED CONSULT NOTE   IR Procedure Consult - Anticoagulant/Antiplatelet PTA/Inpatient Med List Review by Pharmacist   Procedure: US guided biopsy of indeterminate right axillary LN    Completed: 09/09 @ 1125  Post-Procedural bleeding risk per IR MD assessment:  Standard   Antithrombotic medications on inpatient or PTA profile prior to procedure:    Enoxaparin 30mg     Recommended restart time per IR Post-Procedure Guidelines:   Day + 1 (next AM)  Plan:     Will restart enoxaparin on 9/10   Pernell Dupre, PharmD, BCPS Clinical Pharmacist 12/19/2017 12:08 PM

## 2017-12-19 NOTE — Progress Notes (Addendum)
Pharmacy Antibiotic Note  Stephen [name]. is a 72 y.o. male admitted on 12/14/2017 with sepsis.  Pharmacy has been consulted for vancomycin and Zosyn dosing. PMH includes COPD, DM, psoriasis. He presented for diffuse back spasms, fever and was found to be hypoxic with generalized weakness and leukocytosis without obvious source other than possibly his bedsores. CXR did not reveal any acute process.   Zosyn discontinued by ID on 9/5, however Dr. Benjie Karvonen would like to restart it due to patient still  having a fever.   Plan: K: 0.042, T1/2: 16.5h, Vd: 30L  Continue Vancomycin 750mg  IV every 24 hours. Calculated trough at  Css: 17.3 mcg/mL  Goal trough 15-20 mcg/mL. Vanc trough ordered prior to the 4th dose.   9/7 0200 vanc level 12. Changed to 1 gram q 24 hours. Level before 3rd new dose.  9/9 0200 vanc level 19. Continue current regimen. SCr/BUN slowly increasing. Level ordered before 9/12 dose to ensure clearance.  Restart Zosyn 3.375 IV EI every 8 hours.   Height: 5\' 9"  (175.3 cm) Weight: 90 lb (40.8 kg) IBW/kg (Calculated) : 70.7  Temp (24hrs), Avg:100.2 F (37.9 C), Min:97.9 F (36.6 C), Max:102.9 F (39.4 C)  Recent Labs  Lab 12/14/17 1045 12/15/17 0425 12/16/17 0429 12/17/17 0143 12/19/17 0218  WBC 22.5* 16.2* 19.0* 19.7* 21.0*  CREATININE 0.88 0.75 0.85 0.86 1.03  LATICACIDVEN 1.3  --   --   --   --   VANCOTROUGH  --   --   --  12* 19    Estimated Creatinine Clearance: 36.9 mL/min (by C-G formula based on SCr of 1.03 mg/dL).    No Known Allergies  Antimicrobials this admission: Vancomycin 9/4 >>  Zosyn 9/4 >>    Microbiology results: 9/4 BCx: NG x 2 days  9/4 UCx: Insignificant growth  9/5 Aerobic Cx: Moderate GPC  9/5 MRSA PCR (-)   Thank you for allowing pharmacy to be a part of this patient's care.  Eloise Harman, PharmD, BCPS Clinical Pharmacist 12/19/2017 4:02 AM

## 2017-12-19 NOTE — Progress Notes (Signed)
Stephen [name]. is a 72 y.o. male with h/o DM dermatitis Is brought in by EMS \\with  generalized weakness, .  As per patient his neighbors called the paramedic because he was very weak and staying in bed.  He lives on his won. Has had back pain for months and takes advil OTC 2 tablets every 6 hrs.  He has lost 50 pounds of weight in an undefined period. Has had a skin condition for the past few years and has seen a dermatologist at [facility] in the past and was being treated as eczema VS psoriasis.  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 He says vodka was his drink and he was an alcoholic.  He has had baseline cough, poor appetite  Subjective C/o itching all over Had axillary LN biopsy today Fever persist    OBJECTIVE:  Patient Vitals for the past 24 hrs:  BP Temp Temp src Pulse Resp SpO2  12/19/17 1701 (!) 147/64 98.4 F (36.9 C) Oral 89 - 93 %  12/19/17 1306 (!) 113/56 97.9 F (36.6 C) Oral 71 - 97 %  12/19/17 1124 129/79 - - 75 15 99 %  12/19/17 1118 121/75 - - 79 17 99 %  12/19/17 1111 122/74 - - - - -  12/19/17 1109 126/71 - - 73 (!) 26 100 %  12/19/17 1104 133/80 - - 74 11 99 %  12/19/17 1055 136/85 - - 69 11 100 %  12/19/17 1037 123/72 - - 80 - 100 %  12/19/17 0754 117/66 98.2 F (36.8 C) Oral 73 - 100 %  12/18/17 2311 (!) 104/54 98.2 F (36.8 C) Oral 93 19 98 %  12/18/17 1934 - (!) 101.2 F (38.4 C) Oral - - -  12/18/17 1823 - (!) 102.9 F (39.4 C) - - - -   Physical Exam  Constitutional: letahrgic Emaciated, frail, pale, fatigued  Cardiovascular:  s1s2 No murmur  Pulmonary/Chest:  B/l air entry decreased bases  Abdominal: Soft. There is no tenderness.  Musculoskeletal: Normal range of motion.  Neurological: He is awake but letahrgic- answers some questions.  Grossly non focal-not examined in detail  Skin:  Very dry skin over the palms with icthyosis and splitting of skin excoriations over the elbows              3 cm  palpable, firm irregular lymphnodes in the rt axilla  B/l inguinal nodes palpable  Lab Results  CBC  CBC Latest Ref Rng & Units 12/19/2017 12/17/2017 12/16/2017  WBC 3.8 - 10.6 K/uL 21.0(H) 19.7(H) 19.0(H)  Hemoglobin 13.0 - 18.0 g/dL 9.5(L) 9.8(L) 9.0(L)  Hematocrit 40.0 - 52.0 % 30.2(L) 29.1(L) 28.0(L)  Platelets 150 - 440 K/uL 354 324 325   CMP Latest Ref Rng & Units 12/19/2017 12/17/2017 12/16/2017  Glucose 70 - 99 mg/dL 123(H) 105(H) 84  BUN 8 - 23 mg/dL 16 11 14   Creatinine 0.61 - 1.24 mg/dL 1.03 0.86 0.85  Sodium 135 - 145 mmol/L 136 133(L) 134(L)  Potassium 3.5 - 5.1 mmol/L 4.0 4.4 4.0  Chloride 98 - 111 mmol/L 103 98 99  CO2 22 - 32 mmol/L 26 29 29   Calcium 8.9 - 10.3 mg/dL 8.5(L) 8.2(L) 8.0(L)  Total Protein 6.5 - 8.1 g/dL - - -  Total Bilirubin 0.3 - 1.2 mg/dL - - -  Alkaline Phos 38 - 126 U/L - - -  AST 15 - 41 U/L - - -  ALT 0 -  44 U/L - - -    Micro Blood culture negative    Pathologic compression fracture of L4 with ventral epidural tumor and severe spinal stenosis. 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 Left  periaortic lymph node enlargement. Numerous additional lesions inthe lumbar spine. Findings are consistent with metastatic disease.  Favor prostate cancer given sclerotic appearance on x-ray. Recommend  correlation with PSA and rectal exam.  Mass lesion extends into the left psoas muscle. There is central nonenhancing necrosis in the left psoas muscle most likely due to tumor. Psoas abscess could have a similar appearance.  Advanced multilevel degenerative change throughout the lumbar spine  as above.  Radiographs and labs were personally reviewed by me.    Assessment and Plan  72 y.o. male with h/o DM dermatitis Is brought in by EMS \\with  generalized weakness, .   Fever with leucocytosis- could be from lymphoma, but because he is colonized  with staph aureus , has skin lesions and a psoas muscle lesion will need to  r/o psoas abscess He could have both. He was on vanco/zosyn and then vanco+ cefepime since his admission and has not responded The IV antibiotics were changed to Doxy by the pharmacist/primary team- Not sure what it is helping with ? If we are treating the dermatitis we need an IV antibiotic (but he has not responded to IV )  If the psoas lesion  is really an infection then Doxy is not an appropriate antibiotic.  Would recommend IR do a diagnostic aspiration of the left psoas muscle lesion and send for culture/pathology  Weight loss, fatigue,, ichthyotic skin of the palms and soles concerning for malignancy  Could be mycosis fungoides, or paraneoplastic dermatosis  On clinical examination he has firm enlarged lymphnodes in the rt axilla and some b/l inguinal LN. This is malignancy as reactive lymphnodes are not this enlarged and usually tender  Lymphoma is a concern.  Also has extensive vertebral disease  Being a smoker he is at risk for lung cancer with mets   Psoas muscle infiltration with Pathologic compression fracture of L4 with ventral epidural tumor and severe spinal stenosis. 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

## 2017-12-19 NOTE — Consult Note (Signed)
Chief Complaint: Metastatic disease of unknown primary  Referring Physician(s): Corcoran  Patient Status: [facility] - In-pt  History of Present Illness: Stephen [name]. is a 72 y.o. male with personal history significant for diabetes and alcoholism who was found to have a pathologic L4 compression fracture on lumbar spine MRI performed 12/15/2017.  Subsequent CT scan of the chest, abdomen and pelvis demonstrated extensive metastatic disease and as such, request made for ultrasound-guided right axillary lymph node biopsy for tissue diagnostic purposes.  Patient continues to complain of generalized weakness and back pain.  He denies fever or chills.    Past Medical History:  Diagnosis Date  . Diabetes mellitus without complication ([place])   . Major depressive disorder, single episode, unspecified 07/18/2017  . Psoriasis   . Skin abnormalities     Past Surgical History:  Procedure Laterality Date  . COLONOSCOPY  1995   one polyp  . ESOPHAGOGASTRODUODENOSCOPY N/A 05/02/2015   Procedure: ESOPHAGOGASTRODUODENOSCOPY (EGD);  Surgeon: Hulen Luster, MD;  Location: [facility] ENDOSCOPY;  Service: Endoscopy;  Laterality: N/A;    Allergies: Patient has no known allergies.  Medications: Prior to Admission medications   Medication Sig Start Date End Date Taking? Authorizing Provider  metFORMIN (GLUCOPHAGE) 500 MG tablet Take 1 tablet (500 mg total) by mouth 4 (four) times daily. 10/18/17  Yes Glean Hess, MD  baclofen (LIORESAL) 10 MG tablet Take 1 tablet (10 mg total) by mouth 3 (three) times daily as needed for muscle spasms. 08/18/17   Glean Hess, MD  diphenhydrAMINE (BENADRYL) 25 mg capsule TAKE 1 CAPSULE (25 MG TOTAL) BY MOUTH EVERY 4 (FOUR) HOURS AS NEEDED FOR ITCHING. 04/13/17   Glean Hess, MD  glucose blood (ONE TOUCH ULTRA TEST) test strip TEST BY OTHER ROUTE DAILY AT 0600. 10/14/17   Glean Hess, MD  insulin aspart (NOVOLOG FLEXPEN) 100 UNIT/ML FlexPen Inject 4 Units into  the skin daily as needed for high blood sugar (for BS > 200). 07/21/16   Glean Hess, MD  Insulin Pen Needle (PEN NEEDLES) 31G X 5 MM MISC 1 each by Does not apply route daily. 07/21/16   Glean Hess, MD  naproxen sodium (ALEVE) 220 MG tablet Take 440 mg by mouth daily as needed.    [provider]  triamcinolone ointment (KENALOG) 0.1 % Apply 2 application topically 2 (two) times daily. 09/27/17 09/27/18  Glean Hess, MD     Family History  Problem Relation Age of Onset  . Heart disease Mother   . Migraines Father   . Aneurysm Father   . Cancer Brother     Social History   Socioeconomic History  . Marital status: Divorced    Spouse name: Not on file  . Number of children: 1  . Years of education: Not on file  . Highest education level: 8th grade  Occupational History  . Occupation: Retired  Scientific laboratory technician  . Financial resource strain: Not hard at all  . Food insecurity:    Worry: Never true    Inability: Never true  . Transportation needs:    Medical: No    Non-medical: No  Tobacco Use  . Smoking status: Current Every Day Smoker    Packs/day: 1.00    Years: 65.00    Pack years: 65.00    Types: Cigarettes, Cigars  . Smokeless tobacco: Former Systems developer  . Tobacco comment: discussed options of quitting  Substance and Sexual Activity  . Alcohol use: Not Currently  Alcohol/week: 0.0 standard drinks  . Drug use: Not Currently  . Sexual activity: Not Currently  Lifestyle  . Physical activity:    Days per week: 0 days    Minutes per session: 0 min  . Stress: Not at all  Relationships  . Social connections:    Talks on phone: Patient refused    Gets together: Patient refused    Attends religious service: Patient refused    Active member of club or organization: Patient refused    Attends meetings of clubs or organizations: Patient refused    Relationship status: Divorced  Other Topics Concern  . Not on file  Social History Narrative   Illiterate     ECOG Status: 3 - Symptomatic, >50% confined to bed  Review of Systems: A 12 point ROS discussed and pertinent positives are indicated in the HPI above.  All other systems are negative.  Review of Systems  Constitutional: Positive for activity change, fatigue and unexpected weight change. Negative for chills and fever.  Respiratory: Negative.   Cardiovascular: Negative.   Musculoskeletal: Positive for back pain.    Vital Signs: BP 123/72 (BP Location: Left Arm)   Pulse 80   Temp 98.2 F (36.8 C) (Oral)   Resp 19   Ht 5\' 9"  (1.753 m)   Wt 40.8 kg   SpO2 100%   BMI 13.29 kg/m   Physical Exam  Constitutional:  cachetic appearing  Cardiovascular: Normal rate and regular rhythm.  Pulmonary/Chest: Effort normal and breath sounds normal.  Psychiatric: He has a normal mood and affect. His behavior is normal.  Nursing note and vitals reviewed.   Imaging: Dg Eye Foreign Body  Result Date: 12/15/2017 CLINICAL DATA:  History of metal removed from eye. EXAM: ORBITS FOR FOREIGN BODY - 2 VIEW COMPARISON:  None. FINDINGS: There is no evidence of metallic foreign body within the orbits. No significant bone abnormality identified. IMPRESSION: No evidence of metallic foreign body within the orbits. Electronically Signed   By: Lucienne Capers M.D.   On: 12/15/2017 03:07   Dg Thoracic Spine 2 View  Result Date: 12/14/2017 CLINICAL DATA:  Back pain EXAM: THORACIC SPINE 2 VIEWS COMPARISON:  None. FINDINGS: Normal alignment. Negative for thoracic fracture or mass. Sclerotic L1 vertebral body as described on the lumbar spine examination. Mild disc degeneration at multiple levels in the thoracic spine. IMPRESSION: Mild thoracic disc degeneration.  Negative for fracture or mass. Electronically Signed   By: Franchot Gallo M.D.   On: 12/14/2017 15:15   Dg Lumbar Spine 2-3 Views  Result Date: 12/14/2017 CLINICAL DATA:  Low back pain.  Fever and leukocytosis. EXAM: LUMBAR SPINE - 2-3 VIEW COMPARISON:   CT abdomen pelvis 12/14/2014 FINDINGS: Sclerotic L1 vertebral body without fracture Sclerotic L4 vertebral body with moderate compression fracture. No fracture on the prior CT. Fracture age is indeterminate. Mild retrolisthesis L3-4. Multilevel disc degeneration with disc space narrowing and spurring L1 through S1 of a moderate degree. Atherosclerotic calcification in the aorta and iliac arteries. IMPRESSION: Sclerotic L1 vertebral body without fracture. Moderate compression fracture L4 with sclerotic vertebral body. Findings are worrisome for metastatic disease. Possible prostate cancer. Recommend MRI lumbar spine without with contrast. If the patient cannot have MRI, CT lumbar spine could be performed Multilevel disc degeneration and spondylosis throughout the lumbar spine. Electronically Signed   By: Franchot Gallo M.D.   On: 12/14/2017 15:14   Ct Chest W Contrast  Result Date: 12/16/2017 CLINICAL DATA:  F/u to MR done yesterday  with concern for metastatic disease. Pt unable to follow direction due to confusion. Difficulty holding still and laying on back. Brought in by EMS with generalized weakness. 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 He says vodka was his drink and he was an alcoholic.He has had baseline cough, poor appetite EXAM: CT CHEST, ABDOMEN, AND PELVIS WITH CONTRAST TECHNIQUE: Multidetector CT imaging of the chest, abdomen and pelvis was performed following the standard protocol during bolus administration of intravenous contrast. CONTRAST:  2mL OMNIPAQUE IOHEXOL 300 MG/ML  SOLN COMPARISON:  Abdomen pelvis CT, 12/14/2014. Previous day's lumbar spine MRI. FINDINGS: CT CHEST FINDINGS Cardiovascular: Heart is top-normal in size. No pericardial effusion. Mild three-vessel coronary artery calcifications. Aorta is normal in caliber. No dissection. No significant atherosclerosis. Main pulmonary artery  is mildly dilated. Right pulmonary artery measures 3 cm in diameter, left 2.8 cm. Mediastinum/Nodes: Several prominent mediastinal lymph nodes. There are 2 right paratracheal nodes measuring 1 cm short axis. No mediastinal masses. No hilar masses or adenopathy. Trachea is normal in caliber and widely patent. Esophagus is unremarkable. Lungs/Pleura: Moderate centrilobular emphysema. Dependent opacity in the lower lobes consistent with subsegmental atelectasis. Small calcified granuloma in the subpleural, inferior, posterior right lower lobe. Trace left pleural effusion. Atelectasis or scarring at the posterior base of the left upper lobe lingula and anteromedial right middle lobe. No convincing pneumonia. No pulmonary edema. No lung mass or suspicious nodule. No pneumothorax. Chest wall and soft tissues: There are prominent and enlarged axillary lymph nodes, most evident on the right. Largest node measures 4.4 x 1.8 cm transversely. CT ABDOMEN PELVIS FINDINGS Hepatobiliary: Liver normal in overall size. There are subtle, ill-defined hypoattenuating liver lesions, the most discrete noted at the dome of segment 7, measuring 11 mm. These are new since the prior CT suspicious for metastatic disease. Gallbladder is distended. There small dependent stones. No wall thickening or adjacent inflammation. No bile duct dilation. Pancreas: Atrophic with dystrophic calcifications throughout the tail consistent with chronic pancreatitis. No mass or active inflammation. Spleen: Normal in size without focal abnormality. Adrenals/Urinary Tract: No adrenal masses. Kidneys normal size, orientation and position. No masses or stones. No hydronephrosis. Ureters not well visualized but are normal in overall caliber and course. No convincing ureteral stone. Bladder is unremarkable. Stomach/Bowel: Stomach is unremarkable. No bowel obstruction or inflammation. Mild to moderate increased stool most evident in the right colon with mild  distention of the right colon, maximum diameter, 6.4 cm. Appendix not defined on this exam. No evidence of appendicitis. Vascular/Lymphatic: Abdominal aorta is tortuous with atherosclerotic disease. No aneurysm or dissection. There are several prominent to mildly enlarged lymph nodes. There is a left periaortic shotty node at the level of the left renal vein measuring 1.5 cm in short axis. There are prominent inguinal lymph nodes, largest on the left measuring 12 mm in short axis. Reproductive: Unremarkable. Other: There is decreased attenuation and relative enlargement of the left psoas muscle hazy adjacent inflammation or edema. Trace amount of ascites in the pelvis and adjacent to the liver. MUSCULOSKELETAL FINDINGS Pathologic fracture of L4 as noted on the previous day's lumbar MRI. There are multiple areas of ill-defined sclerosis throughout the spine, also noted in the sternum. No other fractures. IMPRESSION: 1. 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 The primary malignancy is unclear. There are no lung masses. 2. No acute findings in the chest, abdomen and pelvis. 3. Moderate emphysema. Mild enlargement of the pulmonary arteries. Consider pulmonary hypertension in the proper clinical setting. 4. Mild coronary artery calcifications. 5. Distended gallbladder with gallstones but no findings of acute cholecystitis. 6. Chronic pancreatitis. 7. Edema of the left psoas muscle is likely reactive to the pathologic L4 fracture. 8. Nonspecific trace ascites. 9. Aortic atherosclerosis. Electronically Signed   By: Lajean Manes M.D.   On: 12/16/2017 13:17   Mr Lumbar Spine W Wo Contrast  Result Date: 12/15/2017 CLINICAL DATA:  Back pain.  Abnormal x-ray EXAM: MRI LUMBAR SPINE WITHOUT AND WITH  CONTRAST TECHNIQUE: Multiplanar and multiecho pulse sequences of the lumbar spine were obtained without and with intravenous contrast. CONTRAST:  94mL MULTIHANCE GADOBENATE DIMEGLUMINE 529 MG/ML IV SOLN COMPARISON:  Lumbar spine radiographs 12/14/2017 FINDINGS: Segmentation:  Normal Alignment:  Mild retrolisthesis L4-5 Vertebrae: Multiple areas of abnormal bone signal throughout the lumbar spine, highly suspicious for metastatic disease. Heterogeneous bone marrow T12 vertebral body. Sclerotic lesion at L1 is noted on x-ray. Decreased signal on T1 and T2 throughout the L1 vertebral body extending into the posterior elements. Pathologic fracture of L4 with abnormal bone marrow diffusely throughout the vertebral body and spinous process compatible with metastatic disease. 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 Paraspinous soft tissue thickening due to extraosseous tumor extension surrounding the vertebral body. Abnormal bone marrow S1 vertebral body also consistent with metastatic disease. Small lesion left posterior iliac bone adjacent to the SI joint also compatible with tumor. Conus medullaris and cauda equina: Conus extends to the L1-2 level. Conus and cauda equina appear normal. Paraspinal and other soft tissues: Paraspinous soft tissue surrounding the L4 vertebral body consistent with extraosseous tumor. Enhancing mass lateral to the neural foramina on the left at L3-4 with enhancing mass in the left psoas muscle compatible with tumor. Left periaortic lymph node 2 cm, probable malignant adenopathy. Disc levels: T12-L1: Negative for stenosis L1-2: Mild disc degeneration without stenosis L2-3: Moderate disc degeneration with disc bulging and spurring right greater than left. 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 Moderate foraminal stenosis bilaterally L4-5: Central disc  protrusion. Disc bulging and facet hypertrophy with moderate to severe spinal stenosis L5-S1: Moderate disc degeneration and spondylosis. Moderate to advanced subarticular stenosis bilaterally. IMPRESSION: Pathologic compression fracture of L4 with ventral epidural tumor and severe spinal stenosis. 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 Left periaortic lymph node enlargement. Numerous additional lesions in the lumbar spine. Findings are consistent with metastatic disease. Favor prostate cancer given sclerotic appearance on x-ray. Recommend correlation with PSA and rectal exam. Mass lesion extends into the left psoas muscle. There is central nonenhancing necrosis in the left psoas muscle most likely due to tumor. Psoas abscess could have a similar appearance. Advanced multilevel degenerative change throughout the lumbar spine as above. Electronically Signed   By: Franchot Gallo M.D.   On: 12/15/2017 07:22   Ct Abdomen Pelvis W Contrast  Result Date: 12/16/2017 CLINICAL DATA:  F/u to MR done yesterday with concern for metastatic disease. Pt unable to follow direction due to confusion. Difficulty holding still and laying on back. Brought in by EMS with generalized weakness. Has had back pain for months, 50 lb  weight loss in undefined period.He is a smoker with 40 pack years and was an alcoholic till a year ago. He says vodka was his drink and he was an alcoholic.He has had baseline cough, poor appetite EXAM: CT CHEST, ABDOMEN, AND PELVIS WITH CONTRAST TECHNIQUE: Multidetector CT imaging of the chest, abdomen and pelvis was performed following the standard protocol during bolus administration of intravenous contrast. CONTRAST:  34mL OMNIPAQUE IOHEXOL 300 MG/ML  SOLN COMPARISON:  Abdomen pelvis CT, 12/14/2014. Previous day's lumbar spine MRI. FINDINGS: CT CHEST FINDINGS Cardiovascular: Heart is top-normal in size. No pericardial effusion. Mild three-vessel coronary artery  calcifications. Aorta is normal in caliber. No dissection. No significant atherosclerosis. Main pulmonary artery is mildly dilated. Right pulmonary artery measures 3 cm in diameter, left 2.8 cm. Mediastinum/Nodes: Several prominent mediastinal lymph nodes. There are 2 right paratracheal nodes measuring 1 cm short axis. No mediastinal masses. No hilar masses or adenopathy. Trachea is normal in caliber and widely patent. Esophagus is unremarkable. Lungs/Pleura: Moderate centrilobular emphysema. Dependent opacity in the lower lobes consistent with subsegmental atelectasis. Small calcified granuloma in the subpleural, inferior, posterior right lower lobe. Trace left pleural effusion. Atelectasis or scarring at the posterior base of the left upper lobe lingula and anteromedial right middle lobe. No convincing pneumonia. No pulmonary edema. No lung mass or suspicious nodule. No pneumothorax. Chest wall and soft tissues: There are prominent and enlarged axillary lymph nodes, most evident on the right. Largest node measures 4.4 x 1.8 cm transversely. CT ABDOMEN PELVIS FINDINGS Hepatobiliary: Liver normal in overall size. There are subtle, ill-defined hypoattenuating liver lesions, the most discrete noted at the dome of segment 7, measuring 11 mm. These are new since the prior CT suspicious for metastatic disease. Gallbladder is distended. There small dependent stones. No wall thickening or adjacent inflammation. No bile duct dilation. Pancreas: Atrophic with dystrophic calcifications throughout the tail consistent with chronic pancreatitis. No mass or active inflammation. Spleen: Normal in size without focal abnormality. Adrenals/Urinary Tract: No adrenal masses. Kidneys normal size, orientation and position. No masses or stones. No hydronephrosis. Ureters not well visualized but are normal in overall caliber and course. No convincing ureteral stone. Bladder is unremarkable. Stomach/Bowel: Stomach is unremarkable. No bowel  obstruction or inflammation. Mild to moderate increased stool most evident in the right colon with mild distention of the right colon, maximum diameter, 6.4 cm. Appendix not defined on this exam. No evidence of appendicitis. Vascular/Lymphatic: Abdominal aorta is tortuous with atherosclerotic disease. No aneurysm or dissection. There are several prominent to mildly enlarged lymph nodes. There is a left periaortic shotty node at the level of the left renal vein measuring 1.5 cm in short axis. There are prominent inguinal lymph nodes, largest on the left measuring 12 mm in short axis. Reproductive: Unremarkable. Other: There is decreased attenuation and relative enlargement of the left psoas muscle hazy adjacent inflammation or edema. Trace amount of ascites in the pelvis and adjacent to the liver. MUSCULOSKELETAL FINDINGS Pathologic fracture of L4 as noted on the previous day's lumbar MRI. There are multiple areas of ill-defined sclerosis throughout the spine, also noted in the sternum. No other fractures. IMPRESSION: 1. 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 The primary malignancy is unclear. There are no lung masses. 2. No acute findings in the chest, abdomen and pelvis. 3. Moderate emphysema. Mild  enlargement of the pulmonary arteries. Consider pulmonary hypertension in the proper clinical setting. 4. Mild coronary artery calcifications. 5. Distended gallbladder with gallstones but no findings of acute cholecystitis. 6. Chronic pancreatitis. 7. Edema of the left psoas muscle is likely reactive to the pathologic L4 fracture. 8. Nonspecific trace ascites. 9. Aortic atherosclerosis. Electronically Signed   By: Lajean Manes M.D.   On: 12/16/2017 13:17   Dg Chest Port 1 View  Result Date:  12/14/2017 CLINICAL DATA:  Fever EXAM: PORTABLE CHEST 1 VIEW COMPARISON:  None. FINDINGS: The cardiac silhouette is normal in size and configuration. No mediastinal or hilar masses. There is no evidence of adenopathy. There are thickened bronchovascular markings bilaterally, which are most likely chronic. No evidence of pneumonia or pulmonary edema. No pleural effusion or pneumothorax. Skeletal structures are demineralized but grossly intact. IMPRESSION: No acute cardiopulmonary disease. Electronically Signed   By: Lajean Manes M.D.   On: 12/14/2017 12:14    Labs:  CBC: Recent Labs    12/15/17 0425 12/16/17 0429 12/17/17 0143 12/19/17 0218  WBC 16.2* 19.0* 19.7* 21.0*  HGB 8.5* 9.0* 9.8* 9.5*  HCT 25.7* 28.0* 29.1* 30.2*  PLT 314 325 324 354    COAGS: Recent Labs    12/16/17 0429  INR 1.21  APTT 50*    BMP: Recent Labs    12/15/17 0425 12/15/17 1808 12/16/17 0429 12/17/17 0143 12/19/17 0218  NA 137  --  134* 133* 136  K 2.7* 4.1 4.0 4.4 4.0  CL 100  --  99 98 103  CO2 27  --  29 29 26   GLUCOSE 70  --  84 105* 123*  BUN 13  --  14 11 16   CALCIUM 7.6*  --  8.0* 8.2* 8.5*  CREATININE 0.75  --  0.85 0.86 1.03  GFRNONAA >60  --  >60 >60 >60  GFRAA >60  --  >60 >60 >60    LIVER FUNCTION TESTS: Recent Labs    12/14/17 1045  BILITOT 0.8  AST 23  ALT 9  ALKPHOS 197*  PROT 6.9  ALBUMIN 2.7*    TUMOR MARKERS: No results for input(s): AFPTM, CEA, CA199, CHROMGRNA in the last 8760 hours.  Assessment and Plan:  Stephen [name]. is a 72 y.o. male with personal history significant for diabetes and alcoholism who was found to have a pathologic L4 compression fracture on lumbar spine MRI performed 12/15/2017.  Subsequent CT scan of the chest, abdomen and pelvis demonstrated extensive metastatic disease and as such, request made for ultrasound-guided right axillary lymph node biopsy for tissue diagnostic purposes.  Risks and benefits ultrasound-guided right axillary  lymph node biopsy was discussed with the patient including, but not limited to bleeding, infection, damage to adjacent structures or low yield requiring additional tests.  All of the patient's questions were answered, patient is agreeable to proceed.  Consent signed and in chart.  Thank you for this interesting consult.  I greatly enjoyed meeting Sebron Mcmahill. and look forward to participating in their care.  A copy of this report was sent to the requesting provider on this date.  Electronically Signed: Sandi Mariscal, MD 12/19/2017, 10:52 AM   I spent a total of 20 Minutes in face to face in clinical consultation, greater than 50% of which was counseling/coordinating care for US guided right axillary LN biopsy.

## 2017-12-19 NOTE — Procedures (Signed)
Pre Procedure Dx: Right axillary LAN Post Procedural Dx: Same  Technically successful US guided biopsy of indeterminate right axillary LN  EBL: None  No immediate complications.   Ronny Bacon, MD Pager #: [phone]

## 2017-12-19 NOTE — Progress Notes (Signed)
[facility] NAME: Stephen [name]    MR#:  [id number]  DATE OF BIRTH:  1945-11-25  SUBJECTIVE:   patient is hungry  REVIEW OF SYSTEMS:    Review of Systems  Constitutional: Negative for fever, chills weight loss HENT: Negative for ear pain, nosebleeds, congestion, facial swelling, rhinorrhea, neck pain, neck stiffness and ear discharge.   Respiratory: Negative for cough, shortness of breath, wheezing  Cardiovascular: Negative for chest pain, palpitations and leg swelling.  Gastrointestinal: Negative for heartburn, abdominal pain, vomiting, diarrhea or consitpation Genitourinary: Negative for dysuria, urgency, frequency, hematuria Musculoskeletal: Negative for back pain or joint pain Neurological: Negative for dizziness, seizures, syncope, focal weakness,  numbness and headaches.  Hematological: Does not bruise/bleed easily.  Psychiatric/Behavioral: Negative for hallucinations, confusion, dysphoric mood    Tolerating Diet: yes      DRUG ALLERGIES:  No Known Allergies  VITALS:  Blood pressure 129/79, pulse 75, temperature 98.2 F (36.8 C), temperature source Oral, resp. rate 15, height 5\' 9"  (1.753 m), weight 40.8 kg, SpO2 99 %.  PHYSICAL EXAMINATION:  Constitutional: Appears thin and frail no distress. HENT: Normocephalic. Marland Kitchen Oropharynx is clear and moist.  Eyes: Conjunctivae and EOM are normal. PERRLA, no scleral icterus.  Neck: Normal ROM. Neck supple. No JVD. No tracheal deviation. CVS: RRR, S1/S2 +, no murmurs, no gallops, no carotid bruit.  Pulmonary: Effort and breath sounds normal, no stridor, rhonchi, wheezes, rales.  Abdominal: Soft. BS +,  no distension, tenderness, rebound or guarding.  Musculoskeletal: Normal range of motion. No edema and no tenderness.  Neuro: Alert. CN 2-12 grossly intact. No focal deficits. Skin: Skin is warm and dry. No rash noted. Psychiatric: Normal mood and affect.       LABORATORY PANEL:   CBC Recent Labs  Lab 12/19/17 0218  WBC 21.0*  HGB 9.5*  HCT 30.2*  PLT 354   ------------------------------------------------------------------------------------------------------------------  Chemistries  Recent Labs  Lab 12/14/17 1045  12/16/17 0429  12/19/17 0218  NA 139   < > 134*   < > 136  K 3.1*   < > 4.0   < > 4.0  CL 100   < > 99   < > 103  CO2 27   < > 29   < > 26  GLUCOSE 88   < > 84   < > 123*  BUN 11   < > 14   < > 16  CREATININE 0.88   < > 0.85   < > 1.03  CALCIUM 8.2*   < > 8.0*   < > 8.5*  MG 1.4*  --  2.3  --   --   AST 23  --   --   --   --   ALT 9  --   --   --   --   ALKPHOS 197*  --   --   --   --   BILITOT 0.8  --   --   --   --    < > = values in this interval not displayed.   ------------------------------------------------------------------------------------------------------------------  Cardiac Enzymes Recent Labs  Lab 12/14/17 1045  TROPONINI 0.03*   ------------------------------------------------------------------------------------------------------------------  RADIOLOGY:  No results found.   ASSESSMENT AND PLAN:   72 year old male with a history of depression and diabetes who is brought due to generalized weakness and back pain  1.  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 Rudene Christians. amount of tumor in the paravertebral space and the epidural space kyphoplasty has a significant chance of causing a complete block and acute cauda equina syndrome.   2.  Sepsis with left psoas muscle edema with fevers Continue vancomycin and cefepime ID following BWBC 21 today will repeat tomorrow   3.  Metastatic disease of unknown primary: Biopsy today of lymph node Follow-up with oncology evaluation Concern for lymphoma    Patient is DNR and will have outpatient palliative care follow-up Palliative care  consultation appreciated     Management plans discussed with the patient and he is in agreement.  CODE STATUS: DNR  TOTAL TIME TAKING CARE OF THIS PATIENT: 22 minutes.     POSSIBLE D/C tomorrow, DEPENDING ON CLINICAL CONDITION.   Stephen [name] M.D on 12/19/2017 at 12:20 PM  Between 7am to 6pm - Pager - [phone] After 6pm go to [email or web address] - password [email or web address] [facility]  Office  [phone]  CC: Primary care physician; Glean Hess, MD  Note: This dictation was prepared with Dragon dictation along with smaller phrase technology. Any transcriptional errors that result from this process are unintentional.

## 2017-12-19 NOTE — Consult Note (Signed)
[facility] for Electrolyte Monitoring and Replacement Indication: Hypokalemia, Hypomagnesemia    Labs: Recent Labs    12/17/17 0143 12/19/17 0218  WBC 19.7* 21.0*  HGB 9.8* 9.5*  HCT 29.1* 30.2*  PLT 324 354  CREATININE 0.86 1.03   Potassium (mmol/L)  Date Value  12/19/2017 4.0   Magnesium (mg/dL)  Date Value  12/16/2017 2.3   Calcium (mg/dL)  Date Value  12/19/2017 8.5 (L)   Albumin (g/dL)  Date Value  12/14/2017 2.7 (L)  01/01/2016 3.8   Phosphorus (mg/dL)  Date Value  12/16/2017 2.5  ] Corrected Calcium: 8.64  Estimated Creatinine Clearance: 36.9 mL/min (by C-G formula based on SCr of 1.03 mg/dL).   Medical History: Past Medical History:  Diagnosis Date  . Diabetes mellitus without complication ([place])   . Major depressive disorder, single episode, unspecified 07/18/2017  . Psoriasis   . Skin abnormalities     Assessment: Pharmacy consulted for electrolyte monitoring and replacement in 72 yo male with hypokalemia and hypomagnesemia on admission.    Goal of Therapy:  Electrolytes WNL   Plan:  Electrolytes remain WNL.  No replacement needed at this time.   Pharmacy will sign off at this time.   Pernell Dupre, PharmD, BCPS Clinical Pharmacist 12/19/2017 7:18 AM

## 2017-12-19 NOTE — Consult Note (Addendum)
Consultation Note Date: 12/19/2017   Patient Name: Stephen [name].  DOB: 08-13-1945  MRN: [id number]  Age / Sex: 72 y.o., male  PCP: Glean Hess, MD Referring Physician: Bettey Costa, MD  Reason for Consultation: Establishing goals of care, Pain control and Psychosocial/spiritual support  HPI/Patient Profile: 72 y.o. male  with past medical history of polysubstance abuse, psoriasis, diabetes mellitus, depression, and malnutrition who was admitted on 12/14/2017 with sepsis and back pain.  Work up revealed metastatic disease in the area of his lumbar spine and an L4 compression fracture.  His LDH was elevated and there is a concern for biposy.  My understand was that Stephen [name] was encephalopathic during the first few days of his admission and his daughter was assisting with medical decision making.  On the morning of 9/9 Stephen [name] is coherent.   Clinical Assessment and Goals of Care:  I have reviewed medical records including EPIC notes, labs and imaging, received report from the care team, assessed the patient and then discussed diagnosis prognosis, GOC, EOL wishes, disposition and options.  I introduced Palliative Medicine as specialized medical care for people living with serious illness. It focuses on providing relief from the symptoms and stress of a serious illness. The goal is to improve quality of life for both the patient and the family.  Stephen [name] immediately expressed that he was in significant pain in his back and on his skin (face, arms, neck) and he asked for medicine for relief.  He states he is frequently constipated and that he does not sleep at night due to pain.  We discussed a brief life review of the patient. Per Stephen [name], he was born in [place].  He has a 7th grade education.  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  He was married/divorced and  had 1 daughter.   He moved back to [place] in order to spend more time with his grand children.  He reports that he currently lives alone in [place] and is able to drive.  He mentions that he used to have quite a history of alcohol and cocaine use.    As far as functional and nutritional status Stephen [name] is now a 2+ max assist for bed mobility.  He is unable to ambulate.  He has had a 50 lb weight loss over the last several years and appears malnourished with an albumin of 2.7.  We discussed the possibility that his illness may be due to cancer.  Stephen [name] commented that he hasn't smoked in 20 years so he didn't think it was cancer.  I explained that there are many different types of cancer.  We currently are not seeing cancer growth in his lungs but rather imaging shows it around his spine.  I commented that this is likely causing his back pain.  Stephen [name] was not convinced.  We discussed HCPOA.  Stephen [name] was clear that he does not want anyone making decisions for him.  I expressed that  if he does not choose someone that legally decision making would default to his daughter if he were unable to speak for himself.  He understood but seemed to feel uncomfortable burdening his daughter with such a responsibility.  We discussed code status.   If Stephen [name] arrests he requests DNR.  However, if he has not arrested he would like to treat the treatable and he would like to be comfortable.   When asked if you arrested what would you like for [place] to do?   He comments, "burn my ass up"   I verified that he meant he wanted to be cremated and he agreed.  We discussed changes to his pain medications and symptom management and I agreed Palliative will see him again tomorrow.  I spoke with his daughter, Stephen [name], on the phone - she confirmed DNR.   We talked at length about her father, her care of him and recent experiences they've had together.  She believes he continues to smoke and drink   She believes he  will not want chemo or radiation as he is very suspicious of the medical system.  She is very concerned about his disposition.  Primary Decision Maker:  PATIENT    SUMMARY OF RECOMMENDATIONS    Change code status to DNR.   If he arrests, then DNR.  If he is declining please continue to treat the treatable. Pain:  Percocet 5-325 q 6 hours.   Oxycodone 5 mg PRN q 6 hours Constipation:  Senna 2 tabs at bedtime. Skin:  Will add triamcinolone cream bid. Stephen [name] will need on-going PMT follow up in order to explain diagnoses / assist with decision making and follow up on symptom management.  Code Status/Advance Care Planning:  DNR - [name] form complete and on chart.   Psycho-social/Spiritual:   Desire for further Chaplaincy support: not at this time.  He is [place], but not overtly religious  Prognosis:   Unable to determine.  Will know more as hospitalization progresses.  Discharge Planning: to be determined but I anticipate SNF with Palliative.      Primary Diagnoses: Present on Admission: . SIRS (systemic inflammatory response syndrome) (HCC)   I have reviewed the medical record, interviewed the patient and family, and examined the patient. The following aspects are pertinent.  Past Medical History:  Diagnosis Date  . Diabetes mellitus without complication ([place])   . Major depressive disorder, single episode, unspecified 07/18/2017  . Psoriasis   . Skin abnormalities    Social History   Socioeconomic History  . Marital status: Divorced    Spouse name: Not on file  . Number of children: 1  . Years of education: Not on file  . Highest education level: 8th grade  Occupational History  . Occupation: Retired  Scientific laboratory technician  . Financial resource strain: Not hard at all  . Food insecurity:    Worry: Never true    Inability: Never true  . Transportation needs:    Medical: No    Non-medical: No  Tobacco Use  . Smoking status: Current Every Day Smoker     Packs/day: 1.00    Years: 65.00    Pack years: 65.00    Types: Cigarettes, Cigars  . Smokeless tobacco: Former Systems developer  . Tobacco comment: discussed options of quitting  Substance and Sexual Activity  . Alcohol use: Not Currently    Alcohol/week: 0.0 standard drinks  . Drug use: Not Currently  . Sexual activity: Not Currently  Lifestyle  .  Physical activity:    Days per week: 0 days    Minutes per session: 0 min  . Stress: Not at all  Relationships  . Social connections:    Talks on phone: Patient refused    Gets together: Patient refused    Attends religious service: Patient refused    Active member of club or organization: Patient refused    Attends meetings of clubs or organizations: Patient refused    Relationship status: Divorced  Other Topics Concern  . Not on file  Social History Narrative   Illiterate   Family History  Problem Relation Age of Onset  . Heart disease Mother   . Migraines Father   . Aneurysm Father   . Cancer Brother    Scheduled Meds: . diphenhydrAMINE  12.5 mg Intravenous Once  . feeding supplement (NEPRO CARB STEADY)  237 mL Oral BID BM  . insulin aspart  0-5 Units Subcutaneous QHS  . insulin aspart  0-9 Units Subcutaneous TID WC  . mouth rinse  15 mL Mouth Rinse BID  . multivitamin with minerals  1 tablet Oral Daily  . oxyCODONE-acetaminophen  1 tablet Oral Q6H  . senna  2 tablet Oral QHS  . triamcinolone 0.1 % cream : eucerin   Topical BID  . vitamin C  250 mg Oral BID   Continuous Infusions: . ceFEPime (MAXIPIME) IV 2 g (12/18/17 2108)  . dextrose 5 % and 0.9% NaCl 50 mL/hr at 12/18/17 1641  . vancomycin 1,000 mg (12/19/17 0301)   PRN Meds:.acetaminophen **OR** acetaminophen, baclofen, diphenhydrAMINE, ibuprofen, metoprolol tartrate, morphine injection, ondansetron **OR** ondansetron (ZOFRAN) IV, oxyCODONE, triamcinolone ointment No Known Allergies Review of Systems back pain, skin pain (face, neck, arms), constipation  Physical Exam   Thin, edentulous male, awake, alert, coherent, cooperative. CV slightly difficult to hear - no m/r/g Resp no distress, CTA Abdomen soft, nt, nd Skin plaques noted on neck, excoriation on body, mild swelling (anasarca?) noted in arms and hands  Vital Signs: BP 117/66 (BP Location: Right Arm)   Pulse 73   Temp 98.2 F (36.8 C) (Oral)   Resp 19   Ht 5\' 9"  (1.753 m)   Wt 40.8 kg   SpO2 100%   BMI 13.29 kg/m  Pain Scale: 0-10 POSS *See Group Information*: 1-Acceptable,Awake and alert Pain Score: 10-Worst pain ever   SpO2: SpO2: 100 % O2 Device:SpO2: 100 % O2 Flow Rate: .O2 Flow Rate (L/min): 2 L/min  IO: Intake/output summary:   Intake/Output Summary (Last 24 hours) at 12/19/2017 1004 Last data filed at 12/19/2017 3875 Gross per 24 hour  Intake 450.11 ml  Output 150 ml  Net 300.11 ml    LBM: Last BM Date: 12/18/17 Baseline Weight: Weight: 43.1 kg Most recent weight: Weight: 40.8 kg     Palliative Assessment/Data:30%     Time In: 9:30 Time Out: 10:40 Time Total: 70 min. Greater than 50%  of this time was spent counseling and coordinating care related to the above assessment and plan.  Signed by: Florentina Jenny, PA-C Palliative Medicine Pager: [phone]  Please contact Palliative Medicine Team phone at [phone] for questions and concerns.  For individual provider: See Shea Evans

## 2017-12-19 NOTE — Progress Notes (Signed)
PT Cancellation Note  Patient Details Name: Stephen [name]. MRN: [id number] DOB: Aug 25, 1945   Cancelled Treatment:    Reason Eval/Treat Not Completed: Patient at procedure or test/unavailable, will attempt PT visit at a future date/time as medically appropriate.    Linus Salmons PT, DPT 12/19/17, 11:25 AM

## 2017-12-19 NOTE — Progress Notes (Signed)
PT Cancellation Note  Patient Details Name: Stephen [name]. MRN: [id number] DOB: 09-May-1945   Cancelled Treatment:    Reason Eval/Treat Not Completed: Patient declined, no reason specified. Treatment attempted; pt declines due to 10/10 pain in his back. Pt notes he also does not "feel right in the head" due to medications. Of note, pt is able to converse without confusion. Re attempt at a later date.    Larae Grooms, PTA 12/19/2017, 2:49 PM

## 2017-12-19 NOTE — Consult Note (Signed)
Reason for consult is L4 compression fracture, pathologic Patient is seen for evaluation for possible plasty.  He is quite tender to percussion in his low lumbar spine around the L4 compression fracture radiographs and CT MRI were reviewed.  He has on exam some stocking glove diminished sensation from the knee down I suspect is related more from peripheral neuropathy than his severe stenosis.  He is able flex extend his toes.  Skin is intact. Unfortunately I think with the amount of tumor in the paravertebral space and the epidural space kyphoplasty has a significant chance of causing a complete block and acute cauda equina syndrome.  If he can have the agent treatment of his disease is amenable to that he could probably have kyphoplasty at a later date when there is less risk of acute neurologic compression.

## 2017-12-19 NOTE — Care Management Important Message (Signed)
Important Message  Patient Details  Name: Stephen [name]. MRN: [id number] Date of Birth: [birth year]   Medicare Important Message Given:  Yes    [name] [place] 12/19/2017, 11:07 AM

## 2017-12-19 NOTE — Progress Notes (Signed)
Chaplain responded   from palliative care consult list. Pt was completely uncovered (naked) and seem to be in considerable and itching  Pain. Pt answered question as appropiate. Chaplain wanted to begin to establish a relationship to continue visitation. Per notes Pt seems to not have accepted prognosis. Chaplain plan is to engage Pt and discuss his spiritual stance. Also to discuss feelings regarding prognosis.         12/19/17 1400  Clinical Encounter Type  Visited With Patient  Visit Type Initial  Referral From [facility] Needs Prayer;Emotional

## 2017-12-20 DIAGNOSIS — M549 Dorsalgia, unspecified: Secondary | ICD-10-CM

## 2017-12-20 DIAGNOSIS — M5440 Lumbago with sciatica, unspecified side: Secondary | ICD-10-CM

## 2017-12-20 DIAGNOSIS — R634 Abnormal weight loss: Secondary | ICD-10-CM

## 2017-12-20 DIAGNOSIS — M48061 Spinal stenosis, lumbar region without neurogenic claudication: Secondary | ICD-10-CM

## 2017-12-20 DIAGNOSIS — L989 Disorder of the skin and subcutaneous tissue, unspecified: Secondary | ICD-10-CM

## 2017-12-20 DIAGNOSIS — Z22321 Carrier or suspected carrier of Methicillin susceptible Staphylococcus aureus: Secondary | ICD-10-CM

## 2017-12-20 DIAGNOSIS — R627 Adult failure to thrive: Secondary | ICD-10-CM

## 2017-12-20 DIAGNOSIS — D649 Anemia, unspecified: Secondary | ICD-10-CM

## 2017-12-20 DIAGNOSIS — F1021 Alcohol dependence, in remission: Secondary | ICD-10-CM

## 2017-12-20 DIAGNOSIS — F17210 Nicotine dependence, cigarettes, uncomplicated: Secondary | ICD-10-CM

## 2017-12-20 DIAGNOSIS — E11620 Type 2 diabetes mellitus with diabetic dermatitis: Secondary | ICD-10-CM

## 2017-12-20 DIAGNOSIS — R531 Weakness: Secondary | ICD-10-CM

## 2017-12-20 DIAGNOSIS — D72829 Elevated white blood cell count, unspecified: Secondary | ICD-10-CM

## 2017-12-20 DIAGNOSIS — R509 Fever, unspecified: Secondary | ICD-10-CM

## 2017-12-20 DIAGNOSIS — Z7984 Long term (current) use of oral hypoglycemic drugs: Secondary | ICD-10-CM

## 2017-12-20 DIAGNOSIS — M8448XA Pathological fracture, other site, initial encounter for fracture: Secondary | ICD-10-CM

## 2017-12-20 DIAGNOSIS — R5383 Other fatigue: Secondary | ICD-10-CM

## 2017-12-20 DIAGNOSIS — K6812 Psoas muscle abscess: Secondary | ICD-10-CM

## 2017-12-20 LAB — GLUCOSE, CAPILLARY
GLUCOSE-CAPILLARY: 95 mg/dL (ref 70–99)
GLUCOSE-CAPILLARY: 167 mg/dL — AB (ref 70–99)
GLUCOSE-CAPILLARY: 222 mg/dL — AB (ref 70–99)
Glucose-Capillary: 165 mg/dL — ABNORMAL HIGH (ref 70–99)

## 2017-12-20 LAB — CBC
HCT: 27.5 % — ABNORMAL LOW (ref 40.0–52.0)
HEMOGLOBIN: 8.7 g/dL — AB (ref 13.0–18.0)
MCH: 25.9 pg — ABNORMAL LOW (ref 26.0–34.0)
MCHC: 31.6 g/dL — AB (ref 32.0–36.0)
MCV: 81.7 fL (ref 80.0–100.0)
Platelets: 374 10*3/uL (ref 150–440)
RBC: 3.37 MIL/uL — ABNORMAL LOW (ref 4.40–5.90)
RDW: 16.4 % — ABNORMAL HIGH (ref 11.5–14.5)
WBC: 20.7 10*3/uL — AB (ref 3.8–10.6)

## 2017-12-20 LAB — BASIC METABOLIC PANEL
Anion gap: 6 (ref 5–15)
BUN: 16 mg/dL (ref 8–23)
CALCIUM: 8.5 mg/dL — AB (ref 8.9–10.3)
CHLORIDE: 102 mmol/L (ref 98–111)
CO2: 28 mmol/L (ref 22–32)
CREATININE: 0.95 mg/dL (ref 0.61–1.24)
GFR calc non Af Amer: >60 mL/min (ref >60)
Glucose, Bld: 99 mg/dL (ref 70–99)
Potassium: 3.8 mmol/L (ref 3.5–5.1)
Sodium: 136 mmol/L (ref 135–145)

## 2017-12-20 LAB — PATHOLOGIST SMEAR REVIEW

## 2017-12-20 MED ORDER — ASPIRIN 325 MG PO TABS
325.0000 mg | ORAL_TABLET | Freq: Once | ORAL | Status: AC
  Administered 2017-12-20: 325 mg via ORAL
  Filled 2017-12-20: qty 1

## 2017-12-20 MED ORDER — NAPHAZOLINE-GLYCERIN 0.012-0.2 % OP SOLN
1.0000 [drp] | Freq: Two times a day (BID) | OPHTHALMIC | Status: DC
  Administered 2017-12-20: 1 [drp] via OPHTHALMIC
  Administered 2017-12-20: 2 [drp] via OPHTHALMIC
  Administered 2017-12-21: 1 [drp] via OPHTHALMIC
  Administered 2017-12-21 – 2017-12-22 (×2): 2 [drp] via OPHTHALMIC
  Administered 2017-12-22: 1 [drp] via OPHTHALMIC
  Administered 2017-12-23 – 2017-12-24 (×3): 2 [drp] via OPHTHALMIC
  Administered 2017-12-24: 1 [drp] via OPHTHALMIC
  Administered 2017-12-25 – 2017-12-27 (×3): 2 [drp] via OPHTHALMIC
  Filled 2017-12-20: qty 15

## 2017-12-20 MED ORDER — SENNA 8.6 MG PO TABS
1.0000 | ORAL_TABLET | Freq: Every day | ORAL | Status: DC
  Administered 2017-12-20 – 2017-12-23 (×3): 8.6 mg via ORAL
  Filled 2017-12-20 (×6): qty 1

## 2017-12-20 MED ORDER — OXYCODONE-ACETAMINOPHEN 5-325 MG PO TABS
1.0000 | ORAL_TABLET | ORAL | Status: DC | PRN
  Administered 2017-12-21 – 2017-12-27 (×13): 2 via ORAL
  Filled 2017-12-20 (×13): qty 2
  Filled 2017-12-20: qty 1

## 2017-12-20 MED ORDER — CAMPHOR-MENTHOL 0.5-0.5 % EX LOTN
TOPICAL_LOTION | CUTANEOUS | Status: DC | PRN
  Filled 2017-12-20: qty 222

## 2017-12-20 NOTE — Plan of Care (Signed)
  Problem: Education: Goal: Knowledge of General Education information will improve Description Including pain rating scale, medication(s)/side effects and non-pharmacologic comfort measures Outcome: Progressing   

## 2017-12-20 NOTE — Progress Notes (Signed)
Stephen Xu. is a 72 y.o. male with h/o DM dermatitis Is brought in by EMS \\with  generalized weakness, .  As per patient his neighbors called the paramedic because he was very weak and staying in bed.  He lives on his won. Has had back pain for months and takes advil OTC 2 tablets every 6 hrs.  He has lost 50 pounds of weight in an undefined period. Has had a skin condition for the past few years and has seen a dermatologist at [facility] in the past and was being treated as eczema VS psoriasis.  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 He says vodka was his drink and he was an alcoholic.  He has had baseline cough, poor appetite  Subjective More alert today Says he is hungry Itching persist   OBJECTIVE:  Patient Vitals for the past 24 hrs:  BP Temp Temp src Pulse Resp SpO2  12/20/17 0750 (!) 124/59 98.9 F (37.2 C) Oral 87 18 96 %  12/20/17 0629 - 98.6 F (37 C) - - - -  12/20/17 0357 (!) 118/57 (!) 101.2 F (38.4 C) Oral 97 - 94 %  12/19/17 2339 133/73 99.5 F (37.5 C) Oral (!) 103 15 99 %  12/19/17 1701 (!) 147/64 98.4 F (36.9 C) Oral 89 - 93 %   Physical Exam  Constitutional: Emaciated, frail, pale, fatigued  Cardiovascular:  s1s2 No murmur  Pulmonary/Chest:  B/l air entry decreased bases  Abdominal: Soft. There is no tenderness.  Musculoskeletal: pain back on moving Neurological: He is awake and alert today- .  Grossly non focal-not examined in detail  Skin:  Very dry skin over the palms with icthyosis and splitting of skin excoriations over the elbows              3 cm palpable, firm irregular lymphnodes in the rt axilla  B/l inguinal nodes palpable  Lab Results  CBC Latest Ref Rng & Units 12/20/2017 12/19/2017 12/17/2017  WBC 3.8 - 10.6 K/uL 20.7(H) 21.0(H) 19.7(H)  Hemoglobin 13.0 - 18.0 g/dL 8.7(L) 9.5(L) 9.8(L)  Hematocrit 40.0 - 52.0 % 27.5(L) 30.2(L) 29.1(L)  Platelets 150 - 440 K/uL 374 354 324    CMP  Latest Ref Rng & Units 12/20/2017 12/19/2017 12/17/2017  Glucose 70 - 99 mg/dL 99 123(H) 105(H)  BUN 8 - 23 mg/dL 16 16 11   Creatinine 0.61 - 1.24 mg/dL 0.95 1.03 0.86  Sodium 135 - 145 mmol/L 136 136 133(L)  Potassium 3.5 - 5.1 mmol/L 3.8 4.0 4.4  Chloride 98 - 111 mmol/L 102 103 98  CO2 22 - 32 mmol/L 28 26 29   Calcium 8.9 - 10.3 mg/dL 8.5(L) 8.5(L) 8.2(L)  Total Protein 6.5 - 8.1 g/dL - - -  Total Bilirubin 0.3 - 1.2 mg/dL - - -  Alkaline Phos 38 - 126 U/L - - -  AST 15 - 41 U/L - - -  ALT 0 - 44 U/L - - -    Micro Blood culture from 9/4 negative    Pathologic compression fracture of L4 with ventral epidural tumor and severe spinal stenosis. 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 Left  periaortic lymph node enlargement. Numerous additional lesions inthe lumbar spine. Findings are consistent with metastatic disease.  Favor prostate cancer given sclerotic appearance on x-ray. Recommend  correlation with PSA and rectal exam.  Mass lesion extends into the left psoas muscle. There is central  nonenhancing necrosis in the left psoas muscle most likely due to tumor. Psoas abscess could have a similar appearance.  Advanced multilevel degenerative change throughout the lumbar spine  as above.  Radiographs and labs were personally reviewed by me.    Assessment and Plan  72 y.o. male with h/o DM dermatitis Is brought in by EMS \\with  generalized weakness, .  Weight loss, fatigue,, Psoas muscle infiltration with Pathologic compression fracture of L4 with ventral epidural tumor and severe spinal stenosis. 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 -axillary LN  biopsied , spoke to pathologist- prelim likely  lymphoma- has been [facility] to [facility]    Fever with leucocytosis- likely  Due to lymphoma, but because he is colonized  with staph aureus , has skin lesions and a psoas muscle lesion wanted r/o psoas abscess Discussed with IR who said there is no fluid to drain .  As blood culture from 12/14/17 is negative, unlikely he could systemic infection or psoas abscess due to staph without the culture being positive . Also He was on vanco/zosyn and then vanco+ cefepime since his admission and has not responded. Day 6 of antibiotic Currently on Doxycycline as per Dr.Mody. Recommend dcing it and observing off antibiotic May repeat blood culture after 72 hrs off antibiotic if fever perisist    Failure to thrive   Dermatitis- D.D mycosis fungoides  Atopic eczema  Paraneoplastic dermatosis  [place] rash is less likely as it is not typical for it  Zinc deficiency with NME is a remote possibility but not typical  The last two in the differential because of malnutrition and alcoholism ( past)    Anemia- etiology multifactorial    DM- was taking metformin   Discussed with pharmacist-  Currently no infectious  issue to address. ID will sign off- call if needed

## 2017-12-20 NOTE — Clinical Social Work Placement (Signed)
   CLINICAL SOCIAL WORK PLACEMENT  NOTE  Date:  12/20/2017  Patient Details  Name: Stephen [name]. MRN: [id number] Date of Birth: 06/14/1945  Clinical Social Work is seeking post-discharge placement for this patient at the [place] level of care (*CSW will initial, date and re-position this form in  chart as items are completed):  Yes   Patient/family provided with [facility]'s list of facilities offering this level of care within the geographic area requested by the patient (or if unable, by the patient's family).  Yes   Patient/family informed of their freedom to choose among providers that offer the needed level of care, that participate in Medicare, Medicaid or managed care program needed by the patient, have an available bed and are willing to accept the patient.  Yes   Patient/family informed of [facility]'s ownership interest in [facility] and [facility], as well as of the fact that they are under no obligation to receive care at these facilities.  PASRR submitted to EDS on       PASRR number received on       Existing PASRR number confirmed on 12/20/17     FL2 transmitted to all facilities in geographic area requested by pt/family on 12/20/17     FL2 transmitted to all facilities within larger geographic area on       Patient informed that his/her managed care company has contracts with or will negotiate with certain facilities, including the following:            Patient/family informed of bed offers received.  Patient chooses bed at       Physician recommends and patient chooses bed at      Patient to be transferred to   on  .  Patient to be transferred to facility by       Patient family notified on   of transfer.  Name of family member notified:        PHYSICIAN       Additional Comment:    _______________________________________________ Regis Wiland, Veronia Beets, LCSW 12/20/2017, 5:30 PM

## 2017-12-20 NOTE — Progress Notes (Signed)
[facility] [place] NAME: Stephen [name]    MR#:  [id number]  DATE OF BIRTH:  21-Jun-1945  SUBJECTIVE:   tired of being in the hospital wants to leave  REVIEW OF SYSTEMS:    Review of Systems  Constitutional: Negative for fever, chills weight loss HENT: Negative for ear pain, nosebleeds, congestion, facial swelling, rhinorrhea, neck pain, neck stiffness and ear discharge.   Respiratory: Negative for cough, shortness of breath, wheezing  Cardiovascular: Negative for chest pain, palpitations and leg swelling.  Gastrointestinal: Negative for heartburn, abdominal pain, vomiting, diarrhea or consitpation Genitourinary: Negative for dysuria, urgency, frequency, hematuria Musculoskeletal: Negative for back pain or joint pain Neurological: Negative for dizziness, seizures, syncope, focal weakness,  numbness and headaches.  Hematological: Does not bruise/bleed easily.  Psychiatric/Behavioral: Negative for hallucinations, confusion, dysphoric mood    Tolerating Diet: yes      DRUG ALLERGIES:  No Known Allergies  VITALS:  Blood pressure (!) 124/59, pulse 87, temperature 98.9 F (37.2 C), temperature source Oral, resp. rate 18, height 5\' 9"  (1.753 m), weight 40.8 kg, SpO2 96 %.  PHYSICAL EXAMINATION:  Constitutional: Appears thin and frail no distress. HENT: Normocephalic. Marland Kitchen Oropharynx is clear and moist.  Eyes: Conjunctivae and EOM are normal. PERRLA, no scleral icterus.  Neck: Normal ROM. Neck supple. No JVD. No tracheal deviation. CVS: RRR, S1/S2 +, no murmurs, no gallops, no carotid bruit.  Pulmonary: Effort and breath sounds normal, no stridor, rhonchi, wheezes, rales.  Abdominal: Soft. BS +,  no distension, tenderness, rebound or guarding.  Musculoskeletal: Normal range of motion. No edema and no tenderness.  Neuro: Alert. CN 2-12 grossly intact. No focal deficits. Skin: Skin is warm and dry. No rash noted. Psychiatric: Normal mood  and affect.      LABORATORY PANEL:   CBC Recent Labs  Lab 12/20/17 0501  WBC 20.7*  HGB 8.7*  HCT 27.5*  PLT 374   ------------------------------------------------------------------------------------------------------------------  Chemistries  Recent Labs  Lab 12/14/17 1045  12/16/17 0429  12/20/17 0501  NA 139   < > 134*   < > 136  K 3.1*   < > 4.0   < > 3.8  CL 100   < > 99   < > 102  CO2 27   < > 29   < > 28  GLUCOSE 88   < > 84   < > 99  BUN 11   < > 14   < > 16  CREATININE 0.88   < > 0.85   < > 0.95  CALCIUM 8.2*   < > 8.0*   < > 8.5*  MG 1.4*  --  2.3  --   --   AST 23  --   --   --   --   ALT 9  --   --   --   --   ALKPHOS 197*  --   --   --   --   BILITOT 0.8  --   --   --   --    < > = values in this interval not displayed.   ------------------------------------------------------------------------------------------------------------------  Cardiac Enzymes Recent Labs  Lab 12/14/17 1045  TROPONINI 0.03*   ------------------------------------------------------------------------------------------------------------------  RADIOLOGY:  [place] Core Biopsy (lymph Nodes)  Result Date: 12/19/2017 INDICATION: No known primary, now with imaging findings worrisome for advanced metastatic disease. Please perform ultrasound-guided biopsy of right axillary nodal conglomeration for tissue diagnostic purposes. EXAM: ULTRASOUND-GUIDED RIGHT AXILLARY  LYMPH NODE BIOPSY COMPARISON:  CT of the chest, abdomen and pelvis - 12/16/2017 MEDICATIONS: None ANESTHESIA/SEDATION: Moderate (conscious) sedation was employed during this procedure. A total of Versed 2 mg and Fentanyl 100 mcg was administered intravenously. Moderate Sedation Time: 21 minutes. The patient's level of consciousness and vital signs were monitored continuously by radiology nursing throughout the procedure under my direct supervision. COMPLICATIONS: None immediate. TECHNIQUE: Informed written consent was obtained from  the patient after a discussion of the risks, benefits and alternatives to treatment. Questions regarding the procedure were encouraged and answered. Initial ultrasound scanning demonstrated multiple enlarged right axillary lymph nodes. An ultrasound image was saved for documentation purposes. The procedure was planned. A timeout was performed prior to the initiation of the procedure. The operative was prepped and draped in the usual sterile fashion, and a sterile drape was applied covering the operative field. A timeout was performed prior to the initiation of the procedure. Local anesthesia was provided with 1% lidocaine with epinephrine. Under direct ultrasound guidance, an 18 gauge core needle device was utilized to obtain to obtain 6 core needle biopsies of the dominant right axillary nodal conglomeration. The samples were placed in saline and submitted to pathology. The needle was removed and hemostasis was achieved with manual compression. Post procedure scan was negative for significant hematoma. A dressing was placed. The patient tolerated the procedure well without immediate postprocedural complication. IMPRESSION: Technically successful ultrasound guided biopsy of dominant right axillary nodal conglomeration. Electronically Signed   By: Sandi Mariscal M.D.   On: 12/19/2017 15:03     ASSESSMENT AND PLAN:   72 year old male with a history of depression and diabetes who is brought due to generalized weakness and back pain  1.  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 Rudene Christians. amount of tumor in the paravertebral space and the epidural space kyphoplasty has a significant chance of causing a complete block and acute cauda equina syndrome.   2.  Sepsis with left psoas muscle edema with fevers Continue Doxy as per RECS by ID ID following and has recommended IR aspiration as recommended by ID. Order placed  for IR for aspiration.   3.  Metastatic disease of unknown primary: Patient had successful ultrasound-guided biopsy of right axillary lymph node by IR  Follow-up on pathology  Oncology evaluation appreciated.    4.  Anemia of chronic disease: Continue to monitor hemoglobin  5.  Protein calorie malnutrition: Continue nutritional supplements Patient is DNR and will have outpatient palliative care follow-up Palliative care consultation appreciated     Management plans discussed with the patient and he is in agreement.  CODE STATUS: DNR  TOTAL TIME TAKING CARE OF THIS PATIENT: 22 minutes.   PHYSICAL therapy recommending home health at discharge  POSSIBLE D/C 1 to 2 days DEPENDING ON CLINICAL CONDITION.   Chinita Schimpf M.D on 12/20/2017 at 11:04 AM  Between 7am to 6pm - Pager - [phone] After 6pm go to [email or web address] - password [email or web address] [facility]  Office  [phone]  CC: Primary care physician; Glean Hess, MD  Note: This dictation was prepared with Dragon dictation along with smaller phrase technology. Any transcriptional errors that result from this process are unintentional.

## 2017-12-20 NOTE — Progress Notes (Signed)
Patient ID: Stephen [name]., male   DOB: 1945/12/07, 72 y.o.   MRN: [id number]   Asked to evaluate left psoas muscle for possible aspiration.   No demonstrable fluid collection in muscle at this time to allow for appropriate aspiration.  Mostly edematous changes which could be reactive from adjacent metastatic disease.  Will hold off on aspiration at this time.  If further imaging suggests a developing abscess can pursue at that time.   Discussion with Dr. Benjie Karvonen

## 2017-12-20 NOTE — NC FL2 (Signed)
[place] LEVEL OF CARE SCREENING TOOL     IDENTIFICATION  Patient Name: Stephen [name]. Birthdate: [birth year] Sex: male Admission Date (Current Location): 12/14/2017  [place] and [place] Number:  Engineering geologist and Address:  [facility], [address], [place], [place] [address]      Provider Number: [id number]  Attending Physician Name and Address:  Bettey Costa, MD  Relative Name and Phone Number:       Current Level of Care: Hospital Recommended Level of Care: [place] Prior Approval Number:    Date Approved/Denied:   PASRR Number: [id number])  Discharge Plan: SNF    Current Diagnoses: Patient Active Problem List   Diagnosis Date Noted  . Back pain   . Metastatic disease ([place])   . Paraspinous mass   . Sepsis ([place])   . Palliative care encounter   . Pressure injury of skin 12/17/2017  . SIRS (systemic inflammatory response syndrome) ([place]) 12/14/2017  . Situational insomnia 07/18/2017  . Erythematous condition 07/18/2017  . Hypomagnesemia 07/08/2016  . 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 PVD (peripheral vascular disease) ([place]) 11/17/2015  . Hx of falling 11/17/2015  . Protein-calorie malnutrition, severe 11/09/2015  . Alcoholism in remission ([place]) 09/12/2015  . Duodenitis 05/06/2015  . Hyponatremia 12/15/2014  . Thrombocytopenia ([place]) 12/15/2014  . Malnutrition ([place]) 12/15/2014  . Tobacco abuse 12/15/2014  . Cholelithiasis 12/15/2014  . Diabetes mellitus type 2, uncontrolled ([place]) 09/18/2014  . Psoriasis 09/16/2014  . Excessive weight loss 09/16/2014    Orientation RESPIRATION BLADDER Height & Weight     Self, Time, Situation, Place  Normal Continent Weight: 90 lb (40.8 kg) Height:  5\' 9"  (175.3 cm)  BEHAVIORAL SYMPTOMS/MOOD NEUROLOGICAL BOWEL NUTRITION STATUS      Incontinent Diet(Diet: Carb Modified. )  AMBULATORY STATUS COMMUNICATION OF NEEDS Skin   Extensive Assist Verbally PU Stage and  Appropriate Care(pressure ulcer stage 2 on buttocks. )                       Personal Care Assistance Level of Assistance  Bathing, Feeding, Dressing Bathing Assistance: Limited assistance Feeding assistance: Independent Dressing Assistance: Limited assistance     Functional Limitations Info  Sight, Hearing, Speech Sight Info: Adequate Hearing Info: Adequate Speech Info: Adequate    SPECIAL CARE FACTORS FREQUENCY  PT (By licensed PT), OT (By licensed OT)     PT Frequency: (5) OT Frequency: (5)            Contractures      Additional Factors Info  Code Status, Allergies Code Status Info: (DNR ) Allergies Info: (No Known Allergies. )           Current Medications (12/20/2017):  This is the current hospital active medication list Current Facility-Administered Medications  Medication Dose Route Frequency Provider Last Rate Last Dose  . acetaminophen (TYLENOL) tablet 650 mg  650 mg Oral Q6H PRN Henreitta Leber, MD   650 mg at 12/20/17 0420   Or  . acetaminophen (TYLENOL) suppository 650 mg  650 mg Rectal Q6H PRN Henreitta Leber, MD   650 mg at 12/16/17 0902  . baclofen (LIORESAL) tablet 10 mg  10 mg Oral TID PRN Henreitta Leber, MD      . camphor-menthol Timoteo Ace) lotion   Topical PRN Dellinger, Bobby Rumpf, PA-C      . dextrose 5 %-0.9 % sodium chloride infusion   Intravenous Continuous Fritzi Mandes, MD 50 mL/hr at 12/18/17  1641    . diphenhydrAMINE (BENADRYL) capsule 25 mg  25 mg Oral Q6H PRN Henreitta Leber, MD   25 mg at 12/19/17 1206  . diphenhydrAMINE (BENADRYL) injection 12.5 mg  12.5 mg Intravenous Once Harrie Foreman, MD      . doxycycline (VIBRA-TABS) tablet 100 mg  100 mg Oral Q12H Bettey Costa, MD   100 mg at 12/20/17 0947  . enoxaparin (LOVENOX) injection 30 mg  30 mg Subcutaneous Q24H Hallaji, Sheema M, RPH      . feeding supplement (NEPRO CARB STEADY) liquid 237 mL  237 mL Oral BID BM Mody, Sital, MD   237 mL at 12/20/17 1317  . ibuprofen  (ADVIL,MOTRIN) tablet 400 mg  400 mg Oral Q6H PRN Vaughan Basta, MD   400 mg at 12/18/17 2019  . insulin aspart (novoLOG) injection 0-5 Units  0-5 Units Subcutaneous QHS Sainani, Vivek J, MD      . insulin aspart (novoLOG) injection 0-9 Units  0-9 Units Subcutaneous TID WC Henreitta Leber, MD   2 Units at 12/20/17 1216  . MEDLINE mouth rinse  15 mL Mouth Rinse BID Bettey Costa, MD   15 mL at 12/19/17 2129  . metoprolol tartrate (LOPRESSOR) injection 5 mg  5 mg Intravenous Q4H PRN Bettey Costa, MD   5 mg at 12/17/17 1619  . morphine 2 MG/ML injection 2 mg  2 mg Intravenous Q4H PRN Gladstone Lighter, MD   2 mg at 12/19/17 0555  . multivitamin with minerals tablet 1 tablet  1 tablet Oral Daily Bettey Costa, MD   1 tablet at 12/20/17 1215  . naphazoline-glycerin (CLEAR EYES REDNESS) ophth solution 1-2 drop  1-2 drop Both Eyes BID Dellinger, Haynes Dage L, PA-C      . ondansetron (ZOFRAN) tablet 4 mg  4 mg Oral Q6H PRN Henreitta Leber, MD       Or  . ondansetron (ZOFRAN) injection 4 mg  4 mg Intravenous Q6H PRN Henreitta Leber, MD      . oxyCODONE-acetaminophen (PERCOCET/ROXICET) 5-325 MG per tablet 1-2 tablet  1-2 tablet Oral Q4H PRN Bettey Costa, MD      . senna (SENOKOT) tablet 8.6 mg  1 tablet Oral QHS Dellinger, Marianne L, PA-C      . triamcinolone ointment (KENALOG) 0.1 % 2 application  2 application Topical BID PRN Dellinger, Haynes Dage L, PA-C      . vitamin C (ASCORBIC ACID) tablet 250 mg  250 mg Oral BID Bettey Costa, MD   250 mg at 12/20/17 6659     Discharge Medications: Please see discharge summary for a list of discharge medications.  Relevant Imaging Results:  Relevant Lab Results:   Additional Information (SSN: [id number])  Airianna Kreischer, Veronia Beets, LCSW

## 2017-12-20 NOTE — Progress Notes (Signed)
Daily Progress Note   Patient Name: Stephen [name].       Date: 12/20/2017 DOB: 1945-06-14  Age: 72 y.o. MRN#: [id number] Attending Physician: Bettey Costa, MD Primary Care Physician: Glean Hess, MD Admit Date: 12/14/2017  Reason for Consultation/Follow-up: Establishing goals of care, Non pain symptom management, Pain control and Psychosocial/spiritual support  Subjective: Reviewed notes and imaging.  Talked with patient at bedside.  His meal is untouched.  During our conversation he is continually scratching his arms, neck and eyes.  He is awake and alert but at times tangential and mildly confused.  He feels as though the doctors are uncertain of what is wrong with him and he is being experimented on.     Stephen [name] tells me his plans are to get out of here, put on a little weight by eating a little more and to get back out into the world by walking.    I explained that I was hoping for the best for him, but that I always liked to have a back up plan in case the worst scenario happens.  I asked - if it does turn out that your illness is being caused by cancer, then what would you like to do?  Would you like to pursue treatment?  Or would you like to stay away from the medical system and go home to be comfortable with hospice?  After thinking for a moment, Stephen [name] stated that he would 1st like to attempt treatment - full force.    We talked for a while about his physical symptoms and his philosophy about life.  He states the pain medication helps until it runs out.    Assessment: Patient is DNR.  At this point wants full scope treatment.  Pain control is improved but he may benefit from a long acting medication.  Lymph node biopsy pathology pending.  Necrotic psoas lesion is unable  to be aspirated as there is not a fluid collection to drain (metastasis suspected).  Patient is eating very little.  Unable to stand or ambulate on his own.   Patient Profile/HPI:  72 y.o. male  with past medical history of polysubstance abuse, psoriasis, diabetes mellitus, depression, and malnutrition who was admitted on 12/14/2017 with sepsis and back pain.  Work up revealed metastatic disease in the  area of his lumbar spine and an L4 compression fracture.  His LDH was elevated and there is a concern for malignancy.  Length of Stay: 6  Current Medications: Scheduled Meds:  . diphenhydrAMINE  12.5 mg Intravenous Once  . doxycycline  100 mg Oral Q12H  . enoxaparin (LOVENOX) injection  30 mg Subcutaneous Q24H  . feeding supplement (NEPRO CARB STEADY)  237 mL Oral BID BM  . insulin aspart  0-5 Units Subcutaneous QHS  . insulin aspart  0-9 Units Subcutaneous TID WC  . mouth rinse  15 mL Mouth Rinse BID  . multivitamin with minerals  1 tablet Oral Daily  . naphazoline-glycerin  1-2 drop Both Eyes BID  . senna  1 tablet Oral QHS  . vitamin C  250 mg Oral BID    Continuous Infusions: . dextrose 5 % and 0.9% NaCl 50 mL/hr at 12/18/17 1641    PRN Meds: acetaminophen **OR** acetaminophen, baclofen, camphor-menthol, diphenhydrAMINE, ibuprofen, metoprolol tartrate, morphine injection, ondansetron **OR** ondansetron (ZOFRAN) IV, oxyCODONE-acetaminophen, triamcinolone ointment  Physical Exam        Thin frail man, lying in bed in NAD, awake, appropriate, mildly confused at times. CV rrr resp no distress Abdomen soft, nd, nt  Vital Signs: BP (!) 124/59 (BP Location: Right Arm)   Pulse 87   Temp 98.9 F (37.2 C) (Oral)   Resp 18   Ht 5\' 9"  (1.753 m)   Wt 40.8 kg   SpO2 96%   BMI 13.29 kg/m  SpO2: SpO2: 96 % O2 Device: O2 Device: Room Air O2 Flow Rate: O2 Flow Rate (L/min): 2 L/min  Intake/output summary:   Intake/Output Summary (Last 24 hours) at 12/20/2017 1326 Last data filed at  12/20/2017 1031 Gross per 24 hour  Intake 360 ml  Output 475 ml  Net -115 ml   LBM: Last BM Date: 12/18/17 Baseline Weight: Weight: 43.1 kg Most recent weight: Weight: 40.8 kg       Palliative Assessment/Data: 30%      Patient Active Problem List   Diagnosis Date Noted  . Metastatic disease ([place])   . Paraspinous mass   . Sepsis ([place])   . Palliative care encounter   . Pressure injury of skin 12/17/2017  . SIRS (systemic inflammatory response syndrome) ([place]) 12/14/2017  . Situational insomnia 07/18/2017  . Erythematous condition 07/18/2017  . Hypomagnesemia 07/08/2016  . 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 PVD (peripheral vascular disease) ([place]) 11/17/2015  . Hx of falling 11/17/2015  . Protein-calorie malnutrition, severe 11/09/2015  . Alcoholism in remission ([place]) 09/12/2015  . Duodenitis 05/06/2015  . Hyponatremia 12/15/2014  . Thrombocytopenia ([place]) 12/15/2014  . Malnutrition ([place]) 12/15/2014  . Tobacco abuse 12/15/2014  . Cholelithiasis 12/15/2014  . Diabetes mellitus type 2, uncontrolled ([place]) 09/18/2014  . Psoriasis 09/16/2014  . Excessive weight loss 09/16/2014    Palliative Care Plan    Recommendations/Plan: Eye drops bid for comfort/itch D/c triamcinolone cream (if this is fungus).  Will start sarna lotion. Will confer with attending physician regarding pain medications.  Concerned fentanyl patch may be too strong.  Available MS contin or Oxycontin may also be too strong.  Will continue with PRN percocet.   Goals of Care and Additional Recommendations: Limitations on Scope of Treatment: Full Scope Treatment  Code Status:  DNR  Prognosis:  Unable to determine  More than likely less than 6 months due to rapid recent decline.  No longer able to walk, eating very little, most likely wide spread metastatic cancer.  Uncertain of treatment options at this point.  Discharge Planning: To Be Determined  Per daughter, [name], patient would be willing to go to [facility].  Care plan was discussed with attending, daughter, pharmacy.  Thank you for allowing the Palliative Medicine Team to assist in the care of this patient.  Total time spent:  45 min.     Greater than 50%  of this time was spent counseling and coordinating care related to the above assessment and plan.  Florentina Jenny, PA-C Palliative Medicine  Please contact Palliative MedicineTeam phone at [phone] for questions and concerns between 7 am - 7 pm.   Please see AMION for individual provider pager numbers.

## 2017-12-20 NOTE — Consult Note (Signed)
[facility] Nurse wound consult note Patches to lower back/buttocks suggestive of fungal involvement.  Cracking and dry skin to hands.  All areas of long term duration.  Please see Dr. Gwenevere Ghazi note from 12/19/17.  Patient evaluated in [facility] 145.  No family present. Plan of care:  Topicals are already on order per the [facility].  Please follow those. Monitor the wound area(s) for worsening of condition such as: Signs/symptoms of infection,  Increase in size,  Development of or worsening of odor, Development of pain, or increased pain at the affected locations.  Notify the medical team if any of these develop.  Thank you for the consult.  Discussed plan of care with the patient and bedside nurse.  [facility] nurse will not follow at this time.  Please re-consult the [employer] team if needed.  Val Riles, RN, MSN, CWOCN, CNS-BC, pager [phone]

## 2017-12-20 NOTE — Clinical Social Work Note (Signed)
Clinical Social Work Assessment  Patient Details  Name: Stephen [name]. MRN: [id number] Date of Birth: [birth year]  Date of referral:  12/20/17               Reason for consult:  Facility Placement                Permission sought to share information with:  Chartered certified accountant granted to share information::  Yes, Verbal Permission Granted  Name::      [place]::   [place]   Relationship::     Contact Information:     Housing/Transportation Living arrangements for the past 2 months:  [place] of Information:  Patient Patient Interpreter Needed:  None Criminal Activity/Legal Involvement Pertinent to Current Situation/Hospitalization:  No - Comment as needed Significant Relationships:  Adult Children Lives with:  Self Do you feel safe going back to the place where you live?  Yes Need for family participation in patient care:  Yes (Comment)  Care giving concerns:  Patient lives alone in [place].    Social Worker assessment / plan:  Holiday representative (CSW) reviewed chart and noted that PT has changed recommendation to SNF. CSW met with patient alone at bedside to discuss D/C plan. Patient was alert and oriented X3 and was laying in the bed. Patient reported that his normal doctor has him on a pain regiment that works however the doctor here does not follow it. CSW provided emotional support. Patient reported that he lives alone however he has a caregiver come in for 2 hours for 5 days per week. CSW explained SNF process and that [facility] will have to approve it. Patient reported that he has been to [facility] before and prefers to go back there. FL2 complete and faxed out. CSW will continue to follow and assist as needed.   Employment status:  Disabled (Comment on whether or not currently receiving Disability), Retired Nurse, adult PT Recommendations:  Home with [employer], [place] / Referral to community resources:  [place]  Patient/Family's Response to care:  Patient is agreeable to SNF search in [place].   Patient/Family's Understanding of and Emotional Response to Diagnosis, Current Treatment, and Prognosis:  Patient was very pleasant and thanked CSW for assistance.   Emotional Assessment Appearance:  Appears stated age Attitude/Demeanor/Rapport:    Affect (typically observed):  Accepting, Adaptable, Pleasant Orientation:  Oriented to Self, Oriented to Place, Oriented to  Time, Oriented to Situation Alcohol / Substance use:  Not Applicable Psych involvement (Current and /or in the community):  No (Comment)  Discharge Needs  Concerns to be addressed:  Discharge Planning Concerns Readmission within the last 30 days:  No Current discharge risk:  Dependent with Mobility Barriers to Discharge:  Continued Medical Work up   [employer], Veronia Beets, LCSW 12/20/2017, 5:30 PM

## 2017-12-20 NOTE — Progress Notes (Signed)
Physical Therapy Treatment Patient Details Name: Stephen [name]. MRN: [id number] DOB: 06-13-1945 Today's Date: 12/20/2017    History of Present Illness Patient is a 72 year old male admitted for back pain and sepsis.  PMH includes major depressive disorder, DM and psoriasis.    PT Comments    Pt on the phone with a friend upon arrival asking his friend to bring in medications from home (possibly Excedrine Migraine) which friend agreed to do.  Educated pt on not taking medication not approved by MD/RN at this time but he shook his head stating "I've got to have something."  Discussed with RN who stated she is unable to give any more pain medication at this time and made her aware of medications being brought in by friends today.    Pt reports being in significant pain today limiting mobility.  He does request assistance drinking juice.  Pt laying low in the bed and was assisted to EOB with min guard/assist x 1 due to pain to drink but he promptly returned to side lying due to pain.    Pain remains limited by pain.  While pt did well on a previous therapy session he has had limited mobility since.  Pt will need increased support if he is discharged home and SNF may need to be considered if mobility does not resume prior to discharge.   Follow Up Recommendations  Home health PT;Supervision for mobility/OOB;Supervision/Assistance - 24 hour - other - see above     Equipment Recommendations       Recommendations for Other Services       Precautions / Restrictions Precautions Precautions: Fall Restrictions Weight Bearing Restrictions: No    Mobility  Bed Mobility Overal bed mobility: Needs Assistance Bed Mobility: Supine to Sit;Sit to Supine     Supine to sit: Min assist;Min guard Sit to supine: Min assist;Min guard      Transfers                 General transfer comment: unable due to pain  Ambulation/Gait                 Stairs              Wheelchair Mobility    Modified Rankin (Stroke Patients Only)       Balance Overall balance assessment: Needs assistance Sitting-balance support: Feet supported;No upper extremity supported Sitting balance-Leahy Scale: Fair Sitting balance - Comments: able to sit to drink orange juice                                    Cognition Arousal/Alertness: Awake/alert Behavior During Therapy: WFL for tasks assessed/performed Overall Cognitive Status: Within Functional Limits for tasks assessed                                        Exercises      General Comments        Pertinent Vitals/Pain Pain Assessment: 0-10 Pain Score: 10-Worst pain ever Pain Descriptors / Indicators: Aching;Grimacing;Constant Pain Intervention(s): Limited activity within patient's tolerance;Monitored during session;Patient requesting pain meds-RN notified    Home Living                      Prior Function  PT Goals (current goals can now be found in the care plan section) Progress towards PT goals: Not progressing toward goals - comment    Frequency    Min 2X/week      PT Plan Current plan remains appropriate    Co-evaluation              AM-PAC PT "6 Clicks" Daily Activity  Outcome Measure  Difficulty turning over in bed (including adjusting bedclothes, sheets and blankets)?: A Little Difficulty moving from lying on back to sitting on the side of the bed? : A Little Difficulty sitting down on and standing up from a chair with arms (e.g., wheelchair, bedside commode, etc,.)?: Unable Help needed moving to and from a bed to chair (including a wheelchair)?: A Lot Help needed walking in hospital room?: A Lot Help needed climbing 3-5 steps with a railing? : A Lot 6 Click Score: 13    End of Session   Activity Tolerance: Patient limited by pain Patient left: in bed;with bed alarm set;with call bell/phone within reach Nurse  Communication: Patient requests pain meds;Other (comment)       Time: 0076-2263 PT Time Calculation (min) (ACUTE ONLY): 11 min  Charges:  $Therapeutic Activity: 8-22 mins                     Chesley Noon, PTA 12/20/17, 9:24 AM

## 2017-12-21 ENCOUNTER — Inpatient Hospital Stay: Payer: Medicare Other

## 2017-12-21 ENCOUNTER — Encounter: Payer: Self-pay | Admitting: Pathology

## 2017-12-21 LAB — GLUCOSE, CAPILLARY
GLUCOSE-CAPILLARY: 185 mg/dL — AB (ref 70–99)
GLUCOSE-CAPILLARY: 120 mg/dL — AB (ref 70–99)
Glucose-Capillary: 123 mg/dL — ABNORMAL HIGH (ref 70–99)
Glucose-Capillary: 76 mg/dL (ref 70–99)

## 2017-12-21 LAB — CBC
HEMATOCRIT: 24.9 % — AB (ref 40.0–52.0)
Hemoglobin: 8 g/dL — ABNORMAL LOW (ref 13.0–18.0)
MCH: 26 pg (ref 26.0–34.0)
MCHC: 32 g/dL (ref 32.0–36.0)
MCV: 81.2 fL (ref 80.0–100.0)
PLATELETS: 358 10*3/uL (ref 150–440)
RBC: 3.07 MIL/uL — AB (ref 4.40–5.90)
RDW: 16.2 % — ABNORMAL HIGH (ref 11.5–14.5)
WBC: 19.1 10*3/uL — AB (ref 3.8–10.6)

## 2017-12-21 LAB — BASIC METABOLIC PANEL
Anion gap: 5 (ref 5–15)
BUN: 25 mg/dL — ABNORMAL HIGH (ref 8–23)
CHLORIDE: 106 mmol/L (ref 98–111)
CO2: 26 mmol/L (ref 22–32)
Calcium: 8.2 mg/dL — ABNORMAL LOW (ref 8.9–10.3)
Creatinine, Ser: 0.98 mg/dL (ref 0.61–1.24)
GFR calc non Af Amer: >60 mL/min (ref >60)
Glucose, Bld: 167 mg/dL — ABNORMAL HIGH (ref 70–99)
POTASSIUM: 3.7 mmol/L (ref 3.5–5.1)
SODIUM: 137 mmol/L (ref 135–145)

## 2017-12-21 MED ORDER — SODIUM CHLORIDE 0.9 % IV BOLUS
500.0000 mL | Freq: Once | INTRAVENOUS | Status: AC
  Administered 2017-12-21: 500 mL via INTRAVENOUS

## 2017-12-21 MED ORDER — NEPRO/CARBSTEADY PO LIQD
237.0000 mL | Freq: Three times a day (TID) | ORAL | Status: DC
  Administered 2017-12-21 – 2017-12-27 (×13): 237 mL via ORAL

## 2017-12-21 NOTE — Progress Notes (Signed)
[place] [facility] [place] NAME: Stephen [name]    MR#:  [id number]  DATE OF BIRTH:  11-30-1945  SUBJECTIVE:   patient with fever this am REVIEW OF SYSTEMS:    Review of Systems  Constitutional:++ fever weight loss HENT: Negative for ear pain, nosebleeds, congestion, facial swelling, rhinorrhea, neck pain, neck stiffness and ear discharge.   Respiratory: Negative for cough, shortness of breath, wheezing  Cardiovascular: Negative for chest pain, palpitations and leg swelling.  Gastrointestinal: Negative for heartburn, abdominal pain, vomiting, diarrhea or consitpation Genitourinary: Negative for dysuria, urgency, frequency, hematuria Musculoskeletal: Negative for back pain or joint pain Neurological: Negative for dizziness, seizures, syncope, focal weakness,  numbness and headaches.  Hematological: Does not bruise/bleed easily.  Psychiatric/Behavioral: Negative for hallucinations, confusion, dysphoric mood    Tolerating Diet: yes      DRUG ALLERGIES:  No Known Allergies  VITALS:  Blood pressure 123/69, pulse 81, temperature 98.4 F (36.9 C), temperature source Oral, resp. rate 18, height 5\' 9"  (1.753 m), weight 40.8 kg, SpO2 100 %.  PHYSICAL EXAMINATION:  Constitutional: Appears thin and frail no distress. HENT: Normocephalic. Marland Kitchen Oropharynx is clear and moist.  Eyes: Conjunctivae and EOM are normal. PERRLA, no scleral icterus.  Neck: Normal ROM. Neck supple. No JVD. No tracheal deviation. CVS: RRR, S1/S2 +, no murmurs, no gallops, no carotid bruit.  Pulmonary: Effort and breath sounds normal, no stridor, rhonchi, wheezes, rales.  Abdominal: Soft. BS +,  no distension, tenderness, rebound or guarding.  Musculoskeletal: Normal range of motion. No edema and no tenderness.  Neuro: Alert. CN 2-12 grossly intact. No focal deficits. Skin: Skin is warm and dry. No rash noted. Psychiatric: Normal mood and affect.      LABORATORY PANEL:    CBC Recent Labs  Lab 12/21/17 0513  WBC 19.1*  HGB 8.0*  HCT 24.9*  PLT 358   ------------------------------------------------------------------------------------------------------------------  Chemistries  Recent Labs  Lab 12/16/17 0429  12/21/17 0513  NA 134*   < > 137  K 4.0   < > 3.7  CL 99   < > 106  CO2 29   < > 26  GLUCOSE 84   < > 167*  BUN 14   < > 25*  CREATININE 0.85   < > 0.98  CALCIUM 8.0*   < > 8.2*  MG 2.3  --   --    < > = values in this interval not displayed.   ------------------------------------------------------------------------------------------------------------------  Cardiac Enzymes No results for input(s): TROPONINI in the last 168 hours. ------------------------------------------------------------------------------------------------------------------  RADIOLOGY:  Dg Chest 1 View  Result Date: 12/21/2017 CLINICAL DATA:  Fever. EXAM: CHEST  1 VIEW COMPARISON:  Chest x-ray dated 12/14/2017 and chest CT dated 12/16/2017 FINDINGS: The heart size and mediastinal contours are within normal limits. Both lungs are clear. No effusions. The multiple skeletal metastases seen on the prior exam are not apparent on this exam other than a slight pathologic compression fracture of the left side of the superior endplate of T6. IMPRESSION: 1. No acute abnormalities of the lungs. 2. Pathologic fracture of the T6 vertebral body. Electronically Signed   By: Lorriane Shire M.D.   On: 12/21/2017 09:10     ASSESSMENT AND PLAN:   72 year old male with a history of depression and diabetes who is brought due to generalized weakness and back pain  1.  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 Rudene Christians. amount of tumor in the paravertebral space and the epidural space kyphoplasty has a significant chance of causing a complete block and acute cauda equina syndrome.   2.   Sepsis with left psoas muscle edema with fevers Continue Doxy for MSSA from skin tag/debridement ID has signed off Fevers likely from underlying tumor/lymphoma?/ Ordered blood cx, UA and CXR this am  3.  Metastatic disease of unknown primary: Patient had successful ultrasound-guided biopsy of right axillary lymph node by IR  Follow-up on pathology  Oncology evaluation appreciated.    4.  Anemia of chronic disease: Continue to monitor hemoglobin  5.  Protein calorie malnutrition: Continue nutritional supplements  Patient is DNR and will have outpatient palliative care follow-up Palliative care consultation appreciated   D/w oncology to see patient tomorrow  Management plans discussed with the patient and he is in agreement.  CODE STATUS: DNR  TOTAL TIME TAKING CARE OF THIS PATIENT: 22 minutes.   PHYSICAL therapy recommending home health at discharge  POSSIBLE D/C 1 to 2 days DEPENDING ON CLINICAL CONDITION.   [name] M.D on 12/21/2017 at 12:19 PM  Between 7am to 6pm - Pager - [phone] After 6pm go to [email or web address] - password [email or web address] [facility]  Office  [phone]  CC: Primary care physician; Glean Hess, MD  Note: This dictation was prepared with Dragon dictation along with smaller phrase technology. Any transcriptional errors that result from this process are unintentional.

## 2017-12-21 NOTE — Progress Notes (Addendum)
MD Jannifer Franklin made awarwe of pt's temp. Of 102.1 and HR of 122 radial. Will give tylenol and follow up with MD.  Update: 0144, Temp. 102.1, HR 121, Motrin given. MD notified.  Update: 0246, Temp. 100.3, HR 113, after completion of 500 ml NS bolus. Will continue to monitor.

## 2017-12-21 NOTE — Plan of Care (Signed)
Patient is usually incontinent x2. Due to this fact, staff has been unable to collect for UA.  Will continue to attempt and send sample to lab as possible

## 2017-12-21 NOTE — Progress Notes (Signed)
Reviewed chart.  Noted fever of 102.  Talked with PT.  Patient walked 30 feet.  Talked with patient at bedside he is receiving a shave which he has been waiting on for days.  He is in no distress and reports that he is comfortable.  Goals:  DNR/DNI, full scope treatment  Will continue to follow with you.  I anticipate that once he receives his path results and Oncology has made recommendations further Palliative consultation may be helpful.  Florentina Jenny, PA-C Palliative Medicine Pager: [phone]   Time 15 min.

## 2017-12-21 NOTE — Progress Notes (Signed)
Physical Therapy Treatment Patient Details Name: Blakeley Margraf. MRN: [id number] DOB: [birth year] Today's Date: 12/21/2017    History of Present Illness Patient is a 72 year old male admitted for back pain and sepsis.  PMH includes major depressive disorder, DM and psoriasis.    PT Comments    Treatment initiated, but interrupted due to pt having a large bowel movement requiring nursing care; plan to return post personal hygiene for continue PT treatment.    Follow Up Recommendations        Equipment Recommendations       Recommendations for Other Services       Precautions / Restrictions Precautions Precautions: Fall Restrictions Weight Bearing Restrictions: No    Mobility  Bed Mobility               General bed mobility comments: Attempted, pt has large BM requiring nursing care  Transfers                    Ambulation/Gait                 Stairs             Wheelchair Mobility    Modified Rankin (Stroke Patients Only)       Balance                                            Cognition Arousal/Alertness: Awake/alert Behavior During Therapy: WFL for tasks assessed/performed Overall Cognitive Status: Within Functional Limits for tasks assessed                                 General Comments: Converses without confusion with relevant answers; but feels he has been tied up in the hospital at some point.       Exercises      General Comments        Pertinent Vitals/Pain Pain Assessment: 0-10 Pain Score: 8  Pain Location: [facility] Pain Intervention(s): Monitored during session;Premedicated before session    Home Living                      Prior Function            PT Goals (current goals can now be found in the care plan section)      Frequency           PT Plan      Co-evaluation              AM-PAC PT "6 Clicks" Daily Activity  Outcome Measure                   End of Session               Time: 6979-4801 PT Time Calculation (min) (ACUTE ONLY): 8 min  Charges:  $Therapeutic Activity: 8-22 mins                      Larae Grooms, PTA 12/21/2017, 11:33 AM

## 2017-12-21 NOTE — Care Management Important Message (Signed)
Important Message  Patient Details  Name: Stephen [name]. MRN: [id number] Date of Birth: [birth year]   Medicare Important Message Given:  Yes    [name] 12/21/2017, 10:45 AM

## 2017-12-21 NOTE — Plan of Care (Signed)
Patient educated about importance of working with PT to assess his current and future needs (for discharge).  Patient agreed to work with PT when needed  - so long as it puts him "closer to discharge."

## 2017-12-21 NOTE — Progress Notes (Signed)
Clinical Social Worker (CSW) presented bed offers to patient and he chose [facility]. Per Tammy admissions coordinator at [facility] she will start [facility] SNF authorization today.   CSW received a call from patient's daughter Narda Rutherford. CSW made [facility] aware of above. CSW discussed long term options and explained how to apply for medicaid. Janie requested that CSW send referrals to [facility], [facility] and [facility] explained that referrals can be sent however [facility] has already started SNF authorization and patient may be discharged in the next 1-2 days. Daughter verbalized her understanding and is okay with [facility] and still requested for referrals to be sent. Per daughter she lives in [place] and wants patient to be closer to her.    [facility]: Left voicemail [facility]: Faxed referral to admissions coordinator Kennyth Lose and spoke to her via telephone. Hal Morales: Left voicemail [facility]: Left voicemail   [employer], LCSW [phone]

## 2017-12-21 NOTE — Progress Notes (Addendum)
Physical Therapy Treatment Patient Details Name: Stephen [name]. MRN: [id number] DOB: 1945-11-14 Today's Date: 12/21/2017    History of Present Illness Patient is a 72 year old male admitted for back pain and sepsis.  PMH includes major depressive disorder, DM and psoriasis.    PT Comments    Pt agreeable to PT; continues with 8/10 back pain that does not worsen with movement/ambulation. Pt demonstrates Mod I in/out of bed with mild increased time.  STS transfers with rolling walker with Min guard for safety and cues for upright posture. Good use of hands. Pt ambulates 30 feet with poor upright posture and rolling walker held too far out (despite cues; requires physical assist for rolling walker placement) with inconsistent step to/partial step through pattern. Unsafe technique; however, no overt LOB. Pt participates in BLE exercises and given written program with education provided to family as well. Pt encouraged to sit in the chair after family provides pt with shave in bed; pt/family with call nursing for transfer. Spoke with palliative medicine post visit. Recommendation changed to skilled nursing facility to progress strength, endurance and safety with ambulation.   Follow Up Recommendations  SNF     Equipment Recommendations       Recommendations for Other Services       Precautions / Restrictions Precautions Precautions: Fall Restrictions Weight Bearing Restrictions: No    Mobility  Bed Mobility Overal bed mobility: Modified Independent Bed Mobility: Supine to Sit;Sit to Supine     Supine to sit: Modified independent (Device/Increase time) Sit to supine: Modified independent (Device/Increase time)   General bed mobility comments: Increased time, but does not require physical assist  Transfers Overall transfer level: Needs assistance Equipment used: Rolling walker (2 wheeled) Transfers: Sit to/from Stand Sit to Stand: Min guard         General transfer  comment: Slow to rise, good use of hands and good eccentric control with sit  Ambulation/Gait Ambulation/Gait assistance: Min guard;Min assist Gait Distance (Feet): 30 Feet Assistive device: Rolling walker (2 wheeled) Gait Pattern/deviations: Step-to pattern;Step-through pattern;Trunk flexed     General Gait Details: Poor stand posture with rw held too far out in front of pt despite cues requiring physical assist for safety. Inconsistent step to to partial step through pattern. No overt LOB; no increased back pain with stand/walk   Stairs             Wheelchair Mobility    Modified Rankin (Stroke Patients Only)       Balance Overall balance assessment: Needs assistance Sitting-balance support: Feet supported;No upper extremity supported Sitting balance-Leahy Scale: Fair     Standing balance support: Bilateral upper extremity supported Standing balance-Leahy Scale: Fair                              Cognition Arousal/Alertness: Awake/alert Behavior During Therapy: WFL for tasks assessed/performed Overall Cognitive Status: Within Functional Limits for tasks assessed                                 General Comments: Converses without confusion with relevant answers; but feels he has been tied up in the hospital at some point.       Exercises General Exercises - Lower Extremity Ankle Circles/Pumps: AROM;Both;10 reps;Seated Quad Sets: Strengthening;Both;10 reps Gluteal Sets: Strengthening;Both;10 reps Long Arc Quad: AROM;Both;10 reps;Seated Heel Slides: AROM;Both;10 reps Hip ABduction/ADduction: AROM;Both;10 reps  Hip Flexion/Marching: AROM;Both;10 reps    General Comments        Pertinent Vitals/Pain Pain Assessment: 0-10 Pain Score: 8  Pain Location: [facility] Pain Intervention(s): Monitored during session;Premedicated before session    Home Living                      Prior Function            PT Goals (current goals can  now be found in the care plan section) Progress towards PT goals: Progressing toward goals    Frequency    Min 2X/week      PT Plan Discharge plan needs to be updated    Co-evaluation              AM-PAC PT "6 Clicks" Daily Activity  Outcome Measure  Difficulty turning over in bed (including adjusting bedclothes, sheets and blankets)?: None Difficulty moving from lying on back to sitting on the side of the bed? : A Little Difficulty sitting down on and standing up from a chair with arms (e.g., wheelchair, bedside commode, etc,.)?: Unable Help needed moving to and from a bed to chair (including a wheelchair)?: A Little Help needed walking in hospital room?: A Little Help needed climbing 3-5 steps with a railing? : A Lot 6 Click Score: 16    End of Session Equipment Utilized During Treatment: Gait belt Activity Tolerance: Patient tolerated treatment well Patient left: in bed;with call bell/phone within reach;with bed alarm set;with family/visitor present Nurse Communication: Other (comment)(spoke with palliative medicine regarding visit) PT Visit Diagnosis: Unsteadiness on feet (R26.81);Muscle weakness (generalized) (M62.81)     Time: 0045-9977 PT Time Calculation (min) (ACUTE ONLY): 25 min  Charges:  $Therapeutic Exercise: 8-22 mins $Therapeutic Activity: 8-22 mins                      Larae Grooms, PTA 12/21/2017, 11:43 AM

## 2017-12-21 NOTE — Progress Notes (Signed)
Nutrition Follow-up  DOCUMENTATION CODES:   Severe malnutrition in context of chronic illness, Underweight  INTERVENTION:   - Increase Nepro Shake po to TID, each supplement provides 425 kcal and 19 grams protein  - Continue MVI with minerals daily  - Continue Vitamin C 250 mg po BID  - Dysphagia 2 diet (carb modified) per pt request  - Magic cup TID with meals, each supplement provides 290 kcal and 9 grams of protein  NUTRITION DIAGNOSIS:   Severe Malnutrition related to chronic illness (DM, depression) as evidenced by severe fat depletion, severe muscle depletion.  Ongoing, being addressed with oral nutrition supplements and diet modifications  GOAL:   Patient will meet greater than or equal to 90% of their needs  Progressing  MONITOR:   PO intake, Weight trends, Supplement acceptance, Skin, Labs, Other (GOC)  REASON FOR ASSESSMENT:   Malnutrition Screening Tool    ASSESSMENT:   72 y/o male with h/o DM and depression admitted with SIRS secondary to possible psoas muscle abscess   9/5 - MRI of spine revealed pathologic compression fracture of L4, ventral epidural tumor, paraspinous soft tissue tumor, numerous additional lesions to lumbar spine 9/6 - CT of chest, abdomen, and pelvis extensive metastatic disease 9/9 - s/p US guided biopsy of R axillary lymph node  Noted palliative care team following.  Discussed pt with RN who states pt is consuming approximately 50% of meals and 100% of Nepro supplements. RD to increase Nepro order to TID given pt's willingness to consume supplements.  Noted 25% completed breakfast meal tray at bedside at time of visit. Pt had eaten 1 piece of toast. Oatmeal and eggs were mixed together but appeared untouched. Nepro supplement at bedside that pt was drinking once awake.  Pt was sleeping at time of RD visit but awoke to voice and starting drinking the rest of his supplement. Pt states that he can't eat the food because it is "too  cold, hard, and dry." RD offered to downgrade diet back to dysphagia 2 for ease of chewing, and pt agreed. Will keep carb modified limitation per MD.  Once awakened, pt began itching arms, scalp, and R hand. RD attempted to redirect pt several times but was unsuccessful.  Meal Completion: 35-100%  Medications reviewed and include: Nepro BID, sliding scale Novolog, MVI with minerals daily, 250 mg vitamin C BID, Senokot daily  Labs reviewed: BUN 25 (H), hemoglobin 8.0 (L), HCT 24.9 (L) CBG's: 123, 222, 165, 167 x 24 hours  Diet Order:   Diet Order            DIET DYS 2 Room service appropriate? Yes; Fluid consistency: Thin  Diet effective now              EDUCATION NEEDS:   Education needs have been addressed  Skin:  Skin Assessment: Skin Integrity Issues: Stage II: L buttocks, documented 9/6 Incisions: R axilla Other: non-pressure wound to R hand  Last BM:  12/20/17 - large type 6  Height:   Ht Readings from Last 1 Encounters:  12/14/17 5\' 9"  (1.753 m)    Weight:   Wt Readings from Last 1 Encounters:  12/14/17 40.8 kg    Ideal Body Weight:  72.7 kg  BMI:  Body mass index is 13.29 kg/m.  Estimated Nutritional Needs:   Kcal:  1500-1700kcal/day   Protein:  61-70g/day   Fluid:  >1.0L/day     Gaynell Face, MS, RD, LDN Pager: [phone] Weekend/After Hours: [phone]

## 2017-12-22 ENCOUNTER — Inpatient Hospital Stay: Payer: Medicare Other

## 2017-12-22 LAB — URINALYSIS, ROUTINE W REFLEX MICROSCOPIC
Bacteria, UA: NONE SEEN
Bilirubin Urine: NEGATIVE
Glucose, UA: NEGATIVE mg/dL
Hgb urine dipstick: NEGATIVE
Ketones, ur: NEGATIVE mg/dL
Leukocytes, UA: NEGATIVE
NITRITE: NEGATIVE
PH: 5 (ref 5.0–8.0)
Protein, ur: 30 mg/dL — AB
SPECIFIC GRAVITY, URINE: 1.024 (ref 1.005–1.030)

## 2017-12-22 LAB — CBC
HEMATOCRIT: 26.9 % — AB (ref 40.0–52.0)
HEMOGLOBIN: 8.4 g/dL — AB (ref 13.0–18.0)
MCH: 25.6 pg — ABNORMAL LOW (ref 26.0–34.0)
MCHC: 31.4 g/dL — AB (ref 32.0–36.0)
MCV: 81.5 fL (ref 80.0–100.0)
Platelets: 415 10*3/uL (ref 150–440)
RBC: 3.3 MIL/uL — AB (ref 4.40–5.90)
RDW: 16 % — ABNORMAL HIGH (ref 11.5–14.5)
WBC: 21.3 10*3/uL — ABNORMAL HIGH (ref 3.8–10.6)

## 2017-12-22 LAB — GLUCOSE, CAPILLARY
GLUCOSE-CAPILLARY: 158 mg/dL — AB (ref 70–99)
GLUCOSE-CAPILLARY: 133 mg/dL — AB (ref 70–99)
Glucose-Capillary: 93 mg/dL (ref 70–99)
Glucose-Capillary: 117 mg/dL — ABNORMAL HIGH (ref 70–99)
Glucose-Capillary: 130 mg/dL — ABNORMAL HIGH (ref 70–99)

## 2017-12-22 LAB — BASIC METABOLIC PANEL
Anion gap: 9 (ref 5–15)
BUN: 20 mg/dL (ref 8–23)
CHLORIDE: 105 mmol/L (ref 98–111)
CO2: 26 mmol/L (ref 22–32)
Calcium: 8.5 mg/dL — ABNORMAL LOW (ref 8.9–10.3)
Creatinine, Ser: 0.96 mg/dL (ref 0.61–1.24)
GFR calc non Af Amer: >60 mL/min (ref >60)
Glucose, Bld: 110 mg/dL — ABNORMAL HIGH (ref 70–99)
POTASSIUM: 3.9 mmol/L (ref 3.5–5.1)
SODIUM: 140 mmol/L (ref 135–145)

## 2017-12-22 LAB — HEMOGLOBIN AND HEMATOCRIT, BLOOD
HCT: 23.7 % — ABNORMAL LOW (ref 40.0–52.0)
Hemoglobin: 7.7 g/dL — ABNORMAL LOW (ref 13.0–18.0)

## 2017-12-22 MED ORDER — FAMOTIDINE IN NACL 20-0.9 MG/50ML-% IV SOLN
20.0000 mg | Freq: Two times a day (BID) | INTRAVENOUS | Status: DC
  Administered 2017-12-22 – 2017-12-26 (×9): 20 mg via INTRAVENOUS
  Filled 2017-12-22 (×10): qty 50

## 2017-12-22 MED ORDER — SODIUM CHLORIDE 0.9 % IV SOLN
2.0000 g | Freq: Two times a day (BID) | INTRAVENOUS | Status: DC
  Administered 2017-12-23 – 2017-12-25 (×7): 2 g via INTRAVENOUS
  Filled 2017-12-22 (×9): qty 2

## 2017-12-22 MED ORDER — VANCOMYCIN HCL IN DEXTROSE 750-5 MG/150ML-% IV SOLN
750.0000 mg | INTRAVENOUS | Status: DC
  Administered 2017-12-22 – 2017-12-24 (×3): 750 mg via INTRAVENOUS
  Filled 2017-12-22 (×5): qty 150

## 2017-12-22 NOTE — Progress Notes (Addendum)
[facility] [place] NAME: Stephen [name]    MR#:  [id number]  DATE OF BIRTH:  June 19, 1945  SUBJECTIVE:   patient seen today Had vomiting Has generalized weakness Has fever REVIEW OF SYSTEMS:    Review of Systems  ConstituHas fever, has weight loss HENT: Negative for ear pain, nosebleeds, congestion, facial swelling, rhinorrhea, neck pain, neck stiffness and ear discharge.   Respiratory: Negative for cough, shortness of breath, wheezing  Cardiovascular: Negative for chest pain, palpitations and leg swelling.  Gastrointestinal: Negative for heartburn, abdominal pain,  diarrhea or consitpation Has vomiting Genitourinary: Negative for dysuria, urgency, frequency, hematuria Musculoskeletal: Negative for back pain or joint pain Neurological: Negative for dizziness, seizures, syncope, focal weakness,  numbness and headaches.  Hematological: Does not bruise/bleed easily.  Psychiatric/Behavioral: Negative for hallucinations, confusion, dysphoric mood  Tolerating Diet: yes   DRUG ALLERGIES:  No Known Allergies  VITALS:  Blood pressure 118/64, pulse 78, temperature 99 F (37.2 C), temperature source Oral, resp. rate 18, height 5\' 9"  (1.753 m), weight 40.8 kg, SpO2 99 %.  PHYSICAL EXAMINATION:  Constitutional: Appears thin and frail no distress. HENT: Normocephalic. Marland Kitchen Oropharynx is clear and moist.  Eyes: Conjunctivae and EOM are normal. PERRLA, no scleral icterus.  Neck: Normal ROM. Neck supple. No JVD. No tracheal deviation. CVS: RRR, S1/S2 +, no murmurs, no gallops, no carotid bruit.  Pulmonary: Effort and breath sounds normal, no stridor, rhonchi, wheezes, Scattered rales in left lung  Abdominal: Soft. BS +,  no distension, tenderness, rebound or guarding.  Musculoskeletal: Normal range of motion. No edema and no tenderness.  Neuro: Alert. CN 2-12 grossly intact. No focal deficits. Skin: Skin is warm and dry. No rash  noted. Psychiatric: Normal mood and affect.      LABORATORY PANEL:   CBC Recent Labs  Lab 12/22/17 0543  WBC 21.3*  HGB 8.4*  HCT 26.9*  PLT 415   ------------------------------------------------------------------------------------------------------------------  Chemistries  Recent Labs  Lab 12/16/17 0429  12/22/17 0543  NA 134*   < > 140  K 4.0   < > 3.9  CL 99   < > 105  CO2 29   < > 26  GLUCOSE 84   < > 110*  BUN 14   < > 20  CREATININE 0.85   < > 0.96  CALCIUM 8.0*   < > 8.5*  MG 2.3  --   --    < > = values in this interval not displayed.   ------------------------------------------------------------------------------------------------------------------  Cardiac Enzymes No results for input(s): TROPONINI in the last 168 hours. ------------------------------------------------------------------------------------------------------------------  RADIOLOGY:  Dg Chest 1 View  Result Date: 12/21/2017 CLINICAL DATA:  Fever. EXAM: CHEST  1 VIEW COMPARISON:  Chest x-ray dated 12/14/2017 and chest CT dated 12/16/2017 FINDINGS: The heart size and mediastinal contours are within normal limits. Both lungs are clear. No effusions. The multiple skeletal metastases seen on the prior exam are not apparent on this exam other than a slight pathologic compression fracture of the left side of the superior endplate of T6. IMPRESSION: 1. No acute abnormalities of the lungs. 2. Pathologic fracture of the T6 vertebral body. Electronically Signed   By: Lorriane Shire M.D.   On: 12/21/2017 09:10     ASSESSMENT AND PLAN:   72 year old male with a history of depression and diabetes who is brought due to generalized weakness and back pain  1.  Lower back pain due to vertebral metastatic disease noted on CT  scan on admission Continue supportive management He was evaluated by Dr. Rudene Christians. amount of tumor in the paravertebral space and the epidural space kyphoplasty has a significant chance of  causing a complete block and acute cauda equina syndrome.  2.  Sepsis with left psoas muscle edema with fevers Persistent leucocytosis Health care associated pneumonia IV fluids Start broad spectrum antibiotics vancomycin and cefepime ID f/u Unknown etiology for fevers Ordered blood cx, UA and CXR this am  3. Nausea and vomiting  coffee groound emesis Abdominal xray to check for ileus CXR to assess for pneumonia Monitor hb and hct Hold blood thinner meds  4.  Metastatic disease of unknown primary: Patient had successful ultrasound-guided biopsy of right axillary lymph node by IR  Follow-up on pathology  Oncology evaluation appreciated.    5.  Anemia of chronic disease: Continue to monitor hemoglobin  6.  Protein calorie malnutrition: Continue nutritional supplements  Patient is DNR and will have outpatient palliative care follow-up Palliative care consultation appreciated   Management plans discussed with the patient and he is in agreement.  CODE STATUS: DNR  TOTAL TIME TAKING CARE OF THIS PATIENT: 35 minutes.   PHYSICAL therapy recommending home health at discharge  POSSIBLE D/C 1 to 2 days DEPENDING ON CLINICAL CONDITION.   Saundra Shelling M.D on 12/22/2017 at 2:49 PM  Between 7am to 6pm - Pager - [phone] After 6pm go to [email or web address] - password [email or web address] [facility]  Office  [phone]  CC: Primary care physician; Glean Hess, MD  Note: This dictation was prepared with Dragon dictation along with smaller phrase technology. Any transcriptional errors that result from this process are unintentional.

## 2017-12-22 NOTE — Progress Notes (Signed)
PT Cancellation Note  Patient Details Name: Stephen [name]. MRN: [id number] DOB: 1945-08-03   Cancelled Treatment:    Reason Eval/Treat Not Completed: Other (comment). Treatment attempted, however pt soundly sleeping curled up on fetal position. Pt awoken long enough to respond to therapist that he does want to participate. Refused repositioning. RN notified.   Omni Dunsworth 12/22/2017, 2:11 PM  Greggory Stallion, PT, DPT [phone]

## 2017-12-22 NOTE — Progress Notes (Signed)
Pharmacy Antibiotic Note  Stephen [name]. is a 72 y.o. male admitted on 12/14/2017 with sepsis.  Pharmacy has been consulted for cefepime and vancomycin dosing.  Plan: Vancomycin 750 IV every 24 hours.  Goal trough 15-20 mcg/mL. Trough to be drawn prior to the fourth dose.  Cefepime 2 gm  IV every 12 hours  Height: 5\' 9"  (175.3 cm) Weight: 90 lb (40.8 kg) IBW/kg (Calculated) : 70.7  Temp (24hrs), Avg:99.4 F (37.4 C), Min:98 F (36.7 C), Max:101.3 F (38.5 C)  Recent Labs  Lab 12/17/17 0143 12/19/17 0218 12/20/17 0501 12/21/17 0513 12/22/17 0543  WBC 19.7* 21.0* 20.7* 19.1* 21.3*  CREATININE 0.86 1.03 0.95 0.98 0.96  VANCOTROUGH 12* 19  --   --   --     Estimated Creatinine Clearance: 39.5 mL/min (by C-G formula based on SCr of 0.96 mg/dL).    No Known Allergies  Antimicrobials this admission: Vancomycin 09/12 >>  Cefepime 09/12 >>    Microbiology results: 09/11 BCx: pending with NGTD <24 hours  UCx:    Sputum:    MRSA PCR:   Thank you for allowing pharmacy to be a part of this patient's care.  Forrest Moron 12/22/2017 4:05 PM

## 2017-12-22 NOTE — Progress Notes (Signed)
Report called to Hinton Dyer, RN on 1C.

## 2017-12-22 NOTE — Progress Notes (Signed)
Pt transferred to room 119, Pts daughter updated.

## 2017-12-22 NOTE — Progress Notes (Addendum)
Clinical Education officer, museum (CSW) received a call after 5 pm yesterday from [facility] admissions coordinator at [facility] stating that they have declined patient. CSW also received a call back from [facility] admissions coordinator at [employer] [facility] yesterday after 5 pm. CSW faxed referral to [facility] this morning. CSW will continue to follow and assist as needed.   CSW received call back from [place] who stated she will need to know patient's cancer treatment plan before making an offer.   [employer], LCSW [phone]

## 2017-12-22 NOTE — Progress Notes (Signed)
Per [facility] liaison [facility] authorization has been received.   [employer], LCSW [phone]

## 2017-12-22 NOTE — Progress Notes (Signed)
[facility] Hematology/Oncology Progress Note  Date of admission: 12/14/2017  Hospital day:  12/22/2017  Chief Complaint: Stephen Swaney. is a 72 y.o. male who was admitted through the emergency room with generalized weakness, leukocytosis, and concern for sepsis.  Subjective:  Patient notes back pain.  Social History: The patient is alone today.  Allergies: No Known Allergies  Scheduled Medications: . diphenhydrAMINE  12.5 mg Intravenous Once  . feeding supplement (NEPRO CARB STEADY)  237 mL Oral TID BM  . insulin aspart  0-5 Units Subcutaneous QHS  . insulin aspart  0-9 Units Subcutaneous TID WC  . mouth rinse  15 mL Mouth Rinse BID  . multivitamin with minerals  1 tablet Oral Daily  . naphazoline-glycerin  1-2 drop Both Eyes BID  . senna  1 tablet Oral QHS  . vitamin C  250 mg Oral BID    Review of Systems: GENERAL: Patient denies any fevers or sweats at home.  He notes a 40-50 pound weight loss over 8 years. PERFORMANCE STATUS (ECOG):  2-3 HEENT:  No visual changes, runny nose, sore throat, mouth sores or tenderness. Lungs: No shortness of breath or cough.  No hemoptysis. Cardiac:  No chest pain, palpitations, orthopnea, or PND. GI:  Not eating well, partially secondary to no teeth (per patient).  No nausea, vomiting, diarrhea, constipation, melena or hematochezia. GU:  No urgency, frequency, dysuria, or hematuria. Musculoskeletal:  Back pain.  No muscle tenderness. Lymph nodes:  Patient aware of lumps under his right arm for months.  He is aware of recent biopsy. Extremities:  No pain or swelling. Skin:  Psoriasis.  Skin breakdown. Neuro:  Weak.  No headache, numbness or weakness, balance or coordination issues. Endocrine:  Diabetes.  No thyroid issues, hot flashes or night sweats. Psych:  No mood changes, depression or anxiety. Pain:  Back pain, improved. Review of systems:  All other systems reviewed and found to be negative.  Physical  Exam: Blood pressure 123/72, pulse 72, temperature (!) 101.3 F (38.5 C), temperature source Axillary, resp. rate 17, height 5' 9"  (1.753 m), weight 90 lb (40.8 kg), SpO2 100 %.  GENERAL:  Thin gentleman sitting lying comfortably on the medical unit in no acute distress. MENTAL STATUS:  Alert and oriented to person and place. HEAD:  Male pattern baldness.  Normocephalic, atraumatic, face symmetric, no Cushingoid features. EYES:  Brown eyes.  Pupils equal round and reactive to light and accomodation.  No conjunctivitis or scleral icterus. ENT:  Oropharynx clear without lesion.  Tongue normal.  Edentulous.  Mucous membranes moist.  RESPIRATORY:  Clear to auscultation without rales, wheezes or rhonchi. CARDIOVASCULAR:  Regular rate and rhythm without murmur, rub or gallop. ABDOMEN:  Soft, non-tender, with active bowel sounds, and no hepatosplenomegaly.  No masses. SKIN:  Dressing on elbows and left buttock. EXTREMITIES: Thin.  No edema, no skin discoloration or tenderness.  No palpable cords. LYMPH NODES: Palpable conglomerate right axillary adenopathy  NEUROLOGICAL: Unremarkable. PSYCH:  Appropriate.  Engaging. Conversant.   Results for orders placed or performed during the hospital encounter of 12/14/17 (from the past 48 hour(s))  Glucose, capillary     Status: Abnormal   Collection Time: 12/20/17  9:13 PM  Result Value Ref Range   Glucose-Capillary 222 (H) 70 - 99 mg/dL   Comment 1 Notify RN   Basic metabolic panel     Status: Abnormal   Collection Time: 12/21/17  5:13 AM  Result Value Ref Range   Sodium 137  135 - 145 mmol/L   Potassium 3.7 3.5 - 5.1 mmol/L   Chloride 106 98 - 111 mmol/L   CO2 26 22 - 32 mmol/L   Glucose, Bld 167 (H) 70 - 99 mg/dL   BUN 25 (H) 8 - 23 mg/dL   Creatinine, Ser 0.98 0.61 - 1.24 mg/dL   Calcium 8.2 (L) 8.9 - 10.3 mg/dL   GFR calc non Af Amer >60 >60 mL/min   GFR calc Af Amer >60 >60 mL/min    Comment: (NOTE) The eGFR has been calculated using the CKD  EPI equation. This calculation has not been validated in all clinical situations. eGFR's persistently <60 mL/min signify possible Chronic Kidney Disease.    Anion gap 5 5 - 15    Comment: Performed at [facility], [address] [place], [place] [address]  CBC     Status: Abnormal   Collection Time: 12/21/17  5:13 AM  Result Value Ref Range   WBC 19.1 (H) 3.8 - 10.6 K/uL   RBC 3.07 (L) 4.40 - 5.90 MIL/uL   Hemoglobin 8.0 (L) 13.0 - 18.0 g/dL   HCT 24.9 (L) 40.0 - 52.0 %   MCV 81.2 80.0 - 100.0 fL   MCH 26.0 26.0 - 34.0 pg   MCHC 32.0 32.0 - 36.0 g/dL   RDW 16.2 (H) 11.5 - 14.5 %   Platelets 358 150 - 440 K/uL    Comment: Performed at [facility], [address] [place], [place] [address]  Glucose, capillary     Status: Abnormal   Collection Time: 12/21/17  8:32 AM  Result Value Ref Range   Glucose-Capillary 123 (H) 70 - 99 mg/dL  Culture, blood (Routine X 2) w Reflex to ID Panel     Status: None (Preliminary result)   Collection Time: 12/21/17  8:39 AM  Result Value Ref Range   Specimen Description BLOOD RIGHT AC    Special Requests      BOTTLES DRAWN AEROBIC AND ANAEROBIC Blood Culture results may not be optimal due to an excessive volume of blood received in culture bottles   Culture      NO GROWTH < 24 HOURS Performed at [facility], [address], [place], [place] [address]    Report Status PENDING   Culture, blood (Routine X 2) w Reflex to ID Panel     Status: None (Preliminary result)   Collection Time: 12/21/17  8:39 AM  Result Value Ref Range   Specimen Description BLOOD RIGHT WRIST    Special Requests      BOTTLES DRAWN AEROBIC AND ANAEROBIC Blood Culture results may not be optimal due to an excessive volume of blood received in culture bottles   Culture      NO GROWTH < 24 HOURS Performed at [facility], [address] [place], [place] [address]    Report Status PENDING   Urinalysis, Routine w reflex microscopic      Status: Abnormal   Collection Time: 12/21/17  8:39 AM  Result Value Ref Range   Color, Urine YELLOW (A) YELLOW   APPearance CLEAR (A) CLEAR   Specific Gravity, Urine 1.024 1.005 - 1.030   pH 5.0 5.0 - 8.0   Glucose, UA NEGATIVE NEGATIVE mg/dL   Hgb urine dipstick NEGATIVE NEGATIVE   Bilirubin Urine NEGATIVE NEGATIVE   Ketones, ur NEGATIVE NEGATIVE mg/dL   Protein, ur 30 (A) NEGATIVE mg/dL   Nitrite NEGATIVE NEGATIVE   Leukocytes, UA NEGATIVE NEGATIVE  RBC / HPF 0-5 0 - 5 RBC/hpf   WBC, UA 0-5 0 - 5 WBC/hpf   Bacteria, UA NONE SEEN NONE SEEN   Squamous Epithelial / LPF 0-5 0 - 5   Mucus PRESENT    Hyaline Casts, UA PRESENT    Granular Casts, UA PRESENT     Comment: Performed at [facility], [address] [place], [place] [address]  Glucose, capillary     Status: Abnormal   Collection Time: 12/21/17 12:15 PM  Result Value Ref Range   Glucose-Capillary 185 (H) 70 - 99 mg/dL  Glucose, capillary     Status: None   Collection Time: 12/21/17  4:50 PM  Result Value Ref Range   Glucose-Capillary 76 70 - 99 mg/dL  Glucose, capillary     Status: Abnormal   Collection Time: 12/21/17  9:08 PM  Result Value Ref Range   Glucose-Capillary 120 (H) 70 - 99 mg/dL   Comment 1 Notify RN   CBC     Status: Abnormal   Collection Time: 12/22/17  5:43 AM  Result Value Ref Range   WBC 21.3 (H) 3.8 - 10.6 K/uL   RBC 3.30 (L) 4.40 - 5.90 MIL/uL   Hemoglobin 8.4 (L) 13.0 - 18.0 g/dL   HCT 26.9 (L) 40.0 - 52.0 %   MCV 81.5 80.0 - 100.0 fL   MCH 25.6 (L) 26.0 - 34.0 pg   MCHC 31.4 (L) 32.0 - 36.0 g/dL   RDW 16.0 (H) 11.5 - 14.5 %   Platelets 415 150 - 440 K/uL    Comment: Performed at [facility], [address] [place], [place] [address]  Basic metabolic panel     Status: Abnormal   Collection Time: 12/22/17  5:43 AM  Result Value Ref Range   Sodium 140 135 - 145 mmol/L   Potassium 3.9 3.5 - 5.1 mmol/L   Chloride 105 98 - 111 mmol/L   CO2 26 22 - 32 mmol/L    Glucose, Bld 110 (H) 70 - 99 mg/dL   BUN 20 8 - 23 mg/dL   Creatinine, Ser 0.96 0.61 - 1.24 mg/dL   Calcium 8.5 (L) 8.9 - 10.3 mg/dL   GFR calc non Af Amer >60 >60 mL/min   GFR calc Af Amer >60 >60 mL/min    Comment: (NOTE) The eGFR has been calculated using the CKD EPI equation. This calculation has not been validated in all clinical situations. eGFR's persistently <60 mL/min signify possible Chronic Kidney Disease.    Anion gap 9 5 - 15    Comment: Performed at [facility], [address] [place], [place] [address]  Glucose, capillary     Status: None   Collection Time: 12/22/17  8:07 AM  Result Value Ref Range   Glucose-Capillary 93 70 - 99 mg/dL  Glucose, capillary     Status: Abnormal   Collection Time: 12/22/17 11:58 AM  Result Value Ref Range   Glucose-Capillary 158 (H) 70 - 99 mg/dL  Glucose, capillary     Status: Abnormal   Collection Time: 12/22/17  2:43 PM  Result Value Ref Range   Glucose-Capillary 133 (H) 70 - 99 mg/dL  Glucose, capillary     Status: Abnormal   Collection Time: 12/22/17  6:29 PM  Result Value Ref Range   Glucose-Capillary 117 (H) 70 - 99 mg/dL  Hemoglobin and hematocrit, blood     Status: Abnormal   Collection Time: 12/22/17  7:11 PM  Result Value Ref Range  Hemoglobin 7.7 (L) 13.0 - 18.0 g/dL   HCT 23.7 (L) 40.0 - 52.0 %    Comment: Performed at [facility], [address] [place], [place] [address]   Dg Chest 1 View  Result Date: 12/22/2017 CLINICAL DATA:  Fever began today, history of diabetes EXAM: CHEST  1 VIEW COMPARISON:  Chest x-ray of 12/21/2016 FINDINGS: There is now haziness at the left lung base consistent with patchy pneumonia and probable small left pleural effusion. The right lung is clear. Mediastinal and hilar contours are unremarkable and the heart is within upper limits. No acute bony abnormality is seen. IMPRESSION: Parenchymal opacity at the left lung base consistent with patchy left lower lobe  pneumonia and small left pleural effusion. Electronically Signed   By: Ivar Drape M.D.   On: 12/22/2017 16:00   Dg Chest 1 View  Result Date: 12/21/2017 CLINICAL DATA:  Fever. EXAM: CHEST  1 VIEW COMPARISON:  Chest x-ray dated 12/14/2017 and chest CT dated 12/16/2017 FINDINGS: The heart size and mediastinal contours are within normal limits. Both lungs are clear. No effusions. The multiple skeletal metastases seen on the prior exam are not apparent on this exam other than a slight pathologic compression fracture of the left side of the superior endplate of T6. IMPRESSION: 1. No acute abnormalities of the lungs. 2. Pathologic fracture of the T6 vertebral body. Electronically Signed   By: Lorriane Shire M.D.   On: 12/21/2017 09:10    Assessment:  Stephen Bynum. is a 72 y.o. 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 He presented with failure to thrive.  He has lost a significant amount of weight.  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 There was left periaortic lymph node enlargement.  There were numerous additional lesions in the lumbar spine. Findings were consistent with metastatic disease. Mass lesion extended into the left psoas muscle. There was central nonenhancing necrosis in the left psoas muscle most likely due to tumor. Psoas abscess could have a similar appearance.  Chest, abdomen, and pelvic CT on 12/16/2017 revealed ill-defined areas of sclerosis throughout the visualized spine and in the sternum, pathologic compression fracture of L4, adenopathy, most evident in the right axilla, and ill-defined liver lesions. There are no lung masses.    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  PSA was 3.65 on 12/14/2017.  LDH was 214 (slightly elevated).  Anemia workup  included the following normal labs: ferritin (122), folate, and B12.  Iron saturation was 9% with a TIBC of 187. Retic was 1.7% (low).  Symptomatically, his back pain has improved.  Exam is stable.  Plan:   1.  Oncology:  Discuss results of pending lymph node biopsy. Preliminary pathology suggests a T-cell process.  Pathology should be back tomorrow.  Review of information available from [facility] on 07/26/2016 revealed the following:  Right lateral buttock biopsy in 09/2010 ([facility]) NOTE: Sections reveal an vague lichenoid minimally epidermotropic infiltrate of small to medium sized lymphocytes displaying low grade atypia with admixed eosinophils. Spongiotic psoriasiform hyperplasia with non-alternating parakeratosis is present. No evidence of psoriasis is seen. CD3 and CD5 highlight around 95% of the infiltrate. CD4 stains around 90% of the CD3 positive infiltrate whereas CD8 highlights around 10% of the CD3 positive infiltrate. CD7 expression is not decreased (around 60% expression). On balance, although concerning, the  findings are not definitive for the diagnosis of mycosis fungoides. Eczematous dermatitis and psoriasiform drug reaction are in the histologic differential diagnosis. Clinical-pathologic correlation with T-cell gene rearrangement studies is necessary. Skin last tested for T cell clonality in 10/2010 and 09/2010 were both negative   Peripheral smear from 10/07/2010 revealed 5% Sezary-like cells present.  [facility] admission 06/20/2016: Patient was brought to [facility] by caregivers who were concerned for the patient's skin condition that had progressed to the point he was unable to walk due to foot pain. He admitted to heavy alcohol use with withdrawal symptoms when he stopped drinking.  He was admitted with worsening of skin condition, particularly on hands and feet.  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 HIV, Hep B, Hep C, negative. Started wet  wraps 2-3 times daily, see below for instructions with significant improvement in skin exfoliation, erythema, and ulceration.  Zinc was low (0.22) and likely related to alcohol abuse.  He  was started on multivitamin and zinc   Lequita Asal, MD  12/22/2017, 8:29 PM

## 2017-12-22 NOTE — Progress Notes (Signed)
Nurse in room checking on pt, pt made motion like he was going to vomit, nurse assisted pt, pt vomitted brown emesis. MD Pyreddy notifed, MD coming to room to assess pt.

## 2017-12-22 NOTE — Progress Notes (Addendum)
MD Pyreddy at bedside. MD notified pt has temp of 101.3, PRN Tylenol suppository, and Zofran given. Orders received for chest xray 1 view, abdomen 1 view, D5%-%0.9 NS @ 75 ml, tele monitor, and orders to transfer pt to 1C. Orders placed.

## 2017-12-23 ENCOUNTER — Inpatient Hospital Stay: Payer: Medicare Other

## 2017-12-23 LAB — BASIC METABOLIC PANEL
ANION GAP: 8 (ref 5–15)
BUN: 15 mg/dL (ref 8–23)
CO2: 26 mmol/L (ref 22–32)
Calcium: 8.1 mg/dL — ABNORMAL LOW (ref 8.9–10.3)
Chloride: 103 mmol/L (ref 98–111)
Creatinine, Ser: 0.83 mg/dL (ref 0.61–1.24)
Glucose, Bld: 127 mg/dL — ABNORMAL HIGH (ref 70–99)
Potassium: 3.8 mmol/L (ref 3.5–5.1)
SODIUM: 137 mmol/L (ref 135–145)

## 2017-12-23 LAB — CBC
HCT: 25.9 % — ABNORMAL LOW (ref 40.0–52.0)
Hemoglobin: 8.3 g/dL — ABNORMAL LOW (ref 13.0–18.0)
MCH: 25.7 pg — ABNORMAL LOW (ref 26.0–34.0)
MCHC: 31.9 g/dL — ABNORMAL LOW (ref 32.0–36.0)
MCV: 80.7 fL (ref 80.0–100.0)
Platelets: 405 10*3/uL (ref 150–440)
RBC: 3.21 MIL/uL — AB (ref 4.40–5.90)
RDW: 16.5 % — ABNORMAL HIGH (ref 11.5–14.5)
WBC: 22.6 10*3/uL — AB (ref 3.8–10.6)

## 2017-12-23 LAB — GLUCOSE, CAPILLARY
GLUCOSE-CAPILLARY: 125 mg/dL — AB (ref 70–99)
GLUCOSE-CAPILLARY: 173 mg/dL — AB (ref 70–99)
GLUCOSE-CAPILLARY: 90 mg/dL (ref 70–99)
Glucose-Capillary: 97 mg/dL (ref 70–99)

## 2017-12-23 NOTE — Progress Notes (Signed)
Pharmacy Antibiotic Note  Stephen [name]. is a 72 y.o. male admitted on 12/14/2017 with sepsis.  Pharmacy has been consulted for cefepime and vancomycin dosing.  Plan: Continue Vancomycin 750 IV every 24 hours.  Goal trough 15-20 mcg/mL. Trough to be drawn prior to the fourth dose.  Continue Cefepime 2 gm  IV every 12 hours  Height: 5\' 9"  (175.3 cm) Weight: 90 lb (40.8 kg) IBW/kg (Calculated) : 70.7  Temp (24hrs), Avg:99.5 F (37.5 C), Min:98.4 F (36.9 C), Max:100.5 F (38.1 C)  Recent Labs  Lab 12/17/17 0143 12/19/17 0218 12/20/17 0501 12/21/17 0513 12/22/17 0543 12/23/17 0525  WBC 19.7* 21.0* 20.7* 19.1* 21.3* 22.6*  CREATININE 0.86 1.03 0.95 0.98 0.96 0.83  VANCOTROUGH 12* 19  --   --   --   --     Estimated Creatinine Clearance: 45.7 mL/min (by C-G formula based on SCr of 0.83 mg/dL).    No Known Allergies  Antimicrobials this admission: Vancomycin 09/12 >>  Cefepime 09/12 >>    Microbiology results: 09/11 BCx: pending with NGTD <24 hours 9/5 MRSA PCR (-)   Thank you for allowing pharmacy to be a part of this patient's care.  Pernell Dupre, PharmD, BCPS Clinical Pharmacist 12/23/2017 3:01 PM

## 2017-12-23 NOTE — Progress Notes (Signed)
ID Spoke with Dr.Pyreddy regarding his fever and leucocytosis- He has been restarted on vanco and cefepime byt the primary team. -Recommended repeat MRI of the Lumbar spine with contrast as suggested by radiologist to look for development of left psoas muscle fluid /abscess. If so it will need aspiration.procal may help.

## 2017-12-23 NOTE — Progress Notes (Signed)
[facility] [place] NAME: Stephen [name]    MR#:  [id number]  DATE OF BIRTH:  12/03/1945  SUBJECTIVE:   patient seen today No new episodes of vomiting Has fever Has generalized weakness Poor appetite REVIEW OF SYSTEMS:    Review of Systems  ConstituHas fever, has weight loss HENT: Negative for ear pain, nosebleeds, congestion, facial swelling, rhinorrhea, neck pain, neck stiffness and ear discharge.   Respiratory: Negative for cough, shortness of breath, wheezing  Cardiovascular: Negative for chest pain, palpitations and leg swelling.  Gastrointestinal: Negative for heartburn, abdominal pain,  diarrhea or consitpation Has vomiting Genitourinary: Negative for dysuria, urgency, frequency, hematuria Musculoskeletal: Has lower back pain Neurological: Negative for dizziness, seizures, syncope, focal weakness,  numbness and headaches.  Hematological: Does not bruise/bleed easily.  Psychiatric/Behavioral: Negative for hallucinations, confusion, dysphoric mood  Tolerating Diet: yes   DRUG ALLERGIES:  No Known Allergies  VITALS:  Blood pressure (!) 117/57, pulse 87, temperature (!) 100.5 F (38.1 C), temperature source Oral, resp. rate 17, height 5\' 9"  (1.753 m), weight 40.8 kg, SpO2 95 %.  PHYSICAL EXAMINATION:  Constitutional: Appears thin and frail no distress. HENT: Normocephalic. Marland Kitchen Oropharynx is clear and moist.  Eyes: Conjunctivae and EOM are normal. PERRLA, no scleral icterus.  Neck: Normal ROM. Neck supple. No JVD. No tracheal deviation. CVS: RRR, S1/S2 +, no murmurs, no gallops, no carotid bruit.  Pulmonary: Effort and breath sounds normal, no stridor, rhonchi, wheezes, Scattered rales in left lung  Abdominal: Soft. BS +,  no distension, tenderness, rebound or guarding.  Musculoskeletal: Normal range of motion. No edema  Lower back tenderness Neuro: Alert. CN 2-12 grossly intact. No focal deficits. Skin: Skin is warm and  dry. No rash noted. Psychiatric: Normal mood and affect.      LABORATORY PANEL:   CBC Recent Labs  Lab 12/23/17 0525  WBC 22.6*  HGB 8.3*  HCT 25.9*  PLT 405   ------------------------------------------------------------------------------------------------------------------  Chemistries  Recent Labs  Lab 12/23/17 0525  NA 137  K 3.8  CL 103  CO2 26  GLUCOSE 127*  BUN 15  CREATININE 0.83  CALCIUM 8.1*   ------------------------------------------------------------------------------------------------------------------  Cardiac Enzymes No results for input(s): TROPONINI in the last 168 hours. ------------------------------------------------------------------------------------------------------------------  RADIOLOGY:  Dg Chest 1 View  Result Date: 12/22/2017 CLINICAL DATA:  Fever began today, history of diabetes EXAM: CHEST  1 VIEW COMPARISON:  Chest x-ray of 12/21/2016 FINDINGS: There is now haziness at the left lung base consistent with patchy pneumonia and probable small left pleural effusion. The right lung is clear. Mediastinal and hilar contours are unremarkable and the heart is within upper limits. No acute bony abnormality is seen. IMPRESSION: Parenchymal opacity at the left lung base consistent with patchy left lower lobe pneumonia and small left pleural effusion. Electronically Signed   By: Ivar Drape M.D.   On: 12/22/2017 16:00     ASSESSMENT AND PLAN:   72 year old male with a history of depression and diabetes who is brought due to generalized weakness and back pain  1.  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 Rudene Christians MRI of the spine showed psoas muscle involvement with tumor versus abscess  2.  Sepsis with left psoas muscle edema with fevers Persistent leucocytosis in spite of being on broad-spectrum antibiotics Health care associated pneumonia IV  fluids Start broad spectrum antibiotics vancomycin and cefepime Discussed with  ID today We will repeat MRI of the lower spine to assess for psoriasis muscle abscess Cultures no growth so far If repeat MRI of the lower spine shows no swelling sepsis can consider discontinuation of antibiotics  3. Nausea and vomiting improved Dysphagia level 1 diet restarted  4.  Metastatic disease of unknown primary: Patient had successful ultrasound-guided biopsy of right axillary lymph node by IR  Follow-up on pathology  Oncology evaluation appreciated.    5.  Anemia of chronic disease: Continue to monitor hemoglobin  6.  Protein calorie malnutrition: Continue nutritional supplements  7.  Long-term prognosis poor Palliative care team follow-up  Patient is DNR and will have outpatient palliative care follow-up Palliative care consultation appreciated   Management plans discussed with the patient and he is in agreement.  CODE STATUS: DNR  TOTAL TIME TAKING CARE OF THIS PATIENT: 34 minutes.   PHYSICAL therapy recommending home health at discharge  POSSIBLE D/C 1 to 2 days DEPENDING ON CLINICAL CONDITION.   Saundra Shelling M.D on 12/23/2017 at 1:25 PM  Between 7am to 6pm - Pager - [phone] After 6pm go to [email or web address] - password [email or web address] [facility]  Office  [phone]  CC: Primary care physician; Glean Hess, MD  Note: This dictation was prepared with Dragon dictation along with smaller phrase technology. Any transcriptional errors that result from this process are unintentional.

## 2017-12-23 NOTE — Care Management Important Message (Signed)
Important Message  Patient Details  Name: Stephen [name]. MRN: [id number] Date of Birth: [birth year]   Medicare Important Message Given:  Yes    [name] 12/23/2017, 10:12 AM

## 2017-12-24 LAB — CBC
HEMATOCRIT: 25.9 % — AB (ref 40.0–52.0)
HEMOGLOBIN: 8 g/dL — AB (ref 13.0–18.0)
MCH: 26 pg (ref 26.0–34.0)
MCHC: 30.9 g/dL — AB (ref 32.0–36.0)
MCV: 84.3 fL (ref 80.0–100.0)
Platelets: 382 10*3/uL (ref 150–440)
RBC: 3.08 MIL/uL — ABNORMAL LOW (ref 4.40–5.90)
RDW: 16.8 % — ABNORMAL HIGH (ref 11.5–14.5)
WBC: 19.8 10*3/uL — ABNORMAL HIGH (ref 3.8–10.6)

## 2017-12-24 LAB — BASIC METABOLIC PANEL
Anion gap: 5 (ref 5–15)
BUN: 16 mg/dL (ref 8–23)
CHLORIDE: 106 mmol/L (ref 98–111)
CO2: 26 mmol/L (ref 22–32)
CREATININE: 0.83 mg/dL (ref 0.61–1.24)
Calcium: 8.3 mg/dL — ABNORMAL LOW (ref 8.9–10.3)
GFR calc Af Amer: >60 mL/min (ref >60)
GFR calc non Af Amer: >60 mL/min (ref >60)
Glucose, Bld: 138 mg/dL — ABNORMAL HIGH (ref 70–99)
Potassium: 3.7 mmol/L (ref 3.5–5.1)
Sodium: 137 mmol/L (ref 135–145)

## 2017-12-24 LAB — GLUCOSE, CAPILLARY
GLUCOSE-CAPILLARY: 111 mg/dL — AB (ref 70–99)
Glucose-Capillary: 110 mg/dL — ABNORMAL HIGH (ref 70–99)
Glucose-Capillary: 198 mg/dL — ABNORMAL HIGH (ref 70–99)
Glucose-Capillary: 186 mg/dL — ABNORMAL HIGH (ref 70–99)

## 2017-12-24 NOTE — Progress Notes (Signed)
[facility] [place] NAME: Stephen [name]    MR#:  [id number]  DATE OF BIRTH:  01-Apr-1946  SUBJECTIVE:  Patient had fever yesterday REVIEW OF SYSTEMS:    Review of Systems  Constitutional " +fever, has weight loss HENT: Negative for ear pain, nosebleeds, congestion, facial swelling, rhinorrhea, neck pain, neck stiffness and ear discharge.   Respiratory: Negative for cough, shortness of breath, wheezing  Cardiovascular: Negative for chest pain, palpitations and leg swelling.  Gastrointestinal: Negative for heartburn, abdominal pain,  diarrhea or consitpation Has vomiting Genitourinary: Negative for dysuria, urgency, frequency, hematuria Musculoskeletal: Has lower back pain Neurological: Negative for dizziness, seizures, syncope, focal weakness,  numbness and headaches.  Hematological: Does not bruise/bleed easily.  Psychiatric/Behavioral: Negative for hallucinations, confusion, dysphoric mood  Tolerating Diet: yes   DRUG ALLERGIES:  No Known Allergies  VITALS:  Blood pressure (!) 92/50, pulse 65, temperature 98.6 F (37 C), temperature source Oral, resp. rate 18, height 5\' 9"  (1.753 m), weight 40.8 kg, SpO2 100 %.  PHYSICAL EXAMINATION:  Constitutional: Appears thin and frail no distress. HENT: Normocephalic. Marland Kitchen Oropharynx is clear and moist.  Eyes: Conjunctivae and EOM are normal. PERRLA, no scleral icterus.  Neck: Normal ROM. Neck supple. No JVD. No tracheal deviation. CVS: RRR, S1/S2 +, no murmurs, no gallops, no carotid bruit.  Pulmonary: Effort and breath sounds normal, no stridor, rhonchi, wheezes, Scattered rales in left lung  Abdominal: Soft. BS +,  no distension, tenderness, rebound or guarding.  Musculoskeletal: Normal range of motion. No edema  Lower back tenderness Neuro: Alert. CN 2-12 grossly intact. No focal deficits. Skin: Skin is warm and dry. No rash noted. Psychiatric: Normal mood and affect.       LABORATORY PANEL:   CBC Recent Labs  Lab 12/24/17 0528  WBC 19.8*  HGB 8.0*  HCT 25.9*  PLT 382   ------------------------------------------------------------------------------------------------------------------  Chemistries  Recent Labs  Lab 12/24/17 0528  NA 137  K 3.7  CL 106  CO2 26  GLUCOSE 138*  BUN 16  CREATININE 0.83  CALCIUM 8.3*   ------------------------------------------------------------------------------------------------------------------  Cardiac Enzymes No results for input(s): TROPONINI in the last 168 hours. ------------------------------------------------------------------------------------------------------------------  RADIOLOGY:  Dg Chest 1 View  Result Date: 12/22/2017 CLINICAL DATA:  Fever began today, history of diabetes EXAM: CHEST  1 VIEW COMPARISON:  Chest x-ray of 12/21/2016 FINDINGS: There is now haziness at the left lung base consistent with patchy pneumonia and probable small left pleural effusion. The right lung is clear. Mediastinal and hilar contours are unremarkable and the heart is within upper limits. No acute bony abnormality is seen. IMPRESSION: Parenchymal opacity at the left lung base consistent with patchy left lower lobe pneumonia and small left pleural effusion. Electronically Signed   By: Ivar Drape M.D.   On: 12/22/2017 16:00   Mr Lumbar Spine Wo Contrast  Result Date: 12/23/2017 CLINICAL DATA:  Fever and leukocytosis. Study requested to assess for psoas muscle abscess. EXAM: MRI LUMBAR SPINE WITHOUT CONTRAST TECHNIQUE: Multiplanar, multisequence MR imaging of the lumbar spine was performed. No intravenous contrast was administered. COMPARISON:  CT abdomen 12/18/2017.  MRI 12/15/2017. FINDINGS: The examination is severely degraded by patient motion. The technologist was unable to achieve acceptable imaging because of motion. For that reason, contrast was not administered. One could consider CT scan in evaluating for  psoas abscess. As seen previously, abnormal marrow space signal is seen throughout the region consistent with metastatic disease. Pathologic compression fracture of L4  appears similar, with loss of height of 40-50%. Posterior bowing of the posterior margin of the vertebral body with extraosseous tumor results in compromise of the spinal canal. IMPRESSION: Markedly motion degraded. No change appreciated when compared to the study of 1 week ago. Abnormal marrow signal scattered throughout the region consistent with metastatic disease. Pathologic fracture at L4 with loss of height of 40-50% and extraosseous tumor encroaching upon and compromising the spinal canal at this level. Paraspinous muscles are poorly evaluated because of motion. Contrast was not administered. Consider CT which would be less susceptible to motion. Electronically Signed   By: Nelson Chimes M.D.   On: 12/23/2017 20:42     ASSESSMENT AND PLAN:   72 year old male with a history of depression and diabetes who is brought due to generalized weakness and back pain  1.  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 Rudene Christians MRI of the spine showed psoas muscle involvement with tumor versus abscess Repeat MRI on September 13 does not show evidence of psoas muscle abscess.   2.  Sepsis with left psoas muscle edema with fevers and now healthcare associated pneumonia Continue broad spectrum antibiotics vancomycin and cefepime due to pneumonia Sepsis was initial diagnosis however I feel that fevers are coming from tumor and not infection. We do need to treat for pneumonia however Repeat MRI of the lower spine does not show psoas muscle cyst.  3. Nausea and vomiting improved Dysphagia level 1 diet  4.  Metastatic disease of unknown primary: Patient had successful ultrasound-guided biopsy of right axillary lymph node by IR  It appears this may be  T-cell pathology as per oncology evaluation.  5.  Anemia of chronic disease: Continue to monitor hemoglobin No indication for blood transfusion 6.  Protein calorie malnutrition: Continue nutritional supplements   Patient is DNR and will have outpatient palliative care follow-up Palliative care consultation appreciated   Management plans discussed with the patient and he is in agreement.  CODE STATUS: DNR  TOTAL TIME TAKING CARE OF THIS PATIENT: 22  minutes.   PHYSICAL therapy recommending home health at discharge  POSSIBLE D/C 1 to 2 days DEPENDING ON CLINICAL CONDITION.   Schelly Chuba M.D on 12/24/2017 at 9:38 AM  Between 7am to 6pm - Pager - [phone] After 6pm go to [email or web address] - password [email or web address] [facility]  Office  [phone]  CC: Primary care physician; Glean Hess, MD  Note: This dictation was prepared with Dragon dictation along with smaller phrase technology. Any transcriptional errors that result from this process are unintentional.

## 2017-12-25 LAB — CBC
HEMATOCRIT: 26.7 % — AB (ref 40.0–52.0)
Hemoglobin: 8.6 g/dL — ABNORMAL LOW (ref 13.0–18.0)
MCH: 26.4 pg (ref 26.0–34.0)
MCHC: 32.2 g/dL (ref 32.0–36.0)
MCV: 81.9 fL (ref 80.0–100.0)
Platelets: 408 10*3/uL (ref 150–440)
RBC: 3.26 MIL/uL — AB (ref 4.40–5.90)
RDW: 16.9 % — AB (ref 11.5–14.5)
WBC: 24.3 10*3/uL — AB (ref 3.8–10.6)

## 2017-12-25 LAB — GLUCOSE, CAPILLARY
GLUCOSE-CAPILLARY: 212 mg/dL — AB (ref 70–99)
GLUCOSE-CAPILLARY: 144 mg/dL — AB (ref 70–99)
Glucose-Capillary: 141 mg/dL — ABNORMAL HIGH (ref 70–99)
Glucose-Capillary: 80 mg/dL (ref 70–99)

## 2017-12-25 LAB — BASIC METABOLIC PANEL
ANION GAP: 8 (ref 5–15)
BUN: 19 mg/dL (ref 8–23)
CHLORIDE: 108 mmol/L (ref 98–111)
CO2: 25 mmol/L (ref 22–32)
Calcium: 8.6 mg/dL — ABNORMAL LOW (ref 8.9–10.3)
Creatinine, Ser: 0.84 mg/dL (ref 0.61–1.24)
GFR calc Af Amer: >60 mL/min (ref >60)
Glucose, Bld: 158 mg/dL — ABNORMAL HIGH (ref 70–99)
POTASSIUM: 4.2 mmol/L (ref 3.5–5.1)
SODIUM: 141 mmol/L (ref 135–145)

## 2017-12-25 NOTE — Plan of Care (Signed)

## 2017-12-25 NOTE — Progress Notes (Signed)
[facility] [place] NAME: Stephen [name]    MR#:  [id number]  DATE OF BIRTH:  05/08/1945  SUBJECTIVE:  Patient still with fevers overnight  on broad-spectrum antibiotics No other acute events overnight   REVIEW OF SYSTEMS:    Review of Systems  Constitutional " +fever, has weight loss HENT: Negative for ear pain, nosebleeds, congestion, facial swelling, rhinorrhea, neck pain, neck stiffness and ear discharge.   Respiratory: Negative for cough, shortness of breath, wheezing  Cardiovascular: Negative for chest pain, palpitations and leg swelling.  Gastrointestinal: Negative for heartburn, abdominal pain,  diarrhea or consitpation Has vomiting Genitourinary: Negative for dysuria, urgency, frequency, hematuria Musculoskeletal: Has lower back pain Neurological: Negative for dizziness, seizures, syncope, focal weakness,  numbness and headaches.  Hematological: Does not bruise/bleed easily.  Psychiatric/Behavioral: Negative for hallucinations, confusion, dysphoric mood  Tolerating Diet: yes   DRUG ALLERGIES:  No Known Allergies  VITALS:  Blood pressure 115/65, pulse 93, temperature 99.6 F (37.6 C), temperature source Oral, resp. rate 18, height 5\' 9"  (1.753 m), weight 40.8 kg, SpO2 99 %.  PHYSICAL EXAMINATION:  Constitutional: Appears thin and frail no distress. HENT: Normocephalic. Marland Kitchen Oropharynx is clear and moist.  Eyes: Conjunctivae and EOM are normal. PERRLA, no scleral icterus.  Neck: Normal ROM. Neck supple. No JVD. No tracheal deviation. CVS: RRR, S1/S2 +, no murmurs, no gallops, no carotid bruit.  Pulmonary: Effort and breath sounds normal, no stridor, rhonchi, wheezes, Scattered rales in left lung  Abdominal: Soft. BS +,  no distension, tenderness, rebound or guarding.  Musculoskeletal: Normal range of motion. No edema  Lower back tenderness Neuro: Alert. CN 2-12 grossly intact. No focal deficits. Skin: Skin is warm and  dry. No rash noted. Psychiatric: Normal mood and affect.      LABORATORY PANEL:   CBC Recent Labs  Lab 12/25/17 0540  WBC 24.3*  HGB 8.6*  HCT 26.7*  PLT 408   ------------------------------------------------------------------------------------------------------------------  Chemistries  Recent Labs  Lab 12/25/17 0540  NA 141  K 4.2  CL 108  CO2 25  GLUCOSE 158*  BUN 19  CREATININE 0.84  CALCIUM 8.6*   ------------------------------------------------------------------------------------------------------------------  Cardiac Enzymes No results for input(s): TROPONINI in the last 168 hours. ------------------------------------------------------------------------------------------------------------------  RADIOLOGY:  Mr Lumbar Spine Wo Contrast  Result Date: 12/23/2017 CLINICAL DATA:  Fever and leukocytosis. Study requested to assess for psoas muscle abscess. EXAM: MRI LUMBAR SPINE WITHOUT CONTRAST TECHNIQUE: Multiplanar, multisequence MR imaging of the lumbar spine was performed. No intravenous contrast was administered. COMPARISON:  CT abdomen 12/18/2017.  MRI 12/15/2017. FINDINGS: The examination is severely degraded by patient motion. The technologist was unable to achieve acceptable imaging because of motion. For that reason, contrast was not administered. One could consider CT scan in evaluating for psoas abscess. As seen previously, abnormal marrow space signal is seen throughout the region consistent with metastatic disease. Pathologic compression fracture of L4 appears similar, with loss of height of 40-50%. Posterior bowing of the posterior margin of the vertebral body with extraosseous tumor results in compromise of the spinal canal. IMPRESSION: Markedly motion degraded. No change appreciated when compared to the study of 1 week ago. Abnormal marrow signal scattered throughout the region consistent with metastatic disease. Pathologic fracture at L4 with loss of height  of 40-50% and extraosseous tumor encroaching upon and compromising the spinal canal at this level. Paraspinous muscles are poorly evaluated because of motion. Contrast was not administered. Consider CT which would be less susceptible  to motion. Electronically Signed   By: Nelson Chimes M.D.   On: 12/23/2017 20:42     ASSESSMENT AND PLAN:   72 year old male with a history of depression and diabetes who is brought due to generalized weakness and back pain  1.  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 Rudene Christians MRI of the spine showed psoas muscle involvement with tumor versus abscess Repeat MRI on September 13 does not show evidence of psoas muscle abscess.   2.  Sepsis with left psoas muscle edema with fevers and now healthcare associated pneumonia Sepsis was initial diagnosis however I feel that fevers are coming from tumor and not infection. Patient has been adequately treated for pneumonia. He had MSSA from wound culture which is also adequately treated. I will discontinue all in a biotics at this point. ID to see patient tomorrow  3. Nausea and vomiting improved Dysphagia level 1 diet  4.  Metastatic disease of unknown primary: Patient had successful ultrasound-guided biopsy of right axillary lymph node by IR  It appears this may be T-cell pathology as per oncology evaluation. Still awaiting for final results Follow-up with oncology tomorrow   5.  Anemia of chronic disease: Continue to monitor hemoglobin No indication for blood transfusion 6.  Severe protein calorie malnutrition: Continue nutritional supplements   Patient is DNR and will have outpatient palliative care follow-up Palliative care consultation appreciated   Management plans discussed with the patient and he is in agreement.  CODE STATUS: DNR  TOTAL TIME TAKING CARE OF THIS PATIENT: 22  minutes.   PHYSICAL therapy recommending home health at  discharge  POSSIBLE D/C 1 to 2 days DEPENDING ON CLINICAL CONDITION.   [name] M.D on 12/25/2017 at 10:59 AM  Between 7am to 6pm - Pager - [phone] After 6pm go to [email or web address] - password [email or web address] [facility]  Office  [phone]  CC: Primary care physician; Glean Hess, MD  Note: This dictation was prepared with Dragon dictation along with smaller phrase technology. Any transcriptional errors that result from this process are unintentional.

## 2017-12-25 NOTE — Progress Notes (Signed)
Pnt alert to voice, disoriented to all but self. IV antibiotics administered. Difficulty arousing pnt enough to give medications periodically overnight. Pnt did have a temp this am;which was reported to Dr. Jodell Cipro. Gave tylenol and reported to day nurse febrile this am.

## 2017-12-26 DIAGNOSIS — R6510 Systemic inflammatory response syndrome (SIRS) of non-infectious origin without acute organ dysfunction: Secondary | ICD-10-CM

## 2017-12-26 DIAGNOSIS — Z7189 Other specified counseling: Secondary | ICD-10-CM

## 2017-12-26 DIAGNOSIS — C799 Secondary malignant neoplasm of unspecified site: Secondary | ICD-10-CM

## 2017-12-26 DIAGNOSIS — K6812 Psoas muscle abscess: Secondary | ICD-10-CM

## 2017-12-26 DIAGNOSIS — Z66 Do not resuscitate: Secondary | ICD-10-CM

## 2017-12-26 DIAGNOSIS — Z515 Encounter for palliative care: Secondary | ICD-10-CM

## 2017-12-26 DIAGNOSIS — K567 Ileus, unspecified: Secondary | ICD-10-CM

## 2017-12-26 DIAGNOSIS — M8448XA Pathological fracture, other site, initial encounter for fracture: Secondary | ICD-10-CM

## 2017-12-26 DIAGNOSIS — R222 Localized swelling, mass and lump, trunk: Secondary | ICD-10-CM

## 2017-12-26 LAB — CBC
HCT: 24.5 % — ABNORMAL LOW (ref 40.0–52.0)
Hemoglobin: 8.1 g/dL — ABNORMAL LOW (ref 13.0–18.0)
MCH: 26.8 pg (ref 26.0–34.0)
MCHC: 33 g/dL (ref 32.0–36.0)
MCV: 81.2 fL (ref 80.0–100.0)
PLATELETS: 418 10*3/uL (ref 150–440)
RBC: 3.02 MIL/uL — ABNORMAL LOW (ref 4.40–5.90)
RDW: 16.5 % — AB (ref 11.5–14.5)
WBC: 25.6 10*3/uL — ABNORMAL HIGH (ref 3.8–10.6)

## 2017-12-26 LAB — GLUCOSE, CAPILLARY
Glucose-Capillary: 125 mg/dL — ABNORMAL HIGH (ref 70–99)
Glucose-Capillary: 90 mg/dL (ref 70–99)
Glucose-Capillary: 101 mg/dL — ABNORMAL HIGH (ref 70–99)

## 2017-12-26 LAB — CULTURE, BLOOD (ROUTINE X 2)
CULTURE: NO GROWTH
CULTURE: NO GROWTH

## 2017-12-26 LAB — SURGICAL PATHOLOGY

## 2017-12-26 MED ORDER — DOCUSATE SODIUM 100 MG PO CAPS
100.0000 mg | ORAL_CAPSULE | Freq: Two times a day (BID) | ORAL | Status: DC
  Administered 2017-12-27: 10:00:00 100 mg via ORAL
  Filled 2017-12-26 (×3): qty 1

## 2017-12-26 NOTE — Care Management Important Message (Signed)
Copy of signed IM left with patient in room.  

## 2017-12-26 NOTE — Progress Notes (Signed)
Pt had a fever of 103.2. Tylenol suppository was given and will continue to monitor.

## 2017-12-26 NOTE — Progress Notes (Signed)
Physical Therapy Treatment Patient Details Name: Stephen [name]. MRN: [id number] DOB: 29-Dec-1945 Today's Date: 12/26/2017    History of Present Illness Patient is a 72 year old male admitted for back pain and sepsis.  PMH includes major depressive disorder, DM and psoriasis.    PT Comments    Pt is very resistant to work with therapy reporting he is cold and just wants to stay in bed. Vitals assessed, BP at 148/92 and HR at 90bpm. Unable to obtain temp. All mobility performed on RA with no fatigue or SOB symptoms noted. PT reluctantly got out of bed and ambulated to recliner, however refuses further PT this date. Very agitated and wants his cell phone beside him, even after therapist placed cell phone beside him. Very rude to staff, however then slightly apologetic. Currently is not at baseline nor safe to return home. Agree with current SNF recommendation. Will continue to progress as able. RN aide notified that pt in recliner.    Follow Up Recommendations  SNF     Equipment Recommendations  None recommended by PT    Recommendations for Other Services       Precautions / Restrictions Precautions Precautions: Fall Restrictions Weight Bearing Restrictions: No    Mobility  Bed Mobility Overal bed mobility: Needs Assistance Bed Mobility: Supine to Sit     Supine to sit: Mod assist     General bed mobility comments: needs assist for sliding out towards EOB. Once seated at EOB, heavy forward trunk flexed posture noted. Reports minimal pain with movement  Transfers Overall transfer level: Needs assistance Equipment used: Rolling walker (2 wheeled) Transfers: Sit to/from Stand Sit to Stand: Min assist         General transfer comment: attempted transfer with supervision, however unable to fully stand upright. On second attempt, needs min assist to stand and use RW correctly with verbal cues.  Ambulation/Gait Ambulation/Gait assistance: Min assist Gait Distance (Feet):  3 Feet Assistive device: Rolling walker (2 wheeled) Gait Pattern/deviations: Step-to pattern     General Gait Details: ambulated towards recliner with cues for using RW correctly. Pt self limiting. Did not appear fatigued with minimal ambulation.   Stairs             Wheelchair Mobility    Modified Rankin (Stroke Patients Only)       Balance                                            Cognition Arousal/Alertness: Awake/alert Behavior During Therapy: Agitated Overall Cognitive Status: Within Functional Limits for tasks assessed                                        Exercises Other Exercises Other Exercises: Wants to be covered up with blankets, refuses further therapy    General Comments        Pertinent Vitals/Pain Pain Assessment: Faces Faces Pain Scale: Hurts a little bit Pain Location: LB Pain Descriptors / Indicators: Aching;Grimacing;Constant Pain Intervention(s): Limited activity within patient's tolerance;Premedicated before session;Repositioned    Home Living                      Prior Function            PT Goals (current goals  can now be found in the care plan section) Acute Rehab PT Goals Patient Stated Goal: To return to regular daily activity and to wean from use of RW. PT Goal Formulation: With patient Time For Goal Achievement: 01/09/18 Potential to Achieve Goals: Good Progress towards PT goals: Progressing toward goals    Frequency    Min 2X/week      PT Plan Current plan remains appropriate    Co-evaluation              AM-PAC PT "6 Clicks" Daily Activity  Outcome Measure  Difficulty turning over in bed (including adjusting bedclothes, sheets and blankets)?: Unable Difficulty moving from lying on back to sitting on the side of the bed? : Unable Difficulty sitting down on and standing up from a chair with arms (e.g., wheelchair, bedside commode, etc,.)?: Unable Help needed  moving to and from a bed to chair (including a wheelchair)?: A Little Help needed walking in hospital room?: A Little Help needed climbing 3-5 steps with a railing? : A Lot 6 Click Score: 11    End of Session Equipment Utilized During Treatment: Gait belt Activity Tolerance: Treatment limited secondary to agitation Patient left: in chair;with chair alarm set Nurse Communication: Mobility status PT Visit Diagnosis: Unsteadiness on feet (R26.81);Muscle weakness (generalized) (M62.81)     Time: 1540-0867 PT Time Calculation (min) (ACUTE ONLY): 17 min  Charges:  $Gait Training: 8-22 mins                     Greggory Stallion, PT, DPT [phone]    Stephen [name] 12/26/2017, 10:24 AM

## 2017-12-26 NOTE — Progress Notes (Signed)
Put Dr Mike Gip  on phone with Rosebud Poles patients daughter .

## 2017-12-26 NOTE — Plan of Care (Signed)

## 2017-12-26 NOTE — Progress Notes (Signed)
Daily Progress Note   Patient Name: Stephen [name].       Date: 12/26/2017 DOB: May 29, 1945  Age: 72 y.o. MRN#: [id number] Attending Physician: Bettey Costa, MD Primary Care Physician: Glean Hess, MD Admit Date: 12/14/2017  Reason for Consultation/Follow-up: Establishing goals of care, Non pain symptom management, Pain control and Psychosocial/spiritual support  Subjective: Patient is out of bed up in chair. His breakfast is on his bedside table.  When patient asked if he was not going to eat his breakfast.  He states "I am not hungry and I am not going to eat."  He complains of generalized pain.  Denies any complications with itching or skin discomfort.  RN is at the bedside preparing to administer PRN medication.  During conversation he has blanket over his head.  When asked if he could remove blanket until we finished our conversation he states that he is freezing cold.  Warm blanket given and applied around his shoulders.  Patient at that time remove the blanket from his head.  [name] usually asked to get him out of the hospital so that he can go home.  During conversation he is alert at times and then at times he is somewhat confused.  I asked him if his pain is being controlled and he states no he did not sleep any last night.  Bedside RN reported that patient slept majority of the night and received pain medicine at 3 AM, but prior to he was a sleep.  During conversation patient randomly begins yelling out mama, come back.  Patient made aware that his mother was not present in the room or at the bedside he just looked with a blank stare and no response.  He then proceeded to request to be sent home again.  I spoke with his close family friend at the bedside who stated that patient  informed him that he wanted to go home and die. Friend initially stated he was his nephew, however, later admitted that he was a close friend and only wanted the best for him. Advised friend to follow up with patient's daughter which he stated he did prior to visiting. He states he was hopeful that he could just get him home and allow him to pass away as the patient requested. Visitor made aware that he should  continue to discuss health status and decisions with patient's daughter. He verbalized understanding.   I spoke with patient's daughter, Stephen [name], she verbalized that she is aware that her father continues to get weaker.  She does feel that once visiting him this weekend his pain was more controlled however she also noticed that he seemed a little bit more confused.  She was concerned that patient also was referring to his mother during her visitation and she made it aware that his mother had passed away several years ago.  [name] states that she lives in [place] and was hopeful that her father could be placed closer to her however she knows that [facility]  She states that she is only waiting to hear back from oncology to find out what the pathology report is shown.  Once she has received updates from oncology and is aware of all of their options she states at that point she will be more at peace having answers.  She feels that ultimately her father may not be strong enough to undergo chemotherapy treatment, but is hopeful that he may be able to be placed at [facility] for rehabilitation and then can further assess for long-term needs with potentially hospice. She is in agreement to have outpatient palliative services at discharge.  Chart Reviewed and report received from bedside, RN.   Length of Stay: 12  Current Medications: Scheduled Meds:  . docusate sodium  100 mg Oral BID  . feeding supplement (NEPRO CARB STEADY)  237 mL Oral TID BM  . insulin aspart  0-5  Units Subcutaneous QHS  . insulin aspart  0-9 Units Subcutaneous TID WC  . mouth rinse  15 mL Mouth Rinse BID  . multivitamin with minerals  1 tablet Oral Daily  . naphazoline-glycerin  1-2 drop Both Eyes BID  . senna  1 tablet Oral QHS  . vitamin C  250 mg Oral BID    Continuous Infusions: . famotidine (PEPCID) IV 20 mg (12/26/17 1036)    PRN Meds: acetaminophen **OR** acetaminophen, baclofen, camphor-menthol, diphenhydrAMINE, ibuprofen, metoprolol tartrate, morphine injection, ondansetron **OR** ondansetron (ZOFRAN) IV, oxyCODONE-acetaminophen, triamcinolone ointment  Physical Exam  Constitutional: He appears cachectic. He appears ill.  Thin and frail in appearance  Cardiovascular: Normal rate, regular rhythm and normal heart sounds. Exam reveals decreased pulses.  Pulmonary/Chest: Effort normal. He has decreased breath sounds.  Neurological: He is alert.  Intermittent confusion  Skin:  Rash  Nursing note and vitals reviewed.           Vital Signs: BP 120/73 (BP Location: Right Arm)   Pulse (!) 106   Temp (!) 100.7 F (38.2 C) (Oral)   Resp 20   Ht 5\' 9"  (1.753 m)   Wt 40.8 kg   SpO2 (!) 89%   BMI 13.29 kg/m  SpO2: SpO2: (!) 89 % O2 Device: O2 Device: Room Air O2 Flow Rate: O2 Flow Rate (L/min): 2 L/min  Intake/output summary:   Intake/Output Summary (Last 24 hours) at 12/26/2017 1457 Last data filed at 12/26/2017 0545 Gross per 24 hour  Intake 1327.16 ml  Output 300 ml  Net 1027.16 ml   LBM: Last BM Date: 12/24/17 Baseline Weight: Weight: 43.1 kg Most recent weight: Weight: 40.8 kg      Palliative Assessment/Data:PPS 30 %   Patient Active Problem List   Diagnosis Date Noted  . Back pain   . Metastatic disease ([place])   . Paraspinous mass   .  Sepsis ([place])   . Palliative care encounter   . Pressure injury of skin 12/17/2017  . SIRS (systemic inflammatory response syndrome) ([place]) 12/14/2017  . Situational insomnia 07/18/2017  . Erythematous condition  07/18/2017  . Hypomagnesemia 07/08/2016  . 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 PVD (peripheral vascular disease) ([place]) 11/17/2015  . Hx of falling 11/17/2015  . Protein-calorie malnutrition, severe 11/09/2015  . Alcoholism in remission ([place]) 09/12/2015  . Duodenitis 05/06/2015  . Hyponatremia 12/15/2014  . Thrombocytopenia ([place]) 12/15/2014  . Malnutrition ([place]) 12/15/2014  . Tobacco abuse 12/15/2014  . Cholelithiasis 12/15/2014  . Diabetes mellitus type 2, uncontrolled ([place]) 09/18/2014  . Psoriasis 09/16/2014  . Excessive weight loss 09/16/2014    Palliative Care Assessment & Plan   Patient Profile: 72 y.o.malewith past medical history of polysubstance abuse, psoriasis, diabetes mellitus, depression, and malnutritionwho was admitted on 9/4/2019with sepsis and back pain. Work up revealed metastatic disease in the area of his lumbar spine and an L4 compression fracture. His LDH was elevated and there is a concern for malignancy.  Assessment: Patient is a DNR.  Continue to treat the treatable with full scope.  Patient continues to complain of generalized pain intermittently, however current pain regimen is controlling pain at this time.  Patient continues to have poor p.o. Intake and generalized weakness.   Recommendations/Plan: DNR/DNI-as confirmed  Continue fullscope treatment Continue with current pain regimen of as needed Percocet and morphine for breakthrough pain. Daughter is awaiting further discussion/follow-up from oncology regarding pathology report and further treatment options if any. Palliative medicine team will continue to support patient, patient's family, and medical team during hospitalization.  Goals of Care and Additional Recommendations: Limitations on Scope of Treatment: Full Scope Treatment  Code Status:    Code Status Orders  (From admission, onward)         Start     Ordered   12/19/17 1005  Do not attempt resuscitation (DNR)  Continuous    Question  Answer Comment  In the event of cardiac or respiratory ARREST Do not call a "code blue"   In the event of cardiac or respiratory ARREST Do not perform Intubation, CPR, defibrillation or ACLS   In the event of cardiac or respiratory ARREST Use medication by any route, position, wound care, and other measures to relive pain and suffering. May use oxygen, suction and manual treatment of airway obstruction as needed for comfort.      12/19/17 1004        Code Status History    Date Active Date Inactive Code Status Order ID Comments User Context   12/14/2017 1621 12/19/2017 1004 Full Code [id number]  Henreitta Leber, MD Inpatient   11/07/2015 1753 11/09/2015 1954 Full Code [id number]  Henreitta Leber, MD Inpatient   05/01/2015 1918 05/03/2015 1707 Full Code [id number]  Aldean Jewett, MD Inpatient   12/14/2014 1035 12/15/2014 1549 Full Code [id number]  Theodoro Grist, MD Inpatient      Prognosis:  Unable to determine-More than likely less than 6 months due to rapid recent decline.  No longer able to walk, eating very little, most likely wide spread metastatic cancer.   Uncertain of treatment options at this point.  Discharge Planning: To Be Determined, her daughter [name], patient most [facility] possible evaluation of long-term care.  Care plan was discussed with patient's daughter, attending, and bedside RN.   Thank you for allowing the Palliative Medicine Team to assist in the care of this  patient.   Total Time 45 min Prolonged Time Billed  NO       Greater than 50%  of this time was spent counseling and coordinating care related to the above assessment and plan.  Alda Lea, AGPCNP-BC Palliative Medicine Team  Pager: [phone] Amion: Bjorn Pippin   Please contact Palliative Medicine Team phone at [phone] for questions and concerns.

## 2017-12-26 NOTE — Progress Notes (Signed)
[facility] [place] NAME: Stephen [name]    MR#:  [id number]  DATE OF BIRTH:  Nov 04, 1945  SUBJECTIVE:  No fevers overnight   REVIEW OF SYSTEMS:    Review of Systems  Constitutional; no fever, has weight loss HENT: Negative for ear pain, nosebleeds, congestion, facial swelling, rhinorrhea, neck pain, neck stiffness and ear discharge.   Respiratory: Negative for cough, shortness of breath, wheezing  Cardiovascular: Negative for chest pain, palpitations and leg swelling.  Gastrointestinal: Negative for heartburn, abdominal pain,  diarrhea or consitpation Has vomiting Genitourinary: Negative for dysuria, urgency, frequency, hematuria Musculoskeletal: Has lower back pain improving Neurological: Negative for dizziness, seizures, syncope, focal weakness,  numbness and headaches.  Hematological: Does not bruise/bleed easily.  Psychiatric/Behavioral: Negative for hallucinations, confusion, dysphoric mood  Tolerating Diet: yes   DRUG ALLERGIES:  No Known Allergies  VITALS:  Blood pressure 115/60, pulse 89, temperature 99.2 F (37.3 C), temperature source Oral, resp. rate 16, height 5\' 9"  (1.753 m), weight 40.8 kg, SpO2 94 %.  PHYSICAL EXAMINATION:  Constitutional: Appears thin and frail no distress. HENT: Normocephalic. Marland Kitchen Oropharynx is clear and moist.  Eyes: Conjunctivae and EOM are normal. PERRLA, no scleral icterus.  Neck: Normal ROM. Neck supple. No JVD. No tracheal deviation. CVS: RRR, S1/S2 +, no murmurs, no gallops, no carotid bruit.  Pulmonary: Effort and breath sounds normal, no stridor, rhonchi, wheezes, Scattered rales in left lung  Abdominal: Soft. BS +,  no distension, tenderness, rebound or guarding.  Musculoskeletal: Normal range of motion. No edema  Lower back tenderness Neuro: Alert. CN 2-12 grossly intact. No focal deficits. Skin: Skin is warm and dry. No rash noted. Psychiatric: Normal mood and affect.       LABORATORY PANEL:   CBC Recent Labs  Lab 12/26/17 0524  WBC 25.6*  HGB 8.1*  HCT 24.5*  PLT 418   ------------------------------------------------------------------------------------------------------------------  Chemistries  Recent Labs  Lab 12/25/17 0540  NA 141  K 4.2  CL 108  CO2 25  GLUCOSE 158*  BUN 19  CREATININE 0.84  CALCIUM 8.6*   ------------------------------------------------------------------------------------------------------------------  Cardiac Enzymes No results for input(s): TROPONINI in the last 168 hours. ------------------------------------------------------------------------------------------------------------------  RADIOLOGY:  No results found.   ASSESSMENT AND PLAN:   72 year old male with a history of depression and diabetes who is brought due to generalized weakness and back pain  1.  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 Rudene Christians MRI of the spine showed psoas muscle involvement with tumor versus abscess Repeat MRI on September 13 does not show evidence of psoas muscle abscess.   2.  Sepsis with left psoas muscle edema with fevers and now healthcare associated pneumonia Sepsis was initial diagnosis however fevers may be coming from tumor and not infection. Patient has been adequately treated for pneumonia. He had MSSA from wound culture which is also adequately treated. Id to see patient  ?/ IR FLUOR biopsy today??   3. Nausea and vomiting improved Dysphagia level 1 diet  4.  Metastatic disease of unknown primary: Patient had successful ultrasound-guided biopsy of right axillary lymph node by IR  It appears this may be T-cell pathology as per oncology evaluation. Still awaiting for final results Follow-up with oncology    5.  Anemia of chronic disease: Continue to monitor hemoglobin No indication for blood transfusion 8.1 today  6.  Severe  protein calorie malnutrition: Continue nutritional supplements  Patient is DNR and will have outpatient palliative care follow-up Palliative care consultation appreciated   Management plans discussed with the patient and he is in agreement.  CODE STATUS: DNR  TOTAL TIME TAKING CARE OF THIS PATIENT: 22  minutes.   PHYSICAL therapy recommending SNF Family in agreeement  CSW consult placed POSSIBLE D/C 1 to 2 days DEPENDING ON CLINICAL CONDITION.   Jade Burkard M.D on 12/26/2017 at 10:41 AM  Between 7am to 6pm - Pager - [phone] After 6pm go to [email or web address] - password [email or web address] [facility]  Office  [phone]  CC: Primary care physician; Glean Hess, MD  Note: This dictation was prepared with Dragon dictation along with smaller phrase technology. Any transcriptional errors that result from this process are unintentional.

## 2017-12-26 NOTE — Progress Notes (Signed)
[facility] Hematology/Oncology Progress Note  Date of admission: 12/14/2017  Hospital day:  12/26/2017  Chief Complaint: Stephen [name]. is a 72 y.o. male who was admitted through the emergency room with generalized weakness, leukocytosis, and concern for sepsis.  Subjective:  Patient states that he wants to go home.  Back pain is better.  Social History: The patient is alone today. He asked that I call the 2 numbers on his board (daughter and "taxi cab driver").  I was unable to reach either on several attempts.  I was able to reach his daughter at the nurses' station.  She expressed concern that he is unable to care for himself.  Someone that was not his son was trying to get him out of the hospital ("a friend").  She expressed concern that he is not mentally competent to make decisions for himself.  He told her that he spoke with his mother, who is dead.  She is looking for old paperwork regarding medical power of attorney.  Allergies: No Known Allergies  Scheduled Medications: . docusate sodium  100 mg Oral BID  . feeding supplement (NEPRO CARB STEADY)  237 mL Oral TID BM  . insulin aspart  0-5 Units Subcutaneous QHS  . insulin aspart  0-9 Units Subcutaneous TID WC  . mouth rinse  15 mL Mouth Rinse BID  . multivitamin with minerals  1 tablet Oral Daily  . naphazoline-glycerin  1-2 drop Both Eyes BID  . senna  1 tablet Oral QHS  . vitamin C  250 mg Oral BID    Review of Systems: GENERAL:  Wants to go home.  No fevers, sweats .  Weight loss. PERFORMANCE STATUS (ECOG):  3 HEENT:  No visual changes, runny nose, sore throat, mouth sores or tenderness. Lungs: No shortness of breath or cough.  No hemoptysis. Cardiac:  No chest pain, palpitations, orthopnea, or PND. GI:  Not eating well, partially secondary to no teeth (per patient).  No nausea, vomiting, diarrhea, constipation, melena or hematochezia. GU:  No urgency, frequency, dysuria, or  hematuria. Musculoskeletal:  Back pain, improved.  No joint pain.  No muscle tenderness. Extremities:  No pain or swelling. Skin:  Psoriasis. Skin breakdown. Neuro:  General weakness.  No headache, numbness or weakness, balance or coordination issues. Endocrine:  No diabetes, thyroid issues, hot flashes or night sweats. Psych:  No mood changes, depression or anxiety. Pain:  Back pain, improved. Review of systems:  All other systems reviewed and found to be negative.   Physical Exam: Blood pressure 120/73, pulse (!) 106, temperature (!) 100.7 F (38.2 C), temperature source Oral, resp. rate 20, height 5' 9"  (1.753 m), weight 90 lb (40.8 kg), SpO2 (!) 89 %.  GENERAL:  Thin gentleman sitting lying curled up on the medical unit in no acute distress. MENTAL STATUS:  Alert and oriented to person, place and time. HEAD:  Male pattern baldness.  Normocephalic, atraumatic, face symmetric, no Cushingoid features. EYES:  Brown eyes.  Pupils equal round and reactive to light and accomodation.  No conjunctivitis or scleral icterus. ENT:  Oropharynx clear without lesion.  Tongue normal. Mucous membranes moist.  RESPIRATORY:  Clear to auscultation without rales, wheezes or rhonchi. CARDIOVASCULAR:  Regular rate and rhythm without murmur, rub or gallop. ABDOMEN:  Soft, non-tender, with active bowel sounds, and no hepatosplenomegaly.  No masses. SKIN:  Elbow dressing in place. EXTREMITIES: Thin.  No edema, no skin discoloration or tenderness.  No palpable cords. LYMPH NODES: Palpable conglomerate  right axillary adenopathy.  NEUROLOGICAL: Unremarkable. PSYCH:  Repeatedly asks me to call a taxi or bring him to the police department.    Results for orders placed or performed during the hospital encounter of 12/14/17 (from the past 48 hour(s))  Glucose, capillary     Status: Abnormal   Collection Time: 12/24/17  8:46 PM  Result Value Ref Range   Glucose-Capillary 111 (H) 70 - 99 mg/dL  CBC     Status:  Abnormal   Collection Time: 12/25/17  5:40 AM  Result Value Ref Range   WBC 24.3 (H) 3.8 - 10.6 K/uL   RBC 3.26 (L) 4.40 - 5.90 MIL/uL   Hemoglobin 8.6 (L) 13.0 - 18.0 g/dL   HCT 26.7 (L) 40.0 - 52.0 %   MCV 81.9 80.0 - 100.0 fL   MCH 26.4 26.0 - 34.0 pg   MCHC 32.2 32.0 - 36.0 g/dL   RDW 16.9 (H) 11.5 - 14.5 %   Platelets 408 150 - 440 K/uL    Comment: Performed at [facility], [address] [place], [place] [address]  Basic metabolic panel     Status: Abnormal   Collection Time: 12/25/17  5:40 AM  Result Value Ref Range   Sodium 141 135 - 145 mmol/L   Potassium 4.2 3.5 - 5.1 mmol/L   Chloride 108 98 - 111 mmol/L   CO2 25 22 - 32 mmol/L   Glucose, Bld 158 (H) 70 - 99 mg/dL   BUN 19 8 - 23 mg/dL   Creatinine, Ser 0.84 0.61 - 1.24 mg/dL   Calcium 8.6 (L) 8.9 - 10.3 mg/dL   GFR calc non Af Amer >60 >60 mL/min   GFR calc Af Amer >60 >60 mL/min    Comment: (NOTE) The eGFR has been calculated using the CKD EPI equation. This calculation has not been validated in all clinical situations. eGFR's persistently <60 mL/min signify possible Chronic Kidney Disease.    Anion gap 8 5 - 15    Comment: Performed at [facility], [address] [place], [place] [address]  Glucose, capillary     Status: Abnormal   Collection Time: 12/25/17  7:43 AM  Result Value Ref Range   Glucose-Capillary 141 (H) 70 - 99 mg/dL  Glucose, capillary     Status: None   Collection Time: 12/25/17 12:12 PM  Result Value Ref Range   Glucose-Capillary 80 70 - 99 mg/dL  Glucose, capillary     Status: Abnormal   Collection Time: 12/25/17  4:42 PM  Result Value Ref Range   Glucose-Capillary 212 (H) 70 - 99 mg/dL  Glucose, capillary     Status: Abnormal   Collection Time: 12/25/17  8:47 PM  Result Value Ref Range   Glucose-Capillary 144 (H) 70 - 99 mg/dL  CBC     Status: Abnormal   Collection Time: 12/26/17  5:24 AM  Result Value Ref Range   WBC 25.6 (H) 3.8 - 10.6 K/uL   RBC 3.02 (L)  4.40 - 5.90 MIL/uL   Hemoglobin 8.1 (L) 13.0 - 18.0 g/dL   HCT 24.5 (L) 40.0 - 52.0 %   MCV 81.2 80.0 - 100.0 fL   MCH 26.8 26.0 - 34.0 pg   MCHC 33.0 32.0 - 36.0 g/dL   RDW 16.5 (H) 11.5 - 14.5 %   Platelets 418 150 - 440 K/uL    Comment: Performed at [facility], [address] [place], [place] [address]  Glucose, capillary  Status: Abnormal   Collection Time: 12/26/17  8:00 AM  Result Value Ref Range   Glucose-Capillary 125 (H) 70 - 99 mg/dL  Glucose, capillary     Status: None   Collection Time: 12/26/17 12:22 PM  Result Value Ref Range   Glucose-Capillary 90 70 - 99 mg/dL  Glucose, capillary     Status: Abnormal   Collection Time: 12/26/17  5:00 PM  Result Value Ref Range   Glucose-Capillary 101 (H) 70 - 99 mg/dL   No results found.  Assessment:  Jelani Trueba. is a 72 y.o. 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 He presented with failure to thrive.  He has lost a significant amount of weight.  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 There was left periaortic lymph node enlargement.  There were numerous additional lesions in the lumbar spine. Findings were consistent with metastatic disease. Mass lesion extended into the left psoas muscle. There was central nonenhancing necrosis in the left psoas muscle most likely due to tumor. Psoas abscess could have a similar appearance.  Chest, abdomen, and pelvic CT on 12/16/2017 revealed ill-defined areas of sclerosis throughout the visualized spine and in the sternum, pathologic compression fracture of L4, adenopathy, most evident in the right axilla, and ill-defined liver lesions. There are no lung masses.    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  PSA was 3.65 on 12/14/2017.  LDH was 214  (slightly elevated).  Anemia workup included the following normal labs: ferritin (122), folate, and B12.  Iron saturation was 9% with a TIBC of 187. Retic was 1.7% (low).  Symptomatically, his back pain has improved.  Exam is stable.  Plan:   1.  Oncology:  [facility] unable to provide a diagnosis from the lymph node biopsy.  An excisional biopsy is needed.  Discussed with patient.  He adamantly refuses an excisional lymph node biopsy.  He want to go home.  Findings reviewed with patient's daughter.    Patient's daughter does not think he is able to make decisions for himself.  She questions his competency based on things she has said to him.  2.  Social:  Patient will likely require placement in a skilled nursing facility.  He appaears unable to care for himself at home.  3.  Code status:  DNR.   Lequita Asal, MD  12/26/2017, 8:17 PM

## 2017-12-27 DIAGNOSIS — R21 Rash and other nonspecific skin eruption: Secondary | ICD-10-CM | POA: Diagnosis not present

## 2017-12-27 DIAGNOSIS — Z515 Encounter for palliative care: Secondary | ICD-10-CM | POA: Diagnosis not present

## 2017-12-27 DIAGNOSIS — D649 Anemia, unspecified: Secondary | ICD-10-CM | POA: Diagnosis not present

## 2017-12-27 DIAGNOSIS — G894 Chronic pain syndrome: Secondary | ICD-10-CM | POA: Diagnosis not present

## 2017-12-27 DIAGNOSIS — M791 Myalgia, unspecified site: Secondary | ICD-10-CM | POA: Diagnosis not present

## 2017-12-27 DIAGNOSIS — C799 Secondary malignant neoplasm of unspecified site: Secondary | ICD-10-CM | POA: Diagnosis not present

## 2017-12-27 DIAGNOSIS — Z7401 Bed confinement status: Secondary | ICD-10-CM | POA: Diagnosis not present

## 2017-12-27 DIAGNOSIS — C801 Malignant (primary) neoplasm, unspecified: Secondary | ICD-10-CM | POA: Diagnosis not present

## 2017-12-27 DIAGNOSIS — G8929 Other chronic pain: Secondary | ICD-10-CM | POA: Diagnosis not present

## 2017-12-27 DIAGNOSIS — E876 Hypokalemia: Secondary | ICD-10-CM | POA: Diagnosis not present

## 2017-12-27 DIAGNOSIS — K5900 Constipation, unspecified: Secondary | ICD-10-CM | POA: Diagnosis not present

## 2017-12-27 DIAGNOSIS — E119 Type 2 diabetes mellitus without complications: Secondary | ICD-10-CM | POA: Diagnosis not present

## 2017-12-27 DIAGNOSIS — R262 Difficulty in walking, not elsewhere classified: Secondary | ICD-10-CM | POA: Diagnosis not present

## 2017-12-27 DIAGNOSIS — L408 Other psoriasis: Secondary | ICD-10-CM | POA: Diagnosis not present

## 2017-12-27 DIAGNOSIS — M549 Dorsalgia, unspecified: Secondary | ICD-10-CM | POA: Diagnosis not present

## 2017-12-27 DIAGNOSIS — A419 Sepsis, unspecified organism: Secondary | ICD-10-CM | POA: Diagnosis not present

## 2017-12-27 LAB — GLUCOSE, CAPILLARY
GLUCOSE-CAPILLARY: 86 mg/dL (ref 70–99)
Glucose-Capillary: 103 mg/dL — ABNORMAL HIGH (ref 70–99)

## 2017-12-27 LAB — CBC
HEMATOCRIT: 24.8 % — AB (ref 40.0–52.0)
HEMOGLOBIN: 7.8 g/dL — AB (ref 13.0–18.0)
MCH: 25.7 pg — AB (ref 26.0–34.0)
MCHC: 31.5 g/dL — ABNORMAL LOW (ref 32.0–36.0)
MCV: 81.8 fL (ref 80.0–100.0)
PLATELETS: 425 10*3/uL (ref 150–440)
RBC: 3.03 MIL/uL — AB (ref 4.40–5.90)
RDW: 16.8 % — ABNORMAL HIGH (ref 11.5–14.5)
WBC: 27.1 10*3/uL — AB (ref 3.8–10.6)

## 2017-12-27 LAB — SEDIMENTATION RATE: SED RATE: 51 mm/h — AB (ref 0–20)

## 2017-12-27 LAB — PROCALCITONIN: Procalcitonin: 7.19 ng/mL

## 2017-12-27 MED ORDER — NEPRO/CARBSTEADY PO LIQD: 237.0000 mL | Freq: Three times a day (TID) | ORAL | 0 refills | Status: AC

## 2017-12-27 MED ORDER — ASCORBIC ACID 250 MG PO TABS: 250.0000 mg | ORAL_TABLET | Freq: Two times a day (BID) | ORAL | Status: AC

## 2017-12-27 MED ORDER — OXYCODONE HCL ER 15 MG PO T12A: 15.0000 mg | EXTENDED_RELEASE_TABLET | Freq: Two times a day (BID) | ORAL | 0 refills | Status: AC

## 2017-12-27 MED ORDER — SENNOSIDES-DOCUSATE SODIUM 8.6-50 MG PO TABS: 2.0000 | ORAL_TABLET | Freq: Every day | ORAL | 1 refills | Status: AC | PRN

## 2017-12-27 MED ORDER — OXYCODONE-ACETAMINOPHEN 5-325 MG PO TABS: 1.0000 | ORAL_TABLET | ORAL | 0 refills | Status: AC | PRN

## 2017-12-27 MED ORDER — FAMOTIDINE 20 MG PO TABS
20.0000 mg | ORAL_TABLET | Freq: Two times a day (BID) | ORAL | Status: DC
  Administered 2017-12-27: 10:00:00 20 mg via ORAL
  Filled 2017-12-27: qty 1

## 2017-12-27 NOTE — Progress Notes (Signed)
New referral for outpatient Palliative to follow at [facility] received from [place]. Plan is for discharge today. Patient information faxed to referral. Flo Shanks RN, BSN, [facility] [place], hospital liaison [phone]

## 2017-12-27 NOTE — Progress Notes (Addendum)
Was unable to assess much of the patient because he was resistant to care and taking medications.  after pain medication was given, said nurse was able to convince pt of bath and change linens that were completely soaked with urine. Fever broke and came down to 98.2. Condom catheter was applied.

## 2017-12-27 NOTE — Discharge Summary (Signed)
[facility] [place] NAME: Stephen [name]    MR#:  [id number]  DATE OF BIRTH:  [birth year]  DATE OF ADMISSION:  12/14/2017 ADMITTING PHYSICIAN: Henreitta Leber, MD  DATE OF DISCHARGE: 12/27/2017  PRIMARY CARE PHYSICIAN: Glean Hess, MD    ADMISSION DIAGNOSIS:  Back pain [M54.9] Sepsis, due to unspecified organism [facility]) [A41.9]  DISCHARGE DIAGNOSIS:  Active Problems:      Metastatic disease ([place])     Sepsis ([place])   Palliative care encounter   Back pain   SECONDARY DIAGNOSIS:   Past Medical History:  Diagnosis Date  . Diabetes mellitus without complication ([place])   . Major depressive disorder, single episode, unspecified 07/18/2017  . Psoriasis   . Skin abnormalities     HOSPITAL COURSE:  72 year old male with a history of depression and diabetes who is brought due to generalized weakness and back pain  1.  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 Rudene Christians MRI of the spine showed psoas muscle involvement with tumor versus abscess Repeat MRI on September 13 does not show evidence of psoas muscle abscess.  Patient is on pain medications due to back pain with metastatic disease.  2.  Sepsis with left psoas muscle edema with fevers and healthcare associated pneumonia Sepsis was initial diagnosis however fevers may be coming from tumor and not infection. Patient has been adequately treated for pneumonia. He had MSSA from wound culture which is also adequately treated.  Patient and daughter have decided on a more comfort care approach.  Patient will be discharged with outpatient palliative care services and pain medications to control the pain.   He does not want any aggressive treatment nor would he be able to physically tolerate chemotherapy or radiation if this would even be an option.   He should not be brought back to the emergency room due to fevers.  Fevers  are likely from underlying tumor.  Daughter agrees with this plan.   3. Nausea and vomiting improved He will need to be on a dysphagia level 1 diet  4.  Metastatic disease of unknown primary: Patient had successful ultrasound-guided biopsy of right axillary lymph node by IR  Patient has deferred further work-up. Daughter concurs with this  5.  Anemia of chronic disease:  6.  Severe protein calorie malnutrition: Continue nutritional supplements   Patient is DNR and will have outpatient palliative care follow-up transition to hospice when indicated.    DISCHARGE CONDITIONS AND DIET:   Guarded condition on dysphagia 1 diet with nutritional supplements  CONSULTS OBTAINED:  Treatment Team:  Lequita Asal, MD Tsosie Billing, MD Hessie Knows, MD  DRUG ALLERGIES:  No Known Allergies  DISCHARGE MEDICATIONS:   Allergies as of 12/27/2017   No Known Allergies     Medication List    STOP taking these medications   baclofen 10 MG tablet Commonly known as:  LIORESAL   glucose blood test strip   insulin aspart 100 UNIT/ML FlexPen Commonly known as:  NOVOLOG   metFORMIN 500 MG tablet Commonly known as:  GLUCOPHAGE   Pen Needles 31G X 5 MM Misc     TAKE these medications   ascorbic acid 250 MG tablet Commonly known as:  VITAMIN C Take 1 tablet (250 mg total) by mouth 2 (two) times daily.   diphenhydrAMINE 25 mg capsule Commonly known as:  BENADRYL TAKE 1 CAPSULE (25  MG TOTAL) BY MOUTH EVERY 4 (FOUR) HOURS AS NEEDED FOR ITCHING.   feeding supplement (NEPRO CARB STEADY) Liqd Take 237 mLs by mouth 3 (three) times daily between meals.   naproxen sodium 220 MG tablet Commonly known as:  ALEVE Take 440 mg by mouth daily as needed.   oxyCODONE 15 mg 12 hr tablet Commonly known as:  OXYCONTIN Take 1 tablet (15 mg total) by mouth every 12 (twelve) hours.   oxyCODONE-acetaminophen 5-325 MG tablet Commonly known as:  PERCOCET/ROXICET Take 1-2 tablets  by mouth every 4 (four) hours as needed for moderate pain or severe pain.   senna-docusate 8.6-50 MG tablet Commonly known as:  Senokot-S Take 2 tablets by mouth daily as needed for mild constipation.   triamcinolone ointment 0.1 % Commonly known as:  KENALOG Apply 2 application topically 2 (two) times daily.         Today   CHIEF COMPLAINT:    patient continues to have fevers   VITAL SIGNS:  Blood pressure 136/79, pulse 81, temperature 99.4 F (37.4 C), resp. rate 17, height 5\' 9"  (1.753 m), weight 40.8 kg, SpO2 96 %.   REVIEW OF SYSTEMS:  Review of Systems  Constitutional: Positive for fever. Negative for chills and malaise/fatigue.  HENT: Negative.  Negative for ear discharge, ear pain, hearing loss, nosebleeds and sore throat.   Eyes: Negative.  Negative for blurred vision and pain.  Respiratory: Negative.  Negative for cough, hemoptysis, shortness of breath and wheezing.   Cardiovascular: Negative.  Negative for chest pain, palpitations and leg swelling.  Gastrointestinal: Negative.  Negative for abdominal pain, blood in stool, diarrhea, nausea and vomiting.  Genitourinary: Negative.  Negative for dysuria.  Musculoskeletal: Positive for back pain and joint pain.  Skin: Negative.   Neurological: Negative for dizziness, tremors, speech change, focal weakness, seizures and headaches.  Endo/Heme/Allergies: Negative.  Does not bruise/bleed easily.  Psychiatric/Behavioral: Negative.  Negative for depression, hallucinations and suicidal ideas.     PHYSICAL EXAMINATION:  GENERAL:  72 y.o.-year-old patient lying in the bed with no acute distress.  In and frail NECK:  Supple, no jugular venous distention. No thyroid enlargement, no tenderness.  LUNGS: Normal breath sounds bilaterally, no wheezing, rales,rhonchi  No use of accessory muscles of respiration.  CARDIOVASCULAR: S1, S2 normal. No murmurs, rubs, or gallops.  ABDOMEN: Soft, non-tender, non-distended. Bowel sounds  present. No organomegaly or mass.  EXTREMITIES: No pedal edema, cyanosis, or clubbing.  PSYCHIATRIC: The patient is alert and oriented x 3.  SKIN: No obvious rash, lesion, or ulcer.   DATA REVIEW:   CBC Recent Labs  Lab 12/27/17 0358  WBC 27.1*  HGB 7.8*  HCT 24.8*  PLT 425    Chemistries  Recent Labs  Lab 12/25/17 0540  NA 141  K 4.2  CL 108  CO2 25  GLUCOSE 158*  BUN 19  CREATININE 0.84  CALCIUM 8.6*    Cardiac Enzymes No results for input(s): TROPONINI in the last 168 hours.  Microbiology Results  @MICRORSLT48 @  RADIOLOGY:  No results found.    Allergies as of 12/27/2017   No Known Allergies     Medication List    STOP taking these medications   baclofen 10 MG tablet Commonly known as:  LIORESAL   glucose blood test strip   insulin aspart 100 UNIT/ML FlexPen Commonly known as:  NOVOLOG   metFORMIN 500 MG tablet Commonly known as:  GLUCOPHAGE   Pen Needles 31G X 5 MM Misc  TAKE these medications   ascorbic acid 250 MG tablet Commonly known as:  VITAMIN C Take 1 tablet (250 mg total) by mouth 2 (two) times daily.   diphenhydrAMINE 25 mg capsule Commonly known as:  BENADRYL TAKE 1 CAPSULE (25 MG TOTAL) BY MOUTH EVERY 4 (FOUR) HOURS AS NEEDED FOR ITCHING.   feeding supplement (NEPRO CARB STEADY) Liqd Take 237 mLs by mouth 3 (three) times daily between meals.   naproxen sodium 220 MG tablet Commonly known as:  ALEVE Take 440 mg by mouth daily as needed.   oxyCODONE 15 mg 12 hr tablet Commonly known as:  OXYCONTIN Take 1 tablet (15 mg total) by mouth every 12 (twelve) hours.   oxyCODONE-acetaminophen 5-325 MG tablet Commonly known as:  PERCOCET/ROXICET Take 1-2 tablets by mouth every 4 (four) hours as needed for moderate pain or severe pain.   senna-docusate 8.6-50 MG tablet Commonly known as:  Senokot-S Take 2 tablets by mouth daily as needed for mild constipation.   triamcinolone ointment 0.1 % Commonly known as:   KENALOG Apply 2 application topically 2 (two) times daily.           Management plans discussed with the patient and he is in agreement. Stable for discharge with palliative care  Patient should follow up with palliative care and PCP  CODE STATUS:     Code Status Orders  (From admission, onward)         Start     Ordered   12/19/17 1005  Do not attempt resuscitation (DNR)  Continuous    Question Answer Comment  In the event of cardiac or respiratory ARREST Do not call a "code blue"   In the event of cardiac or respiratory ARREST Do not perform Intubation, CPR, defibrillation or ACLS   In the event of cardiac or respiratory ARREST Use medication by any route, position, wound care, and other measures to relive pain and suffering. May use oxygen, suction and manual treatment of airway obstruction as needed for comfort.      12/19/17 1004        Code Status History    Date Active Date Inactive Code Status Order ID Comments User Context   12/14/2017 1621 12/19/2017 1004 Full Code [id number]  Henreitta Leber, MD Inpatient   11/07/2015 1753 11/09/2015 1954 Full Code [id number]  Henreitta Leber, MD Inpatient   05/01/2015 1918 05/03/2015 1707 Full Code [id number]  Aldean Jewett, MD Inpatient   12/14/2014 1035 12/15/2014 1549 Full Code [id number]  Theodoro Grist, MD Inpatient      TOTAL TIME TAKING CARE OF THIS PATIENT: 40 minutes.    Note: This dictation was prepared with Dragon dictation along with smaller phrase technology. Any transcriptional errors that result from this process are unintentional.  Demetry Bendickson M.D on 12/27/2017 at 10:49 AM  Between 7am to 6pm - Pager - [phone] After 6pm go to [email or web address] - password [email or web address] [place] [facility]  Office  [phone]  CC: Primary care physician; Glean Hess, MD

## 2017-12-27 NOTE — Progress Notes (Addendum)
ID Discussed with Dr.Mody regarding the patient's ongoing fever. He has been in the hospital since 12/24/17 and was on broad spectrum antibiotics since 9/4 until 9/14. No response to antibiotics-Blood culture negative The FNA of the rt axillary LN was not diagnostic of malignancy( lymphoma)  He did not want anything aggressive like excisional biopsy. Also the vertebral tumor with psoas extension could not be biopsied- There was no fluid to aspirate as per radiologist and repeat MRI done on 9/13 did not show any change in the psoas mass nor evolution to abscess. eventhough the study was done without contrast. Likely the source of fever could be from the malignancy Hopsitalist discussed with Daughter and palliative/hospice care is being pursued. Pt is being transferred to [facility]. Called his daughter [name] and discussed his condition with her at 4pm

## 2017-12-27 NOTE — Progress Notes (Signed)
Patient discharged to [facility] per MD order. Report given to Maudie Mercury at facility. EMS called for transport.

## 2017-12-27 NOTE — Progress Notes (Signed)
[place] at [place] NAME: Stephen [name]    MR#:  [id number]  DATE OF BIRTH:  03-Nov-1945  SUBJECTIVE:  + fever overnight off of ABX   REVIEW OF SYSTEMS:    Review of Systems  Constitutional; ++fever, has weight loss HENT: Negative for ear pain, nosebleeds, congestion, facial swelling, rhinorrhea, neck pain, neck stiffness and ear discharge.   Respiratory: Negative for cough, shortness of breath, wheezing  Cardiovascular: Negative for chest pain, palpitations and leg swelling.  Gastrointestinal: Negative for heartburn, abdominal pain,  diarrhea or consitpation Has vomiting Genitourinary: Negative for dysuria, urgency, frequency, hematuria Musculoskeletal: Has lower back pain improving Neurological: Negative for dizziness, seizures, syncope, focal weakness,  numbness and headaches.  Hematological: Does not bruise/bleed easily.  Psychiatric/Behavioral: Negative for hallucinations, confusion, dysphoric mood  Tolerating Diet: yes   DRUG ALLERGIES:  No Known Allergies  VITALS:  Blood pressure 136/79, pulse 81, temperature 99.4 F (37.4 C), resp. rate 17, height 5\' 9"  (1.753 m), weight 40.8 kg, SpO2 96 %.  PHYSICAL EXAMINATION:  Constitutional: Appears thin and frail no distress. HENT: Normocephalic. Marland Kitchen Oropharynx is clear and moist.  Eyes: Conjunctivae and EOM are normal. PERRLA, no scleral icterus.  Neck: Normal ROM. Neck supple. No JVD. No tracheal deviation. CVS: RRR, S1/S2 +, no murmurs, no gallops, no carotid bruit.  Pulmonary: Effort and breath sounds normal, no stridor, rhonchi, wheezes, Scattered rales in left lung  Abdominal: Soft. BS +,  no distension, tenderness, rebound or guarding.  Musculoskeletal: Normal range of motion. No edema  Lower back tenderness Neuro: Alert. CN 2-12 grossly intact. No focal deficits. Skin: Skin is warm and dry. No rash noted. Psychiatric: Normal mood and affect.      LABORATORY PANEL:    CBC Recent Labs  Lab 12/27/17 0358  WBC 27.1*  HGB 7.8*  HCT 24.8*  PLT 425   ------------------------------------------------------------------------------------------------------------------  Chemistries  Recent Labs  Lab 12/25/17 0540  NA 141  K 4.2  CL 108  CO2 25  GLUCOSE 158*  BUN 19  CREATININE 0.84  CALCIUM 8.6*   ------------------------------------------------------------------------------------------------------------------  Cardiac Enzymes No results for input(s): TROPONINI in the last 168 hours. ------------------------------------------------------------------------------------------------------------------  RADIOLOGY:  No results found.   ASSESSMENT AND PLAN:   72 year old male with a history of depression and diabetes who is brought due to generalized weakness and back pain  1.  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 Rudene Christians MRI of the spine showed psoas muscle involvement with tumor versus abscess Repeat MRI on September 13 does not show evidence of psoas muscle abscess.   2.  Sepsis with left psoas muscle edema with fevers and healthcare associated pneumonia Sepsis was initial diagnosis however fevers may be coming from tumor and not infection. Patient has been adequately treated for pneumonia. He had MSSA from wound culture which is also adequately treated. D/w ID and oncology today patient may need excisional biopsy but he does not want this ID to speak with radiology today about PSOAS biopsy  3. Nausea and vomiting improved Dysphagia level 1 diet  4.  Metastatic disease of unknown primary: Patient had successful ultrasound-guided biopsy of right axillary lymph node by IR  It appears this may be T-cell pathology as per oncology evaluation. Now needs excisional biopsy as results seem inconclusive.   5.  Anemia of chronic disease: Continue to monitor hemoglobin Globin  7.8 today.  Continue to  monitor no indication for blood transfusion  6.  Severe protein calorie malnutrition: Continue nutritional supplements   Patient is DNR and will have outpatient palliative care follow-up Palliative care consultation appreciated   Management plans discussed with the patient and he is in agreement.  CODE STATUS: DNR  TOTAL TIME TAKING CARE OF THIS PATIENT: 22  minutes.   PHYSICAL therapy recommending SNF Family in agreeement  CSW consult placed   POSSIBLE D/C 1 to 2 days DEPENDING ON CLINICAL CONDITION.   Eiko Mcgowen M.D on 12/27/2017 at 10:01 AM  Between 7am to 6pm - Pager - [phone] After 6pm go to [email or web address] - password [email or web address] [place] [facility]  Office  [phone]  CC: Primary care physician; Glean Hess, MD  Note: This dictation was prepared with Dragon dictation along with smaller phrase technology. Any transcriptional errors that result from this process are unintentional.

## 2017-12-27 NOTE — Clinical Social Work Note (Signed)
Patient is medically ready for discharge today. CSW contacted patient's daughter Devarius Nelles [phone] and notified her of discharge to [facility] today. Patient will be transported by EMS. RN to call report and call for transport.   [place], [place]

## 2017-12-28 ENCOUNTER — Telehealth: Payer: Self-pay

## 2017-12-28 NOTE — Telephone Encounter (Signed)
Transition Care Management Follow-up Telephone Call  Date of discharge and from where: 12/27/17 from [facility]  How have you been since you were released from the hospital? States he has been admitted to SNF. Still having ongoing fever. Denies N/V. Given metastatic disease, has agreed to palliative care   Any questions or concerns? No   Items Reviewed:  Did the pt receive and understand the discharge instructions provided? Yes   Medications obtained and verified? Yes   Any new allergies since your discharge? No   Dietary orders reviewed? Yes  Do you have support at home? Yes   Other (ie: DME, Home Health, etc) SNF  Functional Questionnaire: (I = Independent and D = Dependent) ADL's: D  Bathing/Dressing- D   Meal Prep- D  Eating- D  Maintaining continence- D  Transferring/Ambulation- D  Managing Meds- D   Follow up appointments reviewed:    PCP Hospital f/u appt confirmed? Pt has not yet scheduled hosp f/u appt with Dr. Lynnae Sandhoff f/u appt confirmed? N/A   Are transportation arrangements needed? N/A  If their condition worsens, is the pt aware to call  their PCP or go to the ED? N/A d/t palliative care  Was the patient provided with contact information for the PCP's office or ED? N/A d/t palliative care  Was the pt encouraged to call back with questions or concerns? Yes

## 2017-12-29 DIAGNOSIS — R21 Rash and other nonspecific skin eruption: Secondary | ICD-10-CM | POA: Diagnosis not present

## 2017-12-29 DIAGNOSIS — E119 Type 2 diabetes mellitus without complications: Secondary | ICD-10-CM | POA: Diagnosis not present

## 2017-12-29 DIAGNOSIS — G8929 Other chronic pain: Secondary | ICD-10-CM | POA: Diagnosis not present

## 2018-01-02 DIAGNOSIS — Z515 Encounter for palliative care: Secondary | ICD-10-CM | POA: Diagnosis not present

## 2018-01-03 DIAGNOSIS — C801 Malignant (primary) neoplasm, unspecified: Secondary | ICD-10-CM | POA: Diagnosis not present

## 2018-01-03 DIAGNOSIS — E119 Type 2 diabetes mellitus without complications: Secondary | ICD-10-CM | POA: Diagnosis not present

## 2018-01-03 DIAGNOSIS — K5900 Constipation, unspecified: Secondary | ICD-10-CM | POA: Diagnosis not present

## 2018-01-03 DIAGNOSIS — M791 Myalgia, unspecified site: Secondary | ICD-10-CM | POA: Diagnosis not present

## 2018-02-10 DEATH — deceased

## 2018-09-16 IMAGING — DX DG CHEST 1V
2 series · 2 of 2 positions shown · non-contrast
Comparison: Chest x-ray of 12/21/2016

CLINICAL DATA: Fever began today, history of diabetes

EXAM:
CHEST  1 VIEW

[chest ap]
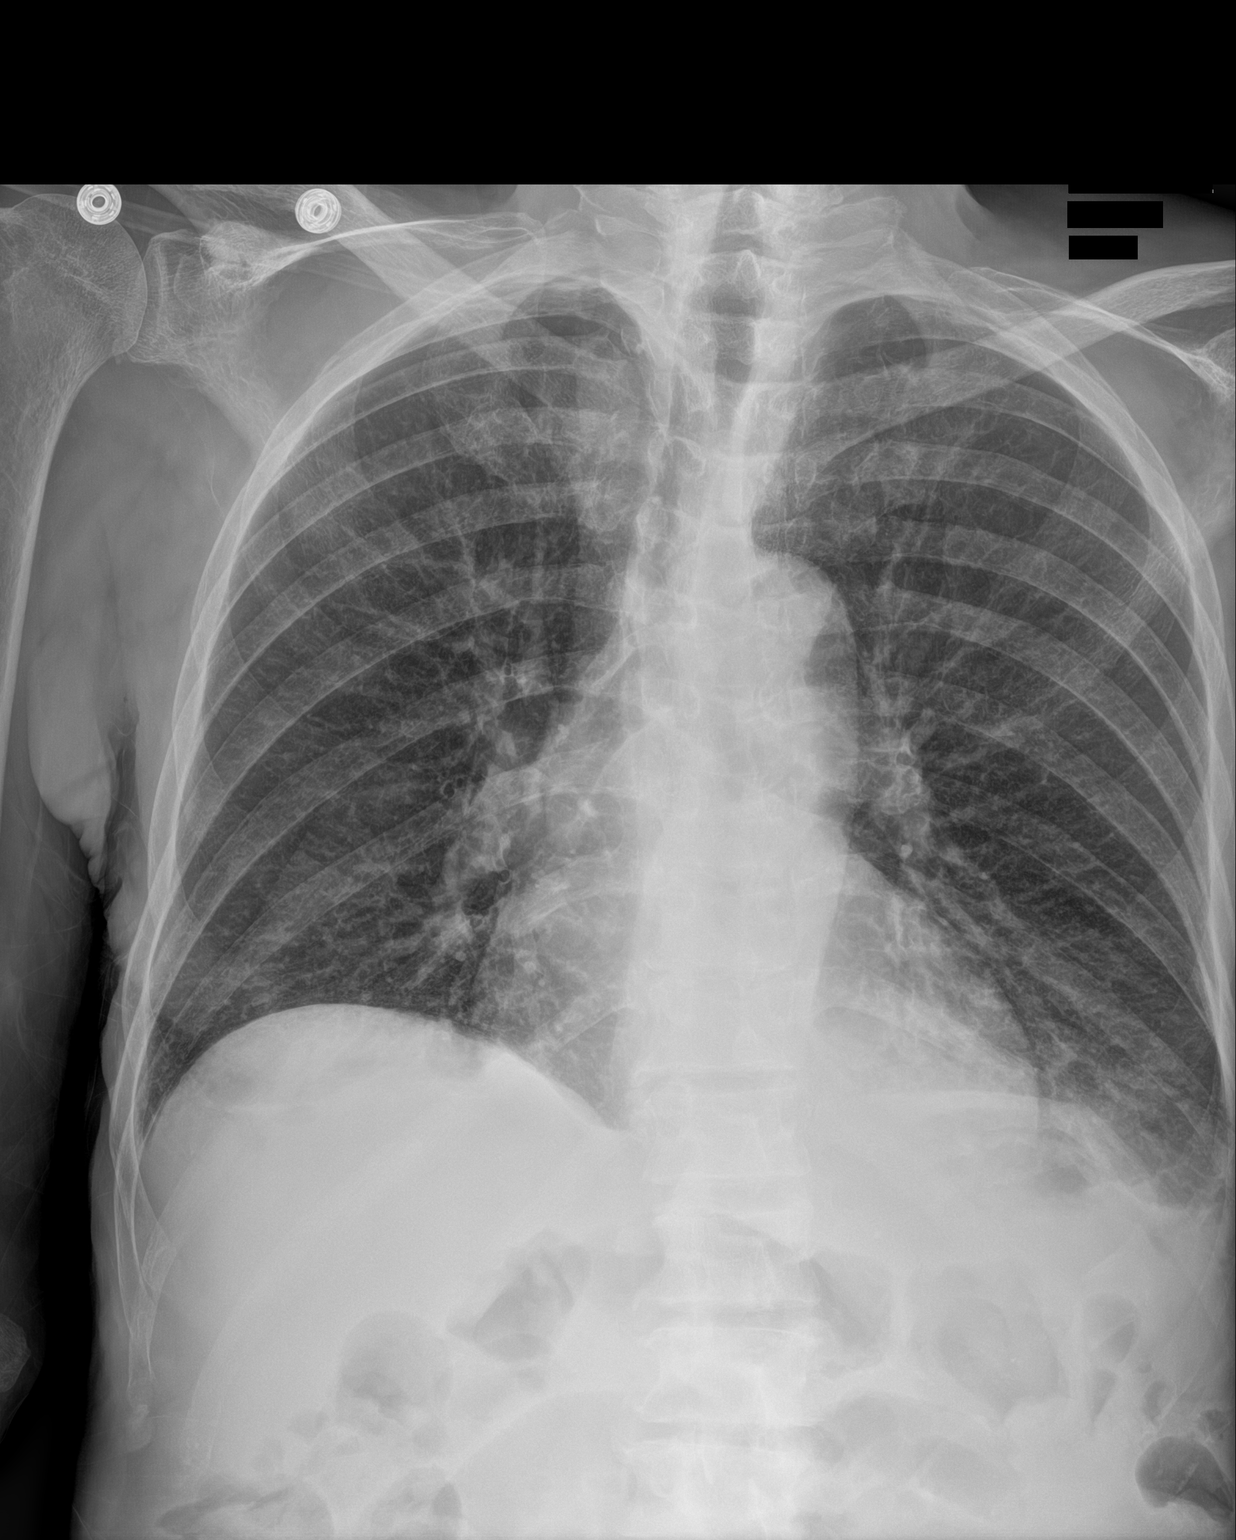

[chest pa]
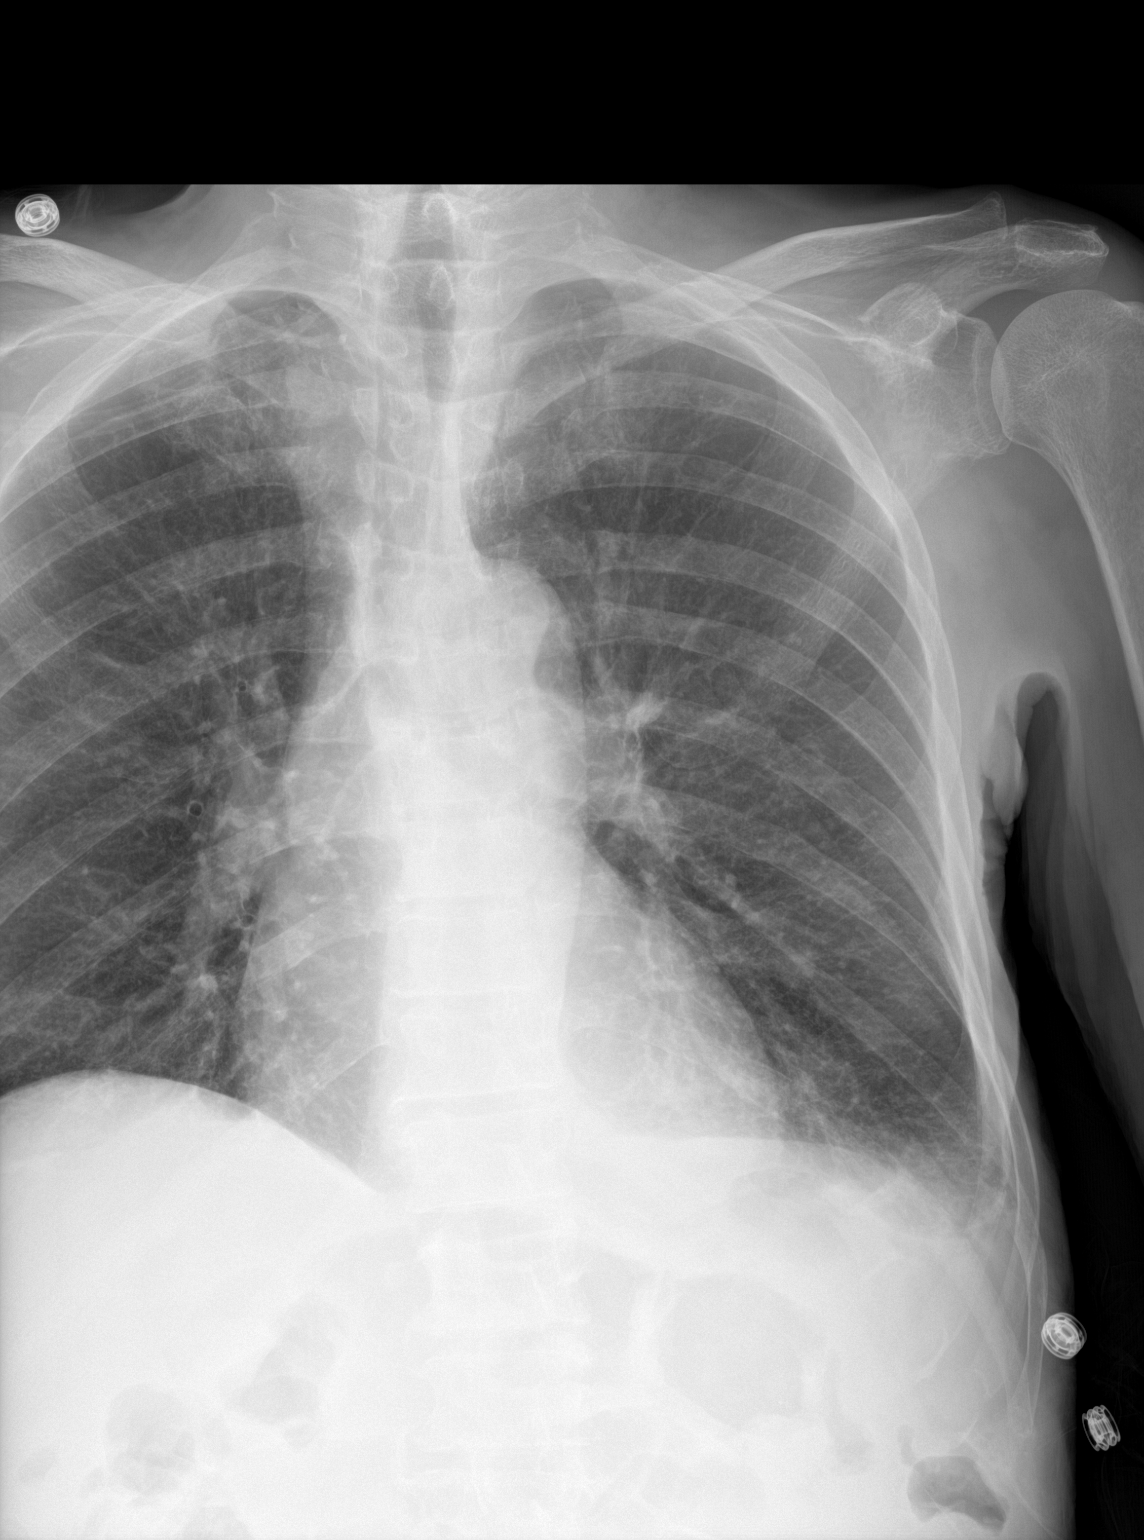

[2 of 2 positions shown; findings below may reference images not displayed]

FINDINGS: There is now haziness at the left lung base consistent with patchy
pneumonia and probable small left pleural effusion. The right lung
is clear. Mediastinal and hilar contours are unremarkable and the
heart is within upper limits. No acute bony abnormality is seen.
IMPRESSION: Parenchymal opacity at the left lung base consistent with patchy
left lower lobe pneumonia and small left pleural effusion.

## 2018-10-11 ENCOUNTER — Ambulatory Visit: Payer: Self-pay

## 2019-11-08 IMAGING — US US BIOPSY LYMPH NODE
1 series · 13 of 21 positions shown · non-contrast
Comparison: CT of the chest, abdomen and pelvis - 12/16/2017

INDICATION: No known primary, now with imaging findings worrisome for advanced
metastatic disease. Please perform ultrasound-guided biopsy of right
axillary nodal conglomeration for tissue diagnostic purposes.

EXAM:
ULTRASOUND-GUIDED RIGHT AXILLARY LYMPH NODE BIOPSY
TECHNIQUE: Informed written consent was obtained from the patient after a
discussion of the risks, benefits and alternatives to treatment.
Questions regarding the procedure were encouraged and answered.
Initial ultrasound scanning demonstrated multiple enlarged right
axillary lymph nodes. An ultrasound image was saved for
documentation purposes. The procedure was planned. A timeout was
performed prior to the initiation of the procedure.

[Series 1: us biopsy lymph node · 0.07mm/px · 13 of 21 slices shown]
[im 1/21]
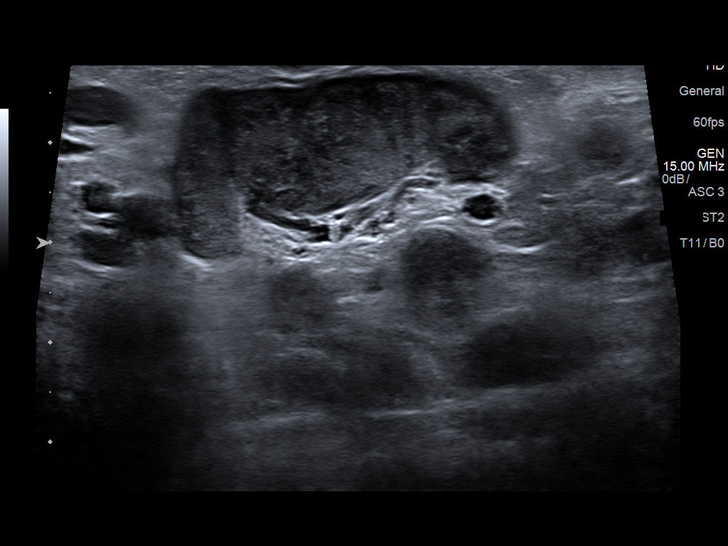
[im 3/21]
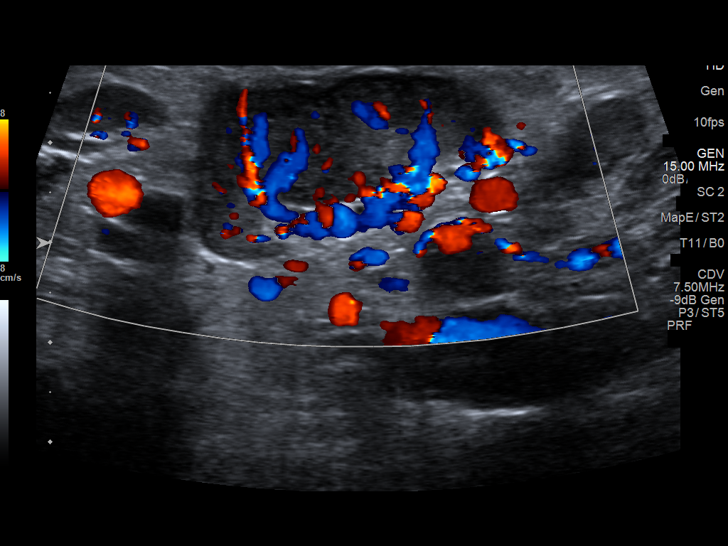
[im 5/21]
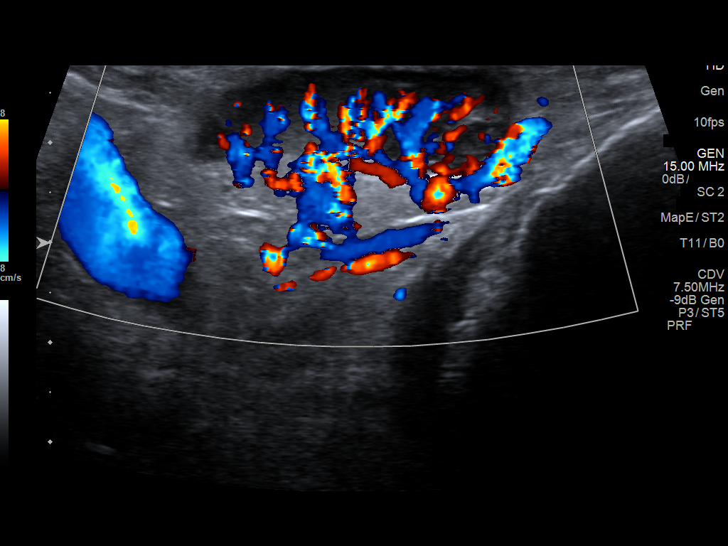
[im 6/21]
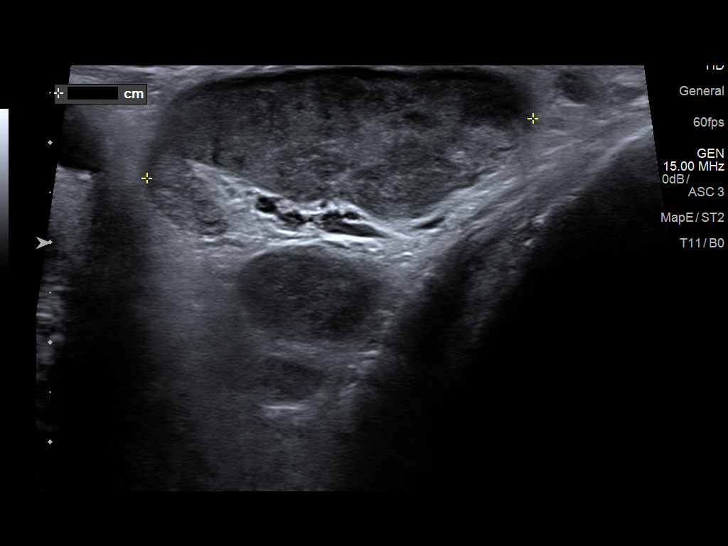
[im 8/21]
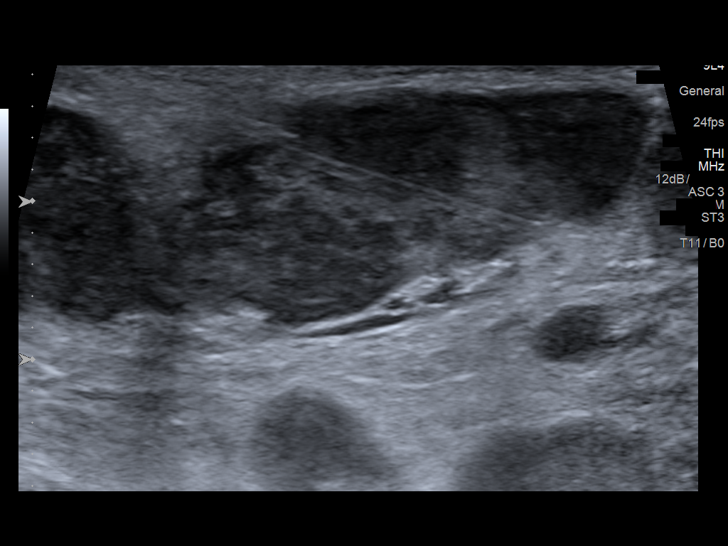
[im 9/21]
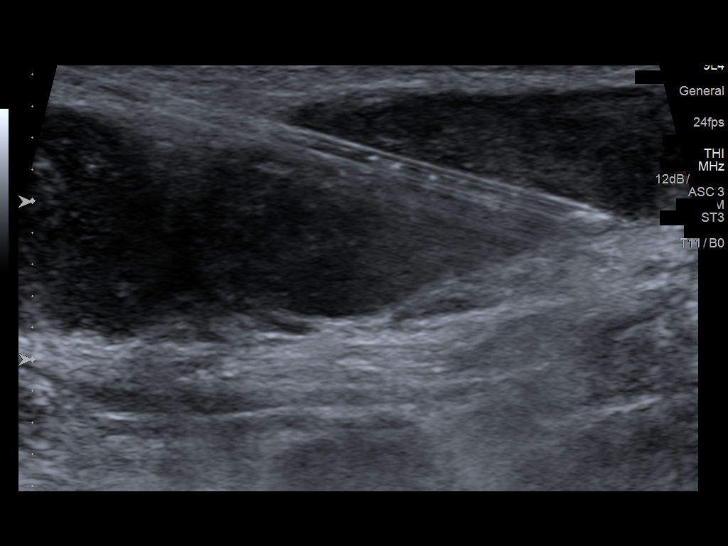
[im 11/21]
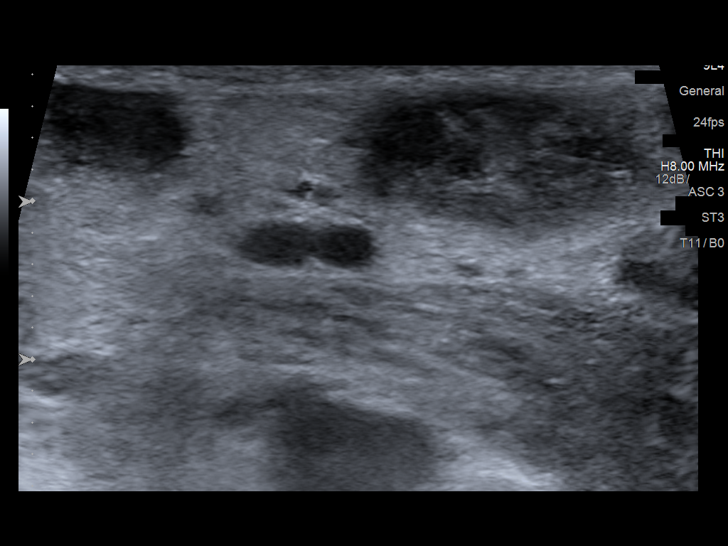
[im 13/21]
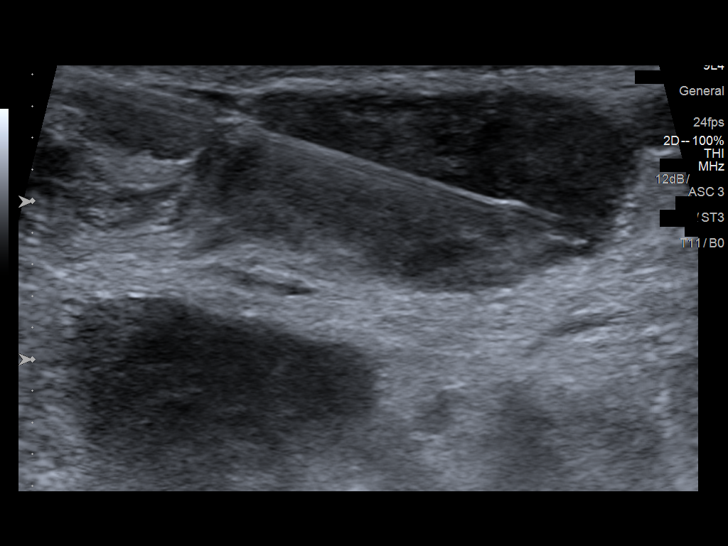
[im 14/21]
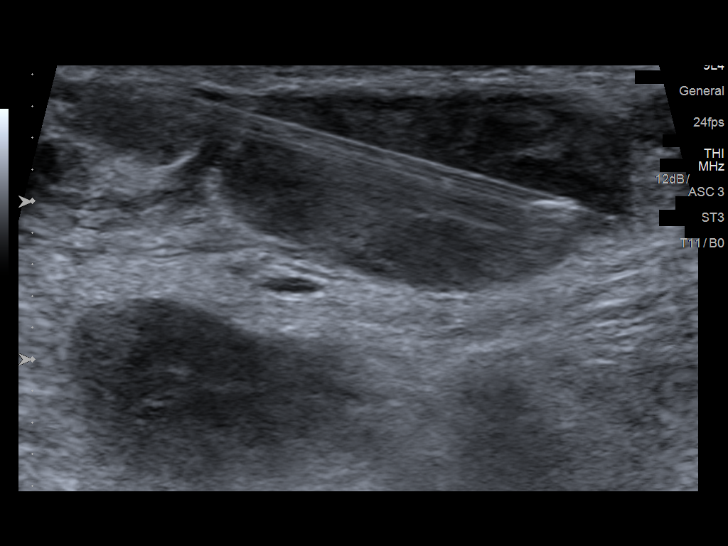
[im 16/21]
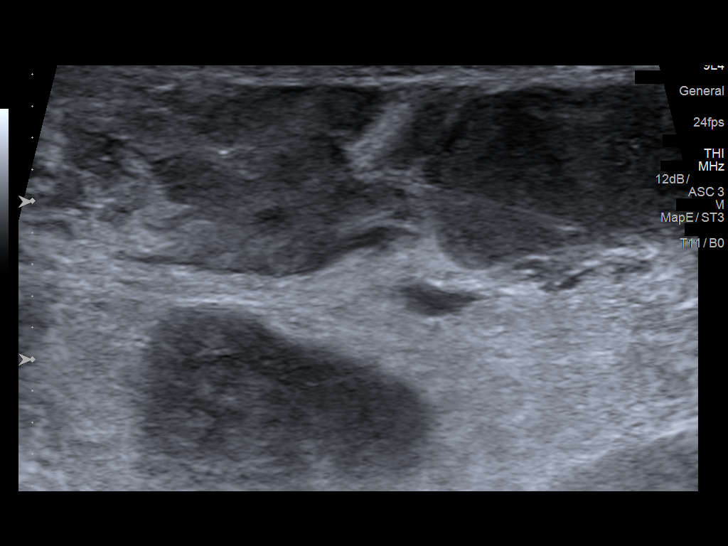
[im 17/21]
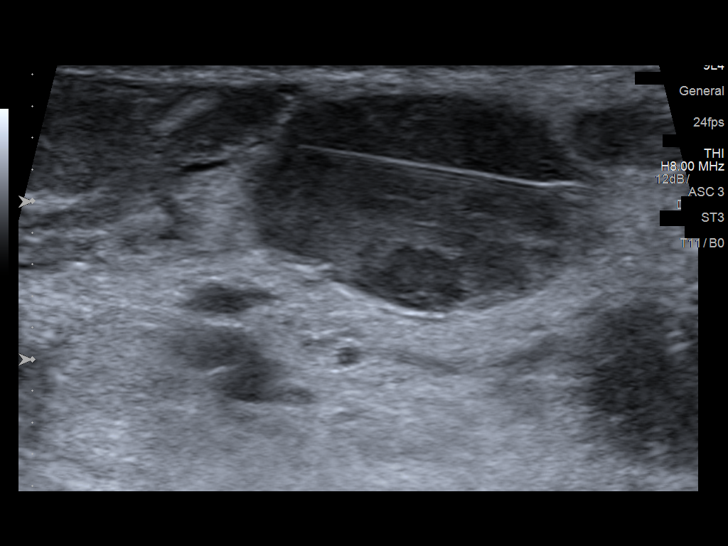
[im 19/21]
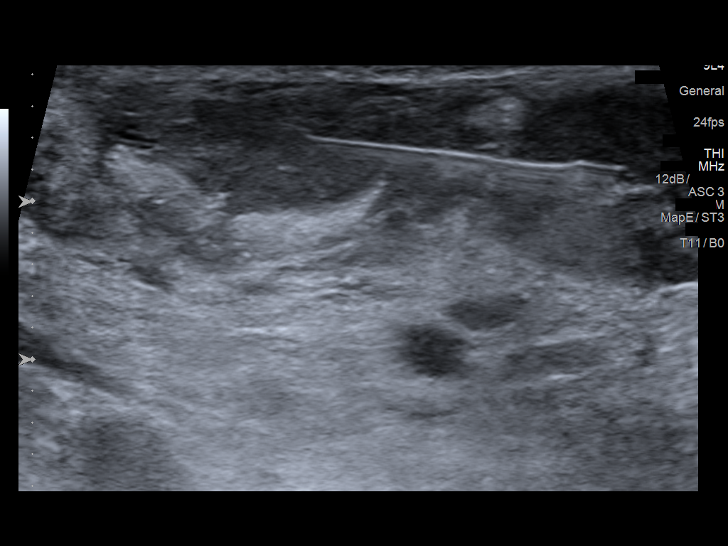
[im 21/21]
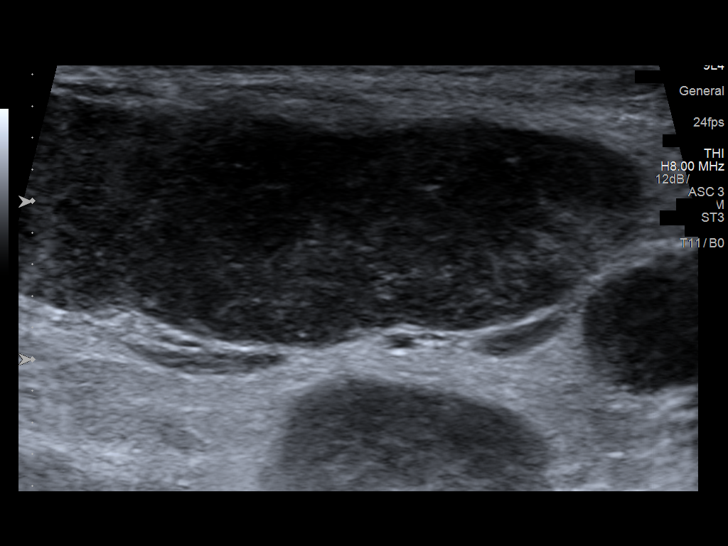

[13 of 21 positions shown; findings below may reference images not displayed]

MEDICATIONS:
None

ANESTHESIA/SEDATION:
Moderate (conscious) sedation was employed during this procedure. A
total of Versed 2 mg and Fentanyl 100 mcg was administered
intravenously.

Moderate Sedation Time: 21 minutes. The patient's level of
consciousness and vital signs were monitored continuously by
radiology nursing throughout the procedure under my direct
supervision.

COMPLICATIONS:
None immediate.
The operative was prepped and draped in the usual sterile fashion,
and a sterile drape was applied covering the operative field. A
timeout was performed prior to the initiation of the procedure.
Local anesthesia was provided with 1% lidocaine with epinephrine.

Under direct ultrasound guidance, an 18 gauge core needle device was
utilized to obtain to obtain 6 core needle biopsies of the dominant
right axillary nodal conglomeration.

The samples were placed in saline and submitted to pathology. The
needle was removed and hemostasis was achieved with manual
compression. Post procedure scan was negative for significant
hematoma. A dressing was placed. The patient tolerated the procedure
well without immediate postprocedural complication.
IMPRESSION: Technically successful ultrasound guided biopsy of dominant right
axillary nodal conglomeration.
